# Patient Record
Sex: Female | Born: 1935 | Race: White | Hispanic: No | Marital: Married | Smoking: Never smoker
Health system: Southern US, Community
[De-identification: ages and names within clinical notes are randomized; demographics above are authoritative.]

## PROBLEM LIST (undated history)

## (undated) DIAGNOSIS — B351 Tinea unguium: Secondary | ICD-10-CM

## (undated) DIAGNOSIS — E785 Hyperlipidemia, unspecified: Secondary | ICD-10-CM

## (undated) DIAGNOSIS — F0390 Unspecified dementia without behavioral disturbance: Secondary | ICD-10-CM

## (undated) DIAGNOSIS — E039 Hypothyroidism, unspecified: Secondary | ICD-10-CM

## (undated) DIAGNOSIS — K5730 Diverticulosis of large intestine without perforation or abscess without bleeding: Secondary | ICD-10-CM

## (undated) DIAGNOSIS — Z8781 Personal history of (healed) traumatic fracture: Secondary | ICD-10-CM

## (undated) DIAGNOSIS — L03116 Cellulitis of left lower limb: Secondary | ICD-10-CM

## (undated) DIAGNOSIS — G20A1 Parkinson's disease without dyskinesia, without mention of fluctuations: Secondary | ICD-10-CM

## (undated) DIAGNOSIS — I10 Essential (primary) hypertension: Secondary | ICD-10-CM

## (undated) DIAGNOSIS — R011 Cardiac murmur, unspecified: Secondary | ICD-10-CM

## (undated) DIAGNOSIS — G20 Parkinson's disease: Secondary | ICD-10-CM

## (undated) HISTORY — DX: Diverticulosis of large intestine without perforation or abscess without bleeding: K57.30

## (undated) HISTORY — DX: Hyperlipidemia, unspecified: E78.5

## (undated) HISTORY — PX: ABDOMINAL HYSTERECTOMY: SHX81

## (undated) HISTORY — DX: Cellulitis of left lower limb: L03.116

## (undated) HISTORY — DX: Parkinson's disease without dyskinesia, without mention of fluctuations: G20.A1

## (undated) HISTORY — DX: Hypothyroidism, unspecified: E03.9

## (undated) HISTORY — DX: Essential (primary) hypertension: I10

## (undated) HISTORY — DX: Cardiac murmur, unspecified: R01.1

## (undated) HISTORY — DX: Tinea unguium: B35.1

## (undated) HISTORY — DX: Parkinson's disease: G20

---

## 1998-08-10 ENCOUNTER — Other Ambulatory Visit: Admission: RE | Admit: 1998-08-10 | Discharge: 1998-08-10 | Payer: Self-pay | Admitting: Obstetrics & Gynecology

## 1999-08-11 ENCOUNTER — Other Ambulatory Visit: Admission: RE | Admit: 1999-08-11 | Discharge: 1999-08-11 | Payer: Self-pay | Admitting: Obstetrics & Gynecology

## 2001-11-03 ENCOUNTER — Other Ambulatory Visit: Admission: RE | Admit: 2001-11-03 | Discharge: 2001-11-03 | Payer: Self-pay | Admitting: Obstetrics & Gynecology

## 2002-12-02 ENCOUNTER — Other Ambulatory Visit: Admission: RE | Admit: 2002-12-02 | Discharge: 2002-12-02 | Payer: Self-pay | Admitting: Obstetrics & Gynecology

## 2002-12-07 ENCOUNTER — Encounter: Payer: Self-pay | Admitting: Internal Medicine

## 2003-03-23 ENCOUNTER — Encounter: Admission: RE | Admit: 2003-03-23 | Discharge: 2003-03-23 | Payer: Self-pay | Admitting: Internal Medicine

## 2003-04-08 ENCOUNTER — Encounter: Admission: RE | Admit: 2003-04-08 | Discharge: 2003-04-08 | Payer: Self-pay | Admitting: Internal Medicine

## 2004-01-26 ENCOUNTER — Ambulatory Visit: Payer: Self-pay | Admitting: Internal Medicine

## 2004-02-22 ENCOUNTER — Ambulatory Visit: Payer: Self-pay | Admitting: Internal Medicine

## 2004-06-22 ENCOUNTER — Ambulatory Visit: Payer: Self-pay | Admitting: Internal Medicine

## 2004-07-12 ENCOUNTER — Ambulatory Visit: Payer: Self-pay | Admitting: Internal Medicine

## 2004-07-27 ENCOUNTER — Ambulatory Visit: Payer: Self-pay | Admitting: Internal Medicine

## 2004-08-03 ENCOUNTER — Ambulatory Visit: Payer: Self-pay | Admitting: Internal Medicine

## 2004-08-08 ENCOUNTER — Encounter (INDEPENDENT_AMBULATORY_CARE_PROVIDER_SITE_OTHER): Payer: Self-pay | Admitting: *Deleted

## 2004-08-08 ENCOUNTER — Ambulatory Visit: Payer: Self-pay | Admitting: Internal Medicine

## 2004-08-08 LAB — HM COLONOSCOPY

## 2004-08-10 ENCOUNTER — Ambulatory Visit: Payer: Self-pay | Admitting: Internal Medicine

## 2004-10-03 ENCOUNTER — Ambulatory Visit: Payer: Self-pay | Admitting: Internal Medicine

## 2004-12-21 ENCOUNTER — Ambulatory Visit: Payer: Self-pay | Admitting: Internal Medicine

## 2004-12-28 ENCOUNTER — Other Ambulatory Visit: Admission: RE | Admit: 2004-12-28 | Discharge: 2004-12-28 | Payer: Self-pay | Admitting: Obstetrics & Gynecology

## 2005-01-25 ENCOUNTER — Ambulatory Visit: Payer: Self-pay | Admitting: Internal Medicine

## 2005-04-24 ENCOUNTER — Ambulatory Visit: Payer: Self-pay | Admitting: Internal Medicine

## 2005-06-06 ENCOUNTER — Ambulatory Visit: Payer: Self-pay | Admitting: Internal Medicine

## 2005-06-14 ENCOUNTER — Ambulatory Visit: Payer: Self-pay | Admitting: Internal Medicine

## 2005-09-05 ENCOUNTER — Ambulatory Visit: Payer: Self-pay | Admitting: Internal Medicine

## 2005-12-12 ENCOUNTER — Ambulatory Visit: Payer: Self-pay | Admitting: Internal Medicine

## 2005-12-12 DIAGNOSIS — K5730 Diverticulosis of large intestine without perforation or abscess without bleeding: Secondary | ICD-10-CM | POA: Insufficient documentation

## 2005-12-12 DIAGNOSIS — E785 Hyperlipidemia, unspecified: Secondary | ICD-10-CM

## 2005-12-12 DIAGNOSIS — E039 Hypothyroidism, unspecified: Secondary | ICD-10-CM

## 2005-12-12 DIAGNOSIS — I10 Essential (primary) hypertension: Secondary | ICD-10-CM | POA: Insufficient documentation

## 2006-04-15 ENCOUNTER — Ambulatory Visit: Payer: Self-pay | Admitting: Internal Medicine

## 2006-08-12 ENCOUNTER — Ambulatory Visit: Payer: Self-pay | Admitting: Internal Medicine

## 2006-08-12 LAB — CONVERTED CEMR LAB
ALT: 14 units/L (ref 0–40)
AST: 23 units/L (ref 0–37)
Albumin: 3.6 g/dL (ref 3.5–5.2)
Alkaline Phosphatase: 46 units/L (ref 39–117)
BUN: 11 mg/dL (ref 6–23)
Bilirubin, Direct: 0.1 mg/dL (ref 0.0–0.3)
CO2: 30 meq/L (ref 19–32)
Calcium: 9.5 mg/dL (ref 8.4–10.5)
Chloride: 93 meq/L — ABNORMAL LOW (ref 96–112)
Creatinine, Ser: 0.7 mg/dL (ref 0.4–1.2)
GFR calc Af Amer: 103 mL/min
GFR calc non Af Amer: 85 mL/min
Glucose, Bld: 76 mg/dL (ref 70–99)
Potassium: 3.5 meq/L (ref 3.5–5.1)
Sodium: 130 meq/L — ABNORMAL LOW (ref 135–145)
TSH: 3.36 microintl units/mL (ref 0.35–5.50)
Total Bilirubin: 0.9 mg/dL (ref 0.3–1.2)
Total Protein: 6.3 g/dL (ref 6.0–8.3)

## 2007-02-24 ENCOUNTER — Ambulatory Visit: Payer: Self-pay | Admitting: Internal Medicine

## 2007-02-25 LAB — CONVERTED CEMR LAB
BUN: 14 mg/dL (ref 6–23)
CO2: 31 meq/L (ref 19–32)
Calcium: 10.2 mg/dL (ref 8.4–10.5)
Chloride: 95 meq/L — ABNORMAL LOW (ref 96–112)
Cholesterol: 258 mg/dL (ref 0–200)
Creatinine, Ser: 1 mg/dL (ref 0.4–1.2)
Direct LDL: 175.8 mg/dL
GFR calc Af Amer: 68 mL/min
GFR calc non Af Amer: 56 mL/min
Glucose, Bld: 86 mg/dL (ref 70–99)
HDL: 64.6 mg/dL (ref >39.0)
Potassium: 4.2 meq/L (ref 3.5–5.1)
Sodium: 134 meq/L — ABNORMAL LOW (ref 135–145)
TSH: 3.84 microintl units/mL (ref 0.35–5.50)
Total CHOL/HDL Ratio: 4
Triglycerides: 106 mg/dL (ref 0–149)
VLDL: 21 mg/dL (ref 0–40)

## 2007-03-27 ENCOUNTER — Ambulatory Visit: Payer: Self-pay | Admitting: Internal Medicine

## 2007-04-14 ENCOUNTER — Telehealth: Payer: Self-pay | Admitting: Internal Medicine

## 2007-08-20 ENCOUNTER — Ambulatory Visit: Payer: Self-pay | Admitting: Internal Medicine

## 2007-08-25 LAB — CONVERTED CEMR LAB: TSH: 7.29 microintl units/mL — ABNORMAL HIGH (ref 0.35–5.50)

## 2007-11-26 ENCOUNTER — Ambulatory Visit: Payer: Self-pay | Admitting: Internal Medicine

## 2007-11-26 LAB — CONVERTED CEMR LAB: TSH: 3.91 microintl units/mL (ref 0.35–5.50)

## 2008-02-25 ENCOUNTER — Ambulatory Visit: Payer: Self-pay | Admitting: Internal Medicine

## 2008-02-25 DIAGNOSIS — B351 Tinea unguium: Secondary | ICD-10-CM

## 2008-02-25 HISTORY — DX: Tinea unguium: B35.1

## 2008-04-30 ENCOUNTER — Telehealth: Payer: Self-pay | Admitting: Internal Medicine

## 2008-04-30 ENCOUNTER — Emergency Department (HOSPITAL_COMMUNITY): Admission: EM | Admit: 2008-04-30 | Discharge: 2008-04-30 | Payer: Self-pay | Admitting: Emergency Medicine

## 2008-05-03 ENCOUNTER — Ambulatory Visit: Payer: Self-pay | Admitting: Internal Medicine

## 2008-05-03 DIAGNOSIS — R269 Unspecified abnormalities of gait and mobility: Secondary | ICD-10-CM

## 2008-05-03 DIAGNOSIS — E876 Hypokalemia: Secondary | ICD-10-CM

## 2008-05-07 ENCOUNTER — Encounter: Admission: RE | Admit: 2008-05-07 | Discharge: 2008-05-07 | Payer: Self-pay | Admitting: Internal Medicine

## 2008-05-26 ENCOUNTER — Ambulatory Visit: Payer: Self-pay | Admitting: Internal Medicine

## 2008-05-27 LAB — CONVERTED CEMR LAB
BUN: 14 mg/dL (ref 6–23)
CO2: 33 meq/L — ABNORMAL HIGH (ref 19–32)
Calcium: 9.4 mg/dL (ref 8.4–10.5)
Chloride: 94 meq/L — ABNORMAL LOW (ref 96–112)
Creatinine, Ser: 0.8 mg/dL (ref 0.4–1.2)
GFR calc non Af Amer: 72.51 mL/min (ref >60)
Glucose, Bld: 86 mg/dL (ref 70–99)
Potassium: 4.1 meq/L (ref 3.5–5.1)
Sodium: 133 meq/L — ABNORMAL LOW (ref 135–145)
TSH: 2.93 microintl units/mL (ref 0.35–5.50)

## 2008-05-28 ENCOUNTER — Encounter: Payer: Self-pay | Admitting: Internal Medicine

## 2008-06-14 ENCOUNTER — Telehealth: Payer: Self-pay | Admitting: Internal Medicine

## 2008-06-21 ENCOUNTER — Telehealth: Payer: Self-pay | Admitting: Internal Medicine

## 2008-06-23 ENCOUNTER — Telehealth: Payer: Self-pay | Admitting: Family Medicine

## 2008-06-24 ENCOUNTER — Ambulatory Visit: Payer: Self-pay | Admitting: Internal Medicine

## 2008-06-24 ENCOUNTER — Inpatient Hospital Stay (HOSPITAL_COMMUNITY): Admission: EM | Admit: 2008-06-24 | Discharge: 2008-06-26 | Payer: Self-pay | Admitting: Emergency Medicine

## 2008-06-24 ENCOUNTER — Encounter (INDEPENDENT_AMBULATORY_CARE_PROVIDER_SITE_OTHER): Payer: Self-pay | Admitting: Internal Medicine

## 2008-07-06 ENCOUNTER — Telehealth: Payer: Self-pay | Admitting: Internal Medicine

## 2008-07-08 ENCOUNTER — Inpatient Hospital Stay (HOSPITAL_COMMUNITY): Admission: EM | Admit: 2008-07-08 | Discharge: 2008-07-10 | Payer: Self-pay | Admitting: Emergency Medicine

## 2008-08-05 ENCOUNTER — Telehealth: Payer: Self-pay | Admitting: Internal Medicine

## 2008-08-07 ENCOUNTER — Ambulatory Visit: Payer: Self-pay | Admitting: Internal Medicine

## 2008-08-07 ENCOUNTER — Observation Stay (HOSPITAL_COMMUNITY): Admission: EM | Admit: 2008-08-07 | Discharge: 2008-08-09 | Payer: Self-pay | Admitting: Emergency Medicine

## 2008-08-07 ENCOUNTER — Encounter: Payer: Self-pay | Admitting: Internal Medicine

## 2008-08-07 DIAGNOSIS — F333 Major depressive disorder, recurrent, severe with psychotic symptoms: Secondary | ICD-10-CM | POA: Insufficient documentation

## 2008-08-29 ENCOUNTER — Ambulatory Visit: Payer: Self-pay | Admitting: Internal Medicine

## 2008-09-06 ENCOUNTER — Encounter: Payer: Self-pay | Admitting: Internal Medicine

## 2009-01-17 ENCOUNTER — Telehealth: Payer: Self-pay | Admitting: Internal Medicine

## 2009-02-11 ENCOUNTER — Telehealth: Payer: Self-pay | Admitting: Internal Medicine

## 2009-06-03 ENCOUNTER — Ambulatory Visit: Payer: Self-pay | Admitting: Internal Medicine

## 2009-06-03 DIAGNOSIS — G20 Parkinson's disease: Secondary | ICD-10-CM

## 2009-06-03 DIAGNOSIS — G20A1 Parkinson's disease without dyskinesia, without mention of fluctuations: Secondary | ICD-10-CM | POA: Insufficient documentation

## 2009-06-06 ENCOUNTER — Telehealth: Payer: Self-pay | Admitting: Internal Medicine

## 2009-06-06 LAB — CONVERTED CEMR LAB
BUN: 17 mg/dL (ref 6–23)
CO2: 31 meq/L (ref 19–32)
Calcium: 9.4 mg/dL (ref 8.4–10.5)
Chloride: 104 meq/L (ref 96–112)
Creatinine, Ser: 0.9 mg/dL (ref 0.4–1.2)
GFR calc non Af Amer: 63.14 mL/min (ref >60)
Glucose, Bld: 90 mg/dL (ref 70–99)
Potassium: 4 meq/L (ref 3.5–5.1)
Sodium: 143 meq/L (ref 135–145)
TSH: 4.25 microintl units/mL (ref 0.35–5.50)

## 2009-06-07 ENCOUNTER — Telehealth: Payer: Self-pay | Admitting: Internal Medicine

## 2009-06-29 ENCOUNTER — Ambulatory Visit: Payer: Self-pay | Admitting: Internal Medicine

## 2009-08-02 ENCOUNTER — Ambulatory Visit: Payer: Self-pay | Admitting: Internal Medicine

## 2009-12-06 ENCOUNTER — Ambulatory Visit: Payer: Self-pay | Admitting: Internal Medicine

## 2010-03-21 ENCOUNTER — Telehealth: Payer: Self-pay | Admitting: Internal Medicine

## 2010-04-01 ENCOUNTER — Encounter: Payer: Self-pay | Admitting: Internal Medicine

## 2010-04-11 NOTE — Assessment & Plan Note (Signed)
Summary: 4 month fup//ccm rsc bmp ok hus/njr   Vital Signs:  Patient profile:   77 year old female Weight:      130 pounds Temp:     98.5 degrees F oral Pulse rate:   80 / minute Pulse rhythm:   regular Resp:     16 per minute BP sitting:   178 / 76  Vitals Entered By: Lynann Beaver CMA (December 06, 2009 11:37 AM)  Serial Vital Signs/Assessments:  Time      Position  BP       Pulse  Resp  Temp     By                     145/70                         Birdie Sons MD  CC: rov Is Patient Diabetic? No Pain Assessment Patient in pain? no        CC:  rov.  History of Present Illness:  Follow-Up Visit      This is an 77 year old woman who presents for Follow-up visit.  The patient denies chest pain and palpitations.  Since the last visit the patient notes no new problems or concerns.  The patient reports taking meds as prescribed and not monitoring BP.  When questioned about possible medication side effects, the patient notes none.    All other systems reviewed and were negative   Current Medications (verified): 1)  Lisinopril 40 Mg Tabs (Lisinopril) .Marland Kitchen.. 1 By Mouth Once Daily 2)  Estrace 2 Mg  Tabs (Estradiol) .... One Tab Daily 3)  Levothroid 50 Mcg  Tabs (Levothyroxine Sodium) .... One By Mouth Daily  Allergies (verified): 1)  ! Citalopram Hydrobromide (Citalopram Hydrobromide)  Past History:  Past Medical History: Last updated: 08/02/2009 Diverticulosis, colon Hyperlipidemia Hypertension Hypothyroidism parkinsons  Past Surgical History: Last updated: 08/14/2006 Hysterectomy  Family History: Last updated: 2007/04/21 father deceased cancer prostate-86 yo mother deceased stroke at 40 yo  Social History: Last updated: 02/24/2007 Married Never Smoked Regular exercise-no  Risk Factors: Exercise: no (02/24/2007)  Risk Factors: Smoking Status: never (08/02/2009)  Physical Exam  General:  alert and well-developed.   Head:  normocephalic and  atraumatic.   Eyes:  pupils equal and pupils round.   Neck:  supple and full ROM.   Chest Wall:  No deformities, masses, or tenderness noted. Lungs:  normal respiratory effort and no intercostal retractions.   Heart:  normal rate and regular rhythm.   Abdomen:  soft and non-tender.   Msk:  No deformity or scoliosis noted of thoracic or lumbar spine.   Skin:  turgor normal and color normal.   Psych:  good eye contact and not anxious appearing.     Impression & Recommendations:  Problem # 1:  PARKINSON'S DISEASE (ICD-332.0) discussed potential treatment she and family members present are not interested at this time (concerned with potential side effects)  Problem # 2:  HYPOTHYROIDISM (ICD-244.9)  Her updated medication list for this problem includes:    Levothroid 50 Mcg Tabs (Levothyroxine sodium) ..... One by mouth daily  Labs Reviewed: TSH: 4.25 (06/03/2009)    Chol: 258 (02/24/2007)   HDL: 64.6 (02/24/2007)   LDL: DEL (02/24/2007)   TG: 106 (02/24/2007)  Problem # 3:  HYPERTENSION (ICD-401.9) see serial assessment Her updated medication list for this problem includes:    Lisinopril 40 Mg Tabs (Lisinopril) .Marland KitchenMarland KitchenMarland KitchenMarland Kitchen 1  by mouth once daily  BP today: 178/76 Prior BP: 140/62 (08/02/2009)  Labs Reviewed: K+: 4.0 (06/03/2009) Creat: : 0.9 (06/03/2009)   Chol: 258 (02/24/2007)   HDL: 64.6 (02/24/2007)   LDL: DEL (02/24/2007)   TG: 106 (02/24/2007)  Complete Medication List: 1)  Lisinopril 40 Mg Tabs (Lisinopril) .Marland Kitchen.. 1 by mouth once daily 2)  Estrace 2 Mg Tabs (Estradiol) .... One tab daily 3)  Levothroid 50 Mcg Tabs (Levothyroxine sodium) .... One by mouth daily  Other Orders: Flu Vaccine 22yrs + MEDICARE PATIENTS (Z6109) Administration Flu vaccine - MCR (G0008) Pneumococcal Vaccine (60454) Admin 1st Vaccine (09811)  Patient Instructions: 1)  Please schedule a follow-up appointment in 6 months. Flu Vaccine Consent Questions     Do you have a history of severe allergic  reactions to this vaccine? no    Any prior history of allergic reactions to egg and/or gelatin? no    Do you have a sensitivity to the preservative Thimersol? no    Do you have a past history of Guillan-Barre Syndrome? no    Do you currently have an acute febrile illness? no    Have you ever had a severe reaction to latex? no    Vaccine information given and explained to patient? yes    Are you currently pregnant? no    Lot Number:[id number]   Exp Date:09/09/2010   Site Given  Left Deltoid IM  Immunizations Administered:  Pneumonia Vaccine:    Vaccine Type: Pneumovax    Site: right deltoid    Mfr: Merck    Dose: 0.5 ml    Route: IM    Given by: Lynann Beaver CMA    Exp. Date: 04/02/2011    Lot #: [id number]    .lbmedflu

## 2010-04-11 NOTE — Assessment & Plan Note (Signed)
Summary: 2 month follow up/cjr   Vital Signs:  Patient profile:   77 year old female Height:      64 inches Weight:      128 pounds BMI:     22.05 Pulse rate:   64 / minute Pulse rhythm:   regular Resp:     14 per minute BP sitting:   140 / 62  (left arm) Cuff size:   regular  Vitals Entered By: Gladis Riffle, RN (Aug 02, 2009 10:59 AM) CC: 2 month rov Is Patient Diabetic? No   CC:  2 month rov.  History of Present Illness:  Follow-Up Visit      This is an 77 year old woman who presents for Follow-up visit.  The patient denies chest pain and palpitations.  Since the last visit the patient notes no new problems or concerns, e xcept she reports difficulty walking, she feels unbalanced frequently, states that she has to walk slowly and states that she feels more comfortable if she "is haging on to something" when walking.  The patient reports taking meds as prescribed.  When questioned about possible medication side effects, the patient notes none.    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 1 By Mouth Once Daily 2)  Estrace 2 Mg  Tabs (Estradiol) .... One Tab Daily 3)  Levothroid 50 Mcg  Tabs (Levothyroxine Sodium) .... One By Mouth Daily  Allergies: 1)  ! Citalopram Hydrobromide (Citalopram Hydrobromide)  Past History:  Past Medical History: Diverticulosis,  colon Hyperlipidemia Hypertension Hypothyroidism parkinsons   Impression & Recommendations:  Problem # 1:  PARKINSON'S DISEASE (ICD-332.0)  discussed with pt and family no treatment at this time---pt and family preference (concern with side effects)  Orders: Prescription Created Electronically [id number])  Problem # 2:  HYPOTHYROIDISM (ICD-244.9)  controlled continue current medications  Her updated medication list for this problem includes:    Levothroid 50 Mcg Tabs (Levothyroxine sodium) ..... One by mouth daily  Labs Reviewed: TSH: 4.25 (06/03/2009)    Chol: 258 (02/24/2007)   HDL: 64.6 (02/24/2007)   LDL: DEL (02/24/2007)   TG: 106 (02/24/2007)  Orders: Prescription Created Electronically [phone])  Problem # 3:  HYPERLIPIDEMIA (ICD-272.4)  no need for f/u  Orders: Prescription Created Electronically [phone])  Complete Medication List: 1)  Lisinopril 40 Mg Tabs (Lisinopril) .Marland Kitchen.. 1 by mouth once daily 2)  Estrace 2 Mg Tabs (Estradiol) .... One tab daily 3)  Levothroid 50 Mcg Tabs (Levothyroxine sodium) .... One by mouth daily  Patient Instructions: 1)  Please schedule a follow-up appointment in 4 months. Prescriptions: LISINOPRIL 40 MG TABS (LISINOPRIL) 1 by mouth once daily  #90 x 3   Entered and Authorized by:   Birdie Sons MD   Signed by:   Birdie Sons MD on 08/02/2009   Method used:   [facility] [phone][facility] (retail)       [address]  [place], [place]  [address]       Ph: [phone] or [phone]       Fax: [phone]   RxID:   [id number] LEVOTHROID 50 MCG  TABS (LEVOTHYROXINE SODIUM) one by mouth daily  #90 x 3   Entered and Authorized by:   Birdie Sons MD   Signed by:   Birdie Sons MD on 08/02/2009   Method used:   [facility]        [facility]  [address] [phone]* (retail)       [address]       [place], [place]  [address]       Ph:  [phone] or [phone]       Fax: [phone]   RxID:   [id number]

## 2010-04-11 NOTE — Assessment & Plan Note (Signed)
Summary: FUP//CCM   Vital Signs:  Patient profile:   77 year old female Height:      64 inches Weight:      129 pounds Temp:     97.8 degrees F oral Pulse rate:   78 / minute Pulse rhythm:   regular BP sitting:   120 / 64  (left arm) Cuff size:   regular  Vitals Entered By: Kern Reap CMA Duncan Dull) (June 03, 2009 11:05 AM) CC: follow-up visit Is Patient Diabetic? No Pain Assessment Patient in pain? no        CC:  follow-up visit.  History of Present Illness: comes in with husband and granddaughter pt states that she doesn't walk much---says she is in no pain but feels like her legs wil give out. does not go outside---fear of falling.  Appetite-normal She is able to get dressed on her own Husband is doing the cooking because she complains of leg weakness.  Husband states that she has lost interest in "everything" , doesn't watch TV or do cross words, doesn't crochet any more.   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 1 By Mouth Qd 2)  Lisinopril 40 Mg Tabs (Lisinopril) .Marland Kitchen.. 1 By Mouth Once Daily--Will Need Office Visit in March 3)  Estrace 2 Mg  Tabs (Estradiol) .... One Tab Daily 4)  Levothroid 50 Mcg  Tabs (Levothyroxine Sodium) .... One By Mouth Daily 5)  Seroquel 25 Mg Tabs (Quetiapine Fumarate) .... Take One Tab At Bedtime  Allergies (verified): No Known Drug Allergies  Past History:  Past Medical History: Last updated: 12/12/2005 Diverticulosis, colon Hyperlipidemia Hypertension Hypothyroidism  Past  Surgical History: Last updated: 08/14/2006 Hysterectomy  Family History: Last updated: 2007-03-30 father deceased cancer prostate-86 yo mother deceased stroke at 44 yo  Social History: Last updated: 02/24/2007 Married Never Smoked Regular exercise-no  Risk Factors: Exercise: no (02/24/2007)  Risk Factors: Smoking Status: never (02/24/2007)  Physical Exam  General:  alert and well-developed.   Head:  normocephalic and atraumatic.   Eyes:  pupils equal and pupils round.   Ears:  R ear normal and L ear normal.   Nose:  no external deformity and no external erythema.   Neck:  supple and full ROM.   Chest Wall:  No deformities, masses, or tenderness noted. Lungs:  normal respiratory effort, no dullness, and no wheezes.   Abdomen:  soft and non-tender.   Msk:  no joint swelling, no joint warmth, and no redness over joints.   Extremities:  trace left pedal edema and trace right pedal edema.     Impression & Recommendations:  Problem # 1:  PARKINSON'S DISEASE (ICD-332.0) no meds---she has refused  Problem # 2:  MAJ DPRSV D/O RECUR EPIS SEV SPEC W/PSYCHOT BHV (ICD-296.34) change meds side effects discussed see new meds  Problem # 3:  GAIT DISTURBANCE (ICD-781.2) home PT if possible Orders: Physical Therapy Referral (PT)  Problem # 4:  HYPOTHYROIDISM (ICD-244.9) check labs Her updated medication list for this problem includes:    Levothroid 50 Mcg Tabs (Levothyroxine sodium) ..... One  by mouth daily  Labs Reviewed: TSH: 2.93 (05/26/2008)    Chol: 258 (02/24/2007)   HDL: 64.6 (02/24/2007)   LDL: DEL (02/24/2007)   TG: 106 (02/24/2007)  Orders: TLB-TSH (Thyroid Stimulating Hormone) (84443-TSH)  Problem # 5:  HYPERTENSION (ICD-401.9) clinically stable The following medications were removed from the medication list:    Plendil 5 Mg Tb24 (Felodipine) .Marland Kitchen... 1 by mouth qd Her updated medication list for this problem includes:    Lisinopril 40 Mg Tabs (Lisinopril)  .Marland Kitchen... 1 by mouth once daily--will need office visit in march  BP today: 120/64 Prior BP: 106/70 (08/07/2008)  Labs Reviewed: K+: 4.1 (05/26/2008) Creat: : 0.8 (05/26/2008)   Chol: 258 (02/24/2007)   HDL: 64.6 (02/24/2007)   LDL: DEL (02/24/2007)   TG: 106 (02/24/2007)  Orders: Venipuncture (63016) TLB-BMP (Basic Metabolic Panel-BMET) (80048-METABOL)  Problem # 6:  HYPERLIPIDEMIA (ICD-272.4) doubtful we would treat so will not check  Labs Reviewed: SGOT: 23 (08/12/2006)   SGPT: 14 (08/12/2006)   HDL:64.6 (02/24/2007)  LDL:DEL (02/24/2007)  Chol:258 (02/24/2007)  Trig:106 (02/24/2007)  Complete Medication List: 1)  Lisinopril 40 Mg Tabs (Lisinopril) .Marland Kitchen.. 1 by mouth once daily--will need office visit in march 2)  Estrace 2 Mg Tabs (Estradiol) .... One tab daily 3)  Levothroid 50 Mcg Tabs (Levothyroxine sodium) .... One by mouth daily 4)  Citalopram Hydrobromide 20 Mg Tabs (Citalopram hydrobromide) .... Take one tablet by mouth daily  Patient Instructions: 1)  Please schedule a follow-up appointment in 2 months. Prescriptions: LEVOTHROID 50 MCG  TABS (LEVOTHYROXINE SODIUM) one by mouth daily  #90 x 3   Entered and Authorized by:   Birdie Sons MD   Signed by:   Birdie Sons MD on 06/03/2009   Method used:   [facility]        [facility] [address] [phone]* (retail)       [address]       [place], [place]  [id number]       Ph: [phone] or [phone]       Fax: [phone]   RxID:   [id number] LISINOPRIL 40 MG TABS (LISINOPRIL) 1 by mouth once daily--will need office visit in March  #90 x 3   Entered and Authorized by:   Birdie Sons MD   Signed by:   Birdie Sons MD on 06/03/2009   Method used:   [facility]        [facility] [address] [phone]* (retail)       [address]       [place], [place]  [address]       Ph: [phone] or [phone]       Fax: [phone]   RxID:    [id number] CITALOPRAM HYDROBROMIDE 20 MG TABS (CITALOPRAM HYDROBROMIDE) take one tablet by mouth daily  #90 x 3   Entered and Authorized by:   Birdie Sons MD   Signed by:   Birdie Sons MD on 06/03/2009   Method used:   [facility]        [facility] [phone]* (retail)       [address]       [place], [place]  [address]       Ph: [phone] or [phone]       Fax: [phone]   RxID:   [id number]

## 2010-04-11 NOTE — Progress Notes (Signed)
Summary: med reaction  Phone Note Call from Patient   Caller: grandaughter Call For: Tammy Branch Summary of Call: Launa Flight (grandaughter) really needs Dr. Cato Mulligan to completely DC the Citalopram as pt becomes agitated and hallucinates everytime she is on any mind altering drugs.  [phone] Initial call taken by: Lynann Beaver CMA,  June 07, 2009 9:53 AM  Follow-up for Phone Call        dc meds Follow-up by: Tammy Branch,  June 07, 2009 9:56 AM  Additional Follow-up for Phone Call Additional follow up Details #1::        granddaughter, shannon, notified. Additional Follow-up by: Gladis Riffle, RN,  June 07, 2009 9:58 AM   New Allergies: ! CITALOPRAM HYDROBROMIDE (CITALOPRAM HYDROBROMIDE) New Allergies: ! CITALOPRAM HYDROBROMIDE (CITALOPRAM HYDROBROMIDE)

## 2010-04-11 NOTE — Progress Notes (Signed)
Summary: Medication issue  Phone Note Call from Patient Call back at [phone]   Caller: Daughter Reason for Call: Talk to Nurse Summary of Call: Patient not doing well with depression medication - she is very aggitated. Initial call taken by: Everrett Coombe,  June 06, 2009 2:01 PM  Follow-up for Phone Call        decrease citalopram to 1/2 current dose Follow-up by: Birdie Sons MD,  June 06, 2009 3:32 PM     Appended Document: Medication issue uanble to reach pt, answering machine not working.  See phone note of 06/07/09.  Med has been dcd and granddaughter, shannon, notified.

## 2010-04-11 NOTE — Miscellaneous (Signed)
Summary: Certification and Plan of Care/[facility]  Certification and Plan of Care/[facility]   Imported By: Maryln Gottron 06/30/2009 13:26:15  _____________________________________________________________________  External Attachment:    Type:   Image     Comment:   External Document

## 2010-04-13 NOTE — Progress Notes (Signed)
Summary: Levothroid on Back Order, Needs Alternative ASAP  Phone Note Call from Patient Call back at [phone]   Caller: Daughter [facility] Summary of Call: Levothroid is on  back order pt only has 3 days supply left.  Please call in alternative to take, [facility]. Initial call taken by: Trixie Dredge,  March 21, 2010 4:02 PM  Follow-up for Phone Call        pharmacy can use generic Follow-up by: Birdie Sons MD,  March 21, 2010 10:04 PM  Additional Follow-up for Phone Call Additional follow up Details #1::        Left message for Carollee Herter on her personal voice mail. Additional Follow-up by: Lynann Beaver CMA AAMA,  March 22, 2010 8:27 AM

## 2010-05-27 ENCOUNTER — Other Ambulatory Visit: Payer: Self-pay | Admitting: Internal Medicine

## 2010-06-05 ENCOUNTER — Encounter: Payer: Self-pay | Admitting: Internal Medicine

## 2010-06-06 ENCOUNTER — Ambulatory Visit (INDEPENDENT_AMBULATORY_CARE_PROVIDER_SITE_OTHER): Payer: Medicare Other | Admitting: Internal Medicine

## 2010-06-06 ENCOUNTER — Encounter: Payer: Self-pay | Admitting: Internal Medicine

## 2010-06-06 DIAGNOSIS — I10 Essential (primary) hypertension: Secondary | ICD-10-CM

## 2010-06-06 DIAGNOSIS — E785 Hyperlipidemia, unspecified: Secondary | ICD-10-CM

## 2010-06-06 DIAGNOSIS — E039 Hypothyroidism, unspecified: Secondary | ICD-10-CM

## 2010-06-06 DIAGNOSIS — G20 Parkinson's disease: Secondary | ICD-10-CM

## 2010-06-06 LAB — BASIC METABOLIC PANEL
BUN: 19 mg/dL (ref 6–23)
CO2: 31 mEq/L (ref 19–32)
Chloride: 108 mEq/L (ref 96–112)
Creatinine, Ser: 1.1 mg/dL (ref 0.4–1.2)
GFR: 52.15 mL/min — ABNORMAL LOW (ref >60.00)
Glucose, Bld: 76 mg/dL (ref 70–99)
Potassium: 5.2 mEq/L — ABNORMAL HIGH (ref 3.5–5.1)
Sodium: 144 mEq/L (ref 135–145)

## 2010-06-06 LAB — TSH: TSH: 3.6 u[IU]/mL (ref 0.35–5.50)

## 2010-06-08 NOTE — Assessment & Plan Note (Signed)
I have offered neurologically consultation. At this time patient and family do not want to pursue that. I will, if they change their mind.

## 2010-06-08 NOTE — Assessment & Plan Note (Signed)
Blood pressure somewhat elevated here. I've asked her family to monitor blood pressure at home. We'll not make any medication changes at this time.

## 2010-06-08 NOTE — Progress Notes (Signed)
  Subjective:    Patient ID: Tammy Branch, female    DOB: Jul 04, 1921, 77 y.o.   MRN: [id number]  HPI  Patient comes in with family members for followup of multiple problems.  Patient has Parkinson's disease. His untreated at this time. It's been quite some time since she has seen a neurologist. She and her family are not interested in seeing a neurologist at this time.  Patient has hypothyroidism. Needs followup.  Patient with hypertension. She denies any chest pain, shortness of breath, return edema.  Past Medical History  Diagnosis Date  . Diverticulosis of colon   . Hyperlipidemia   . Hypertension   . Hypothyroidism   . Parkinson disease    Past Surgical History  Procedure Date  . Abdominal hysterectomy     reports that she has never smoked. She does not have any smokeless tobacco history on file. Her alcohol and drug histories not on file. family history includes Cancer in her father and Stroke in her mother. Allergies  Allergen Reactions  . Citalopram Hydrobromide     REACTION: hallucinations     Review of Systems    patient denies chest pain, shortness of breath, orthopnea. Denies lower extremity edema, abdominal pain, change in appetite, change in bowel movements. Patient denies rashes, musculoskeletal complaints. No other specific complaints in a complete review of systems.    Objective:   Physical Exam Elderly female in no acute distress. HEENT exam atraumatic, normocephalic, jugular venous distention is not apparent. Carotid upstrokes are normal. She has flat facies. Chest clear to auscultation cardiac exam S1 and S2 are regular. Abdominal exam thin, and bowel sounds, soft. Extremities no edema. Neurologic exam she is alert. She walks with a broad-based somewhat shuffling gait. She has mild cogwheeling of her upper extremities.       Assessment & Plan:

## 2010-06-08 NOTE — Assessment & Plan Note (Signed)
Tolerating replacement therapy Check labs today 

## 2010-06-20 LAB — COMPREHENSIVE METABOLIC PANEL WITH GFR
ALT: 13 U/L (ref 0–35)
AST: 24 U/L (ref 0–37)
Albumin: 3.1 g/dL — ABNORMAL LOW (ref 3.5–5.2)
Alkaline Phosphatase: 63 U/L (ref 39–117)
BUN: 17 mg/dL (ref 6–23)
CO2: 18 meq/L — ABNORMAL LOW (ref 19–32)
Calcium: 8.6 mg/dL (ref 8.4–10.5)
Chloride: 110 meq/L (ref 96–112)
Creatinine, Ser: 1.07 mg/dL (ref 0.4–1.2)
GFR calc non Af Amer: 49 mL/min — ABNORMAL LOW
Glucose, Bld: 73 mg/dL (ref 70–99)
Potassium: 3.7 meq/L (ref 3.5–5.1)
Sodium: 138 meq/L (ref 135–145)
Total Bilirubin: 1.4 mg/dL — ABNORMAL HIGH (ref 0.3–1.2)
Total Protein: 6.2 g/dL (ref 6.0–8.3)

## 2010-06-20 LAB — URINALYSIS, ROUTINE W REFLEX MICROSCOPIC
Glucose, UA: NEGATIVE mg/dL
Ketones, ur: >80 mg/dL — AB
Leukocytes, UA: NEGATIVE
Nitrite: NEGATIVE
Protein, ur: 30 mg/dL — AB
Specific Gravity, Urine: 1.019 (ref 1.005–1.030)
Urobilinogen, UA: 1 mg/dL (ref 0.0–1.0)
pH: 5.5 (ref 5.0–8.0)

## 2010-06-20 LAB — CBC
HCT: 32.3 % — ABNORMAL LOW (ref 36.0–46.0)
Hemoglobin: 11.3 g/dL — ABNORMAL LOW (ref 12.0–15.0)
MCHC: 35 g/dL (ref 30.0–36.0)
MCV: 95.6 fL (ref 78.0–100.0)
Platelets: 171 10*3/uL (ref 150–400)
RBC: 3.38 MIL/uL — ABNORMAL LOW (ref 3.87–5.11)
RDW: 13.2 % (ref 11.5–15.5)
WBC: 4 10*3/uL (ref 4.0–10.5)

## 2010-06-20 LAB — BASIC METABOLIC PANEL
GFR calc Af Amer: >60 mL/min (ref >60)
GFR calc non Af Amer: >60 mL/min (ref >60)
Glucose, Bld: 86 mg/dL (ref 70–99)
Potassium: 3.4 mEq/L — ABNORMAL LOW (ref 3.5–5.1)
Sodium: 141 mEq/L (ref 135–145)

## 2010-06-20 LAB — BASIC METABOLIC PANEL WITH GFR
BUN: 21 mg/dL (ref 6–23)
CO2: 24 meq/L (ref 19–32)
Calcium: 7.7 mg/dL — ABNORMAL LOW (ref 8.4–10.5)
Chloride: 113 meq/L — ABNORMAL HIGH (ref 96–112)
Creatinine, Ser: 1.85 mg/dL — ABNORMAL HIGH (ref 0.4–1.2)
GFR calc non Af Amer: 26 mL/min — ABNORMAL LOW
Glucose, Bld: 85 mg/dL (ref 70–99)
Potassium: 4.3 meq/L (ref 3.5–5.1)
Sodium: 141 meq/L (ref 135–145)

## 2010-06-20 LAB — T3: T3, Total: 60.8 ng/dL — ABNORMAL LOW (ref 80.0–204.0)

## 2010-06-20 LAB — T4, FREE: Free T4: 1.14 ng/dL (ref 0.80–1.80)

## 2010-06-20 LAB — CK TOTAL AND CKMB (NOT AT ARMC)
CK, MB: 1.4 ng/mL (ref 0.3–4.0)
Relative Index: INVALID (ref 0.0–2.5)
Total CK: 34 U/L (ref 7–177)

## 2010-06-20 LAB — TSH: TSH: 0.618 u[IU]/mL (ref 0.350–4.500)

## 2010-06-20 LAB — DIFFERENTIAL
Basophils Relative: 0 % (ref 0–1)
Eosinophils Absolute: 0.1 10*3/uL (ref 0.0–0.7)
Neutrophils Relative %: 70 % (ref 43–77)

## 2010-06-20 LAB — PREALBUMIN: Prealbumin: 11.5 mg/dL — ABNORMAL LOW (ref 18.0–45.0)

## 2010-06-20 LAB — URINE MICROSCOPIC-ADD ON

## 2010-06-20 LAB — URINE CULTURE
Colony Count: NO GROWTH
Culture: NO GROWTH

## 2010-06-20 LAB — VITAMIN B12: Vitamin B-12: 473 pg/mL (ref 211–911)

## 2010-06-21 LAB — URINE CULTURE
Colony Count: >=100000
Colony Count: 50000
Special Requests: NEGATIVE

## 2010-06-21 LAB — DIFFERENTIAL
Basophils Absolute: 0 10*3/uL (ref 0.0–0.1)
Basophils Relative: 0 % (ref 0–1)
Eosinophils Absolute: 0.1 10*3/uL (ref 0.0–0.7)
Eosinophils Absolute: 0 10*3/uL (ref 0.0–0.7)
Eosinophils Relative: 1 % (ref 0–5)
Eosinophils Relative: 0 % (ref 0–5)
Lymphocytes Relative: 12 % (ref 12–46)
Lymphocytes Relative: 10 % — ABNORMAL LOW (ref 12–46)
Lymphs Abs: 0.8 10*3/uL (ref 0.7–4.0)
Lymphs Abs: 0.7 10*3/uL (ref 0.7–4.0)
Monocytes Absolute: 0.5 10*3/uL (ref 0.1–1.0)
Monocytes Relative: 6 % (ref 3–12)
Monocytes Relative: 6 % (ref 3–12)
Neutro Abs: 6.1 10*3/uL (ref 1.7–7.7)
Neutrophils Relative %: 81 % — ABNORMAL HIGH (ref 43–77)
Neutrophils Relative %: 83 % — ABNORMAL HIGH (ref 43–77)

## 2010-06-21 LAB — COMPREHENSIVE METABOLIC PANEL
ALT: 13 U/L (ref 0–35)
ALT: 17 U/L (ref 0–35)
AST: 17 U/L (ref 0–37)
Albumin: 3.4 g/dL — ABNORMAL LOW (ref 3.5–5.2)
Alkaline Phosphatase: 52 U/L (ref 39–117)
BUN: 15 mg/dL (ref 6–23)
BUN: 19 mg/dL (ref 6–23)
Calcium: 9.1 mg/dL (ref 8.4–10.5)
Calcium: 8.9 mg/dL (ref 8.4–10.5)
Chloride: 100 mEq/L (ref 96–112)
Creatinine, Ser: 2.18 mg/dL — ABNORMAL HIGH (ref 0.4–1.2)
GFR calc Af Amer: 26 mL/min — ABNORMAL LOW (ref >60)
GFR calc non Af Amer: 21 mL/min — ABNORMAL LOW (ref >60)
Glucose, Bld: 114 mg/dL — ABNORMAL HIGH (ref 70–99)
Glucose, Bld: 173 mg/dL — ABNORMAL HIGH (ref 70–99)
Potassium: 2.9 mEq/L — ABNORMAL LOW (ref 3.5–5.1)
Sodium: 143 mEq/L (ref 135–145)
Sodium: 136 mEq/L (ref 135–145)
Sodium: 138 mEq/L (ref 135–145)
Total Bilirubin: 0.9 mg/dL (ref 0.3–1.2)
Total Protein: 6.4 g/dL (ref 6.0–8.3)
Total Protein: 5.6 g/dL — ABNORMAL LOW (ref 6.0–8.3)
Total Protein: 6.2 g/dL (ref 6.0–8.3)

## 2010-06-21 LAB — BASIC METABOLIC PANEL
BUN: 29 mg/dL — ABNORMAL HIGH (ref 6–23)
CO2: 23 mEq/L (ref 19–32)
Calcium: 8.1 mg/dL — ABNORMAL LOW (ref 8.4–10.5)
Creatinine, Ser: 1.99 mg/dL — ABNORMAL HIGH (ref 0.4–1.2)
GFR calc non Af Amer: 24 mL/min — ABNORMAL LOW (ref >60)
Glucose, Bld: 81 mg/dL (ref 70–99)

## 2010-06-21 LAB — CBC
HCT: 46.5 % — ABNORMAL HIGH (ref 36.0–46.0)
Hemoglobin: 15.1 g/dL — ABNORMAL HIGH (ref 12.0–15.0)
Hemoglobin: 15.8 g/dL — ABNORMAL HIGH (ref 12.0–15.0)
MCHC: 34.5 g/dL (ref 30.0–36.0)
MCHC: 35 g/dL (ref 30.0–36.0)
MCHC: 34.8 g/dL (ref 30.0–36.0)
MCHC: 34.9 g/dL (ref 30.0–36.0)
MCV: 97.3 fL (ref 78.0–100.0)
Platelets: 150 10*3/uL (ref 150–400)
Platelets: 165 10*3/uL (ref 150–400)
RBC: 4.72 MIL/uL (ref 3.87–5.11)
RDW: 11.7 % (ref 11.5–15.5)
RDW: 12.2 % (ref 11.5–15.5)
RDW: 12.2 % (ref 11.5–15.5)
RDW: 12.3 % (ref 11.5–15.5)
WBC: 5.5 10*3/uL (ref 4.0–10.5)
WBC: 7.4 10*3/uL (ref 4.0–10.5)

## 2010-06-21 LAB — CARDIAC PANEL(CRET KIN+CKTOT+MB+TROPI)
Relative Index: INVALID (ref 0.0–2.5)
Relative Index: INVALID (ref 0.0–2.5)
Total CK: 42 U/L (ref 7–177)
Troponin I: 0.01 ng/mL (ref 0.00–0.06)
Troponin I: <0.01 ng/mL (ref 0.00–0.06)

## 2010-06-21 LAB — URINALYSIS, ROUTINE W REFLEX MICROSCOPIC
Bilirubin Urine: NEGATIVE
Ketones, ur: 15 mg/dL — AB
Nitrite: NEGATIVE
Nitrite: NEGATIVE
Protein, ur: 30 mg/dL — AB
Specific Gravity, Urine: 1.012 (ref 1.005–1.030)
Urobilinogen, UA: 0.2 mg/dL (ref 0.0–1.0)
Urobilinogen, UA: 2 mg/dL — ABNORMAL HIGH (ref 0.0–1.0)
pH: 5.5 (ref 5.0–8.0)

## 2010-06-21 LAB — PROTIME-INR
INR: 1.2 (ref 0.00–1.49)
Prothrombin Time: 15.6 seconds — ABNORMAL HIGH (ref 11.6–15.2)

## 2010-06-21 LAB — URINE MICROSCOPIC-ADD ON

## 2010-06-21 LAB — LIPID PANEL
Cholesterol: 171 mg/dL (ref 0–200)
LDL Cholesterol: 110 mg/dL — ABNORMAL HIGH (ref 0–99)
Total CHOL/HDL Ratio: 3.6 RATIO
Triglycerides: 72 mg/dL (ref <150)

## 2010-06-21 LAB — FOLATE: Folate: >20 ng/mL

## 2010-06-21 LAB — VITAMIN B12: Vitamin B-12: 573 pg/mL (ref 211–911)

## 2010-06-21 LAB — CK TOTAL AND CKMB (NOT AT ARMC)
CK, MB: 1.4 ng/mL (ref 0.3–4.0)
CK, MB: 1.4 ng/mL (ref 0.3–4.0)
CK, MB: 1.5 ng/mL (ref 0.3–4.0)
CK, MB: 2.9 ng/mL (ref 0.3–4.0)
Relative Index: INVALID (ref 0.0–2.5)
Relative Index: INVALID (ref 0.0–2.5)
Relative Index: INVALID (ref 0.0–2.5)
Total CK: 16 U/L (ref 7–177)
Total CK: 22 U/L (ref 7–177)
Total CK: 35 U/L (ref 7–177)

## 2010-06-21 LAB — APTT: aPTT: 25 seconds (ref 24–37)

## 2010-06-21 LAB — T4, FREE: Free T4: 1.45 ng/dL (ref 0.80–1.80)

## 2010-06-21 LAB — TROPONIN I
Troponin I: 0.02 ng/mL (ref 0.00–0.06)
Troponin I: 0.04 ng/mL (ref 0.00–0.06)

## 2010-06-21 LAB — TSH: TSH: 2.543 u[IU]/mL (ref 0.350–4.500)

## 2010-06-21 LAB — HEMOGLOBIN A1C: Hgb A1c MFr Bld: 4.9 % (ref 4.6–6.1)

## 2010-06-27 LAB — URINALYSIS, ROUTINE W REFLEX MICROSCOPIC
Bilirubin Urine: NEGATIVE
Glucose, UA: NEGATIVE mg/dL
Hgb urine dipstick: NEGATIVE
Ketones, ur: NEGATIVE mg/dL
pH: 6.5 (ref 5.0–8.0)

## 2010-06-27 LAB — DIFFERENTIAL
Eosinophils Relative: 2 % (ref 0–5)
Lymphocytes Relative: 27 % (ref 12–46)
Lymphs Abs: 1.5 10*3/uL (ref 0.7–4.0)
Monocytes Absolute: 0.6 10*3/uL (ref 0.1–1.0)

## 2010-06-27 LAB — COMPREHENSIVE METABOLIC PANEL
AST: 25 U/L (ref 0–37)
Albumin: 3.4 g/dL — ABNORMAL LOW (ref 3.5–5.2)
Calcium: 9.3 mg/dL (ref 8.4–10.5)
Chloride: 99 mEq/L (ref 96–112)
Creatinine, Ser: 0.76 mg/dL (ref 0.4–1.2)
GFR calc Af Amer: >60 mL/min (ref >60)
Sodium: 133 mEq/L — ABNORMAL LOW (ref 135–145)

## 2010-06-27 LAB — CBC
MCV: 98.2 fL (ref 78.0–100.0)
Platelets: 190 10*3/uL (ref 150–400)
WBC: 5.8 10*3/uL (ref 4.0–10.5)

## 2010-07-25 NOTE — Discharge Summary (Signed)
NAMEMAICY, FILIP NO.:  [id number]   MEDICAL RECORD NO.:  [id number]          PATIENT TYPE:  OBV   LOCATION:  1513                         FACILITY:  [facility]   PHYSICIAN:  Valetta Mole. Swords, MD    DATE OF BIRTH:  April 26, 1921   DATE OF ADMISSION:  08/07/2008  DATE OF DISCHARGE:  08/09/2008                               DISCHARGE SUMMARY   DISCHARGE DIAGNOSES:  1. Major depression.  2. History of Parkinson's disease.  3. Hypothyroidism.  4. History of hypokalemia.  5. Hypertension.  6. Hyperlipidemia.  7. History of diverticulosis.   MEDICATIONS:  See discharge medication list (home med rec list).   CONDITION ON DISCHARGE:  Improved, no suicidal tendencies. No homicidal  tendencies.  She is ambulating in the halls and eating adequately.   FOLLOW-UP PLANS:  Dr. Cato Mulligan in 2 weeks.   HOSPITAL PROCEDURES:  None.   HOSPITAL LABORATORIES:  Urine culture negative.  BMET normal except for  potassium 3.4, chloride 114, prealbumin low at 11.5.  TSH normal at  0.618.  Cardiac enzymes negative.  CBC normal except for hemoglobin of  11.3.   HOSPITAL COURSE:  The patient was admitted to the hospital service on  Aug 07, 2008, see admission note for details.  The patient was admitted  with severe depression.  It turns out that she had discontinued her  Seroquel about 2 weeks.  Her granddaughter noticed a marked decreased  ability to function and decreased mood over the past 2 weeks.  She was  eating poorly.  She was started back on Seroquel in the hospital.  She  was given a prescription for Seroquel. She understands the need never to  stop that again with a doctor's permission or knowledge.  At the time of  discharge she was ambulating in the halls with a walker without  difficulty.  She will be discharged with home health.   History of Parkinson's disease. Currently no medications and doing  reasonably well.   Other medical problems as listed on the HPI and as  above.  Should  continue her home medications.  She will follow up with me in 2 weeks.      Bruce Rexene Edison Swords, MD  Electronically Signed     B[facility]/MEDQ  D:  08/09/2008  T:  08/09/2008  Job:  [id number]

## 2010-07-25 NOTE — H&P (Signed)
NAMEJESTINA, Tammy Branch NO.:  [id number]   MEDICAL RECORD NO.:  [id number]          PATIENT TYPE:  INP   LOCATION:  0113                         FACILITY:  [facility]   PHYSICIAN:  Pedro Earls, MD     DATE OF BIRTH:  09-03-1921   DATE OF ADMISSION:  07/08/2008  DATE OF DISCHARGE:                              HISTORY & PHYSICAL   CHIEF COMPLAINT:  Failure to thrive.   HISTORY OF PRESENT ILLNESS:  This is an 77 year old white female patient  who was recently discharged from the [facility] system 2 weeks ago presented  again with a chief complaint of weakness.  The patient is not able to  provide with detailed history.  Most history was obtained after talking  to the ER physician, records, and the family.  According to the family,  since the patient had been discharged from the hospital she has not been  eating well.  The patient was also recently started on Sinemet which had  been discontinued and for past 6 days the patient had also stopped  eating.  The patient takes a few bites but has not been eating a  substantial amount of her meals.   The patient does not answer if she has loss of appetite or if she does  not want to eat, although denies any abdominal pain, any chest pain, any  shortness of breath, any nausea or vomiting.   REVIEW OF SYSTEMS:  As above.  Rest of the review of systems are  negative.   Please see recent H and P dictated on June 24, 2008 for past medical  history, past surgical history, social history, allergies, family  history, and medications.   PHYSICAL EXAM:  VITALS:  Temperature 97.6, blood pressure 129/63, pulse  76, respirations 16, pulse ox 97% room air.  CONSTITUTIONAL:  Patient awake, alert, oriented x3.  Does not appear to  be in acute distress.  HEENT:  Pupils equal, round, reactive to light.  No icterus.  Mild  pallor.  Extraocular movements intact.  Mucosa is dry.  NECK:  Supple.  No JVD noted.  CARDIOVASCULAR:  S1, S2, irregular.   No murmurs, heaves or gallops.  CHEST:  Clear.  ABDOMEN:  Soft, nontender.  Bowel sounds present.  No  hepatosplenomegaly.  EXTREMITIES:  Peripheral pulses present clubbing, cyanosis or edema.  CNS:  Sensory motor grossly intact and cranial nerves 2-12 are intact.  SKIN:  No rashes.  MUSCULOSKELETAL:  Unremarkable.   LABS:  UA unremarkable.  Creatinine is 2.19 was 1.0 last admission.  BUN  is 34 which is high as well.  Hemoglobin is 15.3, hematocrit 43.8.  EKG  showed normal sinus rhythm, left axis deviation, nonspecific T-waves  were also seen.   The patient's abdominal x-ray with chest did not reveal any abnormality  in chest or in abdomen:   IMPRESSION:  1. Acute renal failure.  2. Failure to thrive.  3. Dehydration.  4. Hypertension.  5. Dementia.   PLAN:  Admit to med/surg.  IV fluids.  Consult PT/OT and case management  for possible placement.  Repeat basic metabolic  panel in morning.      Pedro Earls, MD  Electronically Signed     NS/MEDQ  D:  07/08/2008  T:  07/08/2008  Job:  [id number]

## 2010-07-25 NOTE — Discharge Summary (Signed)
NAMEZENNA, Tammy Branch NO.:  [id number]   MEDICAL RECORD NO.:  [id number]          PATIENT TYPE:  INP   LOCATION:  3028                         FACILITY:  [facility]   PHYSICIAN:  Titus Dubin. Hopper, MD,FACP,FCCPDATE OF BIRTH:  [birth year]1923   DATE OF ADMISSION:  06/23/2008  DATE OF DISCHARGE:  06/26/2008                               DISCHARGE SUMMARY   ADMITTING DIAGNOSES:  1. Altered mental status.  2. History of hypertension.  3. Clinical dehydration.  4. Hyperthyroidism.   DISCHARGE DIAGNOSES:  1. Mental status, improved.  2. Possible urinary tract infection, culture pending.   For details of history and physical, please see the dictated note of  June 23, 2008, by Dr. Adela Glimpse.  The patient has a past history of  Parkinson's and hypertension.  The patient  had dificulty arising from  bed for 5 days prior to admission .Also she exhibited decreased  affect  and was not answering direct questions.  Poor appetite was noted  and  possible paranoid-type thoughts.  They contacted her neurologist, who  recommended reducing the Sinemet dose; the family actually stopped  it  completely.  There was no improvement and she began to have increased  tremor with generalized weakness and malaise.  She remained in bed and  was refusing food.   In the emergency room, her potassium was 2.9.  Urinalysis showed  leukocytosis, but no nitrates and many bacteria.  CAT scan of the head  was negative.  Chest x-ray was unremarkable.   MRI and MRA of the head revealed no acute intracranial abnormality.  She  had moderate atrophy with extensive white matter disease, which was  stable.  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  Intracranial atherosclerotic changes were noted.  Her initial  glucose was 173, but A1c was 4.9.  Serial cardiac enzymes were negative.  Folate, TSH, and B12 were all normal.  Lipids revealed an HDL of 47 and  LDL of 110.  The potassium  was repleted and rose to 4.7.   Initial urine culture showed 50,000 colonies of multiple species.  At  the time of discharge, repeat culture was pending.  She had been placed  on Cipro empirically.   On the day of discharge, the patient was sitting upright, having eaten  breakfast.  She stated that she had had a good night sleep; she had  received Ambien.  She denied any symptoms.  Specifically, she denied  anxiety or depression.  This had been raised as a possibility.   She was afebrile.  Pulse was 69, respiratory rate 20, blood pressure  100/56, and O2 sats were 97% on room air.  She knew  the date, her  location, primary care doctor's name, and her home  phone number.  She  was slightly tremulous, but interactive with no clinical depression or  anxiety.  She exhibited gallop cadence.  She had minimal rales with no  increased work of breathing.  There is no peripheral edema.  Mild  parkinsonian stigmata were noted with some loss of facial definition.   She was clinically  improved and she was discharged on additional 5 days  of Cipro.  The etiology of her hypokalemia is unclear as there are no  diuretics listed on admission med list.   An antidepressant was not initiated; this will be deferred to Dr.  Cato Mulligan.   DISCHARGE MEDICATIONS:  1. Lisinopril 40 mg daily.  2. Felodipine ER 5 mg daily.  3. Synthroid 50 mcg (TSH was therapeutic).  4. Estradiol 2 mg.  Whether the hormonal replacement therapy is      continued long term, it will be deferred to Dr. Cato Mulligan as well.  5. New medication would be Cipro 500 mg twice a day for 5 days.   She will be asked to see Dr. Cato Mulligan in approximately 1 week.  Potassium  could be repeated at that time.      Titus Dubin. Alwyn Ren, MD,FACP,FCCP  Electronically Signed     [facility]/MEDQ  D:  06/26/2008  T:  06/26/2008  Job:  [id number]   cc:   Valetta Mole. Swords, MD  [facility]

## 2010-07-25 NOTE — Consult Note (Signed)
Tammy Branch, Tammy Branch NO.:  [id number]   MEDICAL RECORD NO.:  [id number]          PATIENT TYPE:  INP   LOCATION:  1317                         FACILITY:  [facility]   PHYSICIAN:  Antonietta Breach, M.D.  DATE OF BIRTH:  June 11, 1921   DATE OF CONSULTATION:  07/09/2008  DATE OF DISCHARGE:                                 CONSULTATION   REASON FOR CONSULTATION:  Depression.   REQUESTING PHYSICIAN:  [facility].   HISTORY OF PRESENT ILLNESS:  Ms. Tammy Branch is an 77 year old  female admitted to the [facility] on July 08, 2008, due to  acute renal failure and failure to thrive.  The patient has been  experiencing over 8 weeks of depressed mood, low energy, difficulty  concentrating and anhedonia as well as thoughts of hopelessness and  helplessness.  However, at home she also has had some intermittent  agitation with severe paranoia and has been combative towards family  members.  She was started on Seroquel 50 mg q.h.s. and the paranoia is  currently decreased.  She is no longer combative.  However, she  continues with all of the above depressive symptoms as well as thoughts  of hopelessness and helplessness.  She remains in bed in a vegetative  state however she does verbalize.  She tells the undersigned that she  has been wishing to die.  However she consistently denies that she has  had any thoughts of actively taking her life.  Her memory function is  overall intact although she does have some slight orientation deficit.  She has not been started on any other psychotropic medication at home.   The family reports that the primary care physician had been focusing on  treating her Parkinson's disease.   She has had very poor appetite and this has resulted in dehydration and  a cachectic state.   PAST PSYCHIATRIC HISTORY:  In receiving the history from the patient she  allowed her husband and grandson to be in the room.  They confirmed that  approximately 2 years ago she underwent a similar episode without these  severe physical symptoms.  She did not receive care for that episode and  it did spontaneously remit.  They mention that during the episode 2  years ago that the concentration symptom was worse and that she would be  frequently confused.   FAMILY PSYCHIATRIC HISTORY:  One of the patient's daughters passed away  from complications of systemic lupus erythematosus.  However, it is  believed that she did have depression and was treated with some form of  psychotropic medication.  They do not know the name.   SOCIAL HISTORY:  Ms. Tammy Branch lives with her husband.  They have been  married for many years.  Ms. Tammy Branch does not use any illegal drugs or  alcohol.   PAST MEDICAL HISTORY:  She does have Parkinson's disease as well as  hypertension.  She also has hypothyroidism.   ALLERGIES:  No known drug allergies.   MEDICATIONS:  The MAR is reviewed.  She is on Synthroid 50 mcg daily as  well as Seroquel  50 mg q.h.s.   LABORATORY DATA:  Sodium 143, BUN 29, creatinine 1.99, glucose 81.  SGOT  17, SGPT 13.  An MR of the brain was unremarkable.  WBC 5.3, hemoglobin  12.8, platelet count 150.  T4, TSH, B12 and folic acid all unremarkable.   REVIEW OF SYSTEMS:  The patient can provide only part of this.  The rest  is gleaned from the family, the medical record, staff and past medical  record.  Constitutional, head, eyes, ears, nose, throat, mouth,  neurologic, psychiatric, cardiovascular, respiratory, gastrointestinal,  genitourinary, skin, musculoskeletal, hematologic, lymphatic, endocrine  and metabolic all unremarkable.   PHYSICAL EXAMINATION:  VITAL SIGNS:  Temperature 98, pulse 65,  respiratory rate 20, blood pressure 128/56, O2 saturation on room air  96%.  GENERAL APPEARANCE:  Tammy Branch is an elderly female lying in a supine  position in her hospital bed with no abnormal involuntary movement.  MENTAL STATUS  EXAM:  Tammy Branch has poor eye contact.  Her attention  span is mildly decreased.  Concentration is decreased.  Her affect is  very constricted.  Mood is profoundly depressed.  On orientation testing  she is oriented to person.  She does know the day of the month, however,  she thinks that the month is May.  She is intact for the year as well as  the hospital.  On memory testing she is grossly intact.  Her fund of  knowledge and intelligence are below that of her estimated premorbid  baseline.  Her speech is very soft with a slightly flat prosody.  There  is no dysarthria.  Her speech has a slow process.  Thought process is  coherent, however, the rate is slow.  Thought content - profound  hopelessness and helplessness as well as thoughts of wishing she was  dead.  There is no current evidence of delusions.  However, she is very  resistant to talk.  Her insight is partial for depression.  Judgment is  impaired.   ASSESSMENT:  AXIS I:  1. 293.83 mood disorder not otherwise specified.  2. 293.81 psychotic disorder not otherwise specified.  3. She does have some contributing general medical factors.  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  AXIS II:  Deferred.  AXIS III:  See past medical history.  AXIS IV:  General medical.  AXIS V:  30.   The undersigned discussed the care with the patient and her husband.  They understand and want to proceed.   RECOMMENDATIONS:  1. Would start Remeron at 7.5 mg p.o. q. supper time.  Would then      increase as tolerated by 7.5 mg per evening to the target dose of      the initial trial at 22.5 mg q. supper time.  Remeron will have      some potential advantageous side effects such as antinausea,      stimulation of appetite and helping with nocturnal insomnia.  As      time goes on the alpha II blockade of Remeron should help with      energy and overall antidepression.  However, if  responds is      inadequate would start Celexa at 10 mg p.o. q.a.m. increasing as      tolerated to 20 mg p.o. q.a.m. after 3 days would utilize caution      with the Celexa regarding nausea or  loose stools.  However, the      Remeron can counteract nausea through his anti 5-HT3 effects.  2. Would continue low stimulation ego support.  3. Would observe for any stiffness or other extrapyramidal side      effects of the Seroquel.  If delusions become evident would      increase the Seroquel to 100 mg q.h.s.  Would also check an EKG to      ensure that the QTC is less than 500 milliseconds.  4. She does appear to be an excellent candidate for an inpatient      geropsychiatric unit once cleared from a general medical      perspective.  5. If she remains cooperative with care and noncombative and if she      can take her medications consistently she could be treated just as      well in a skilled nursing facility if available.  However, if      moving to a skilled nursing facility for ongoing treatment of her      depression she would require regular followup with a psychiatrist      for management.  6. She does agree to contact the nursing station immediately for any      thoughts of harming herself.      Antonietta Breach, M.D.  Electronically Signed     JW/MEDQ  D:  07/10/2008  T:  07/10/2008  Job:  [id number]

## 2010-07-25 NOTE — Discharge Summary (Signed)
Tammy Branch, HARMON NO.:  [id number]   MEDICAL RECORD NO.:  [id number]          PATIENT TYPE:  INP   LOCATION:  1317                         FACILITY:  [facility]   PHYSICIAN:  Pedro Earls, MD     DATE OF BIRTH:  [birth year]   DATE OF ADMISSION:  07/08/2008  DATE OF DISCHARGE:  07/10/2008                               DISCHARGE SUMMARY   DISCHARGE DIAGNOSES:  1. Weakness.  2. Anorexia.  3. Failure to thrive.  4. Acute renal failure.  5. Dehydration.  6. Major depression.   ASSOCIATED DIAGNOSES:  1. Hypertension.  2. Lewy body dementia.  3. Parkinson's disease.  4. Hypothyroidism.   CONSULTATION:  Psychiatrist.   HOSPITAL COURSE:  This is an 77 year old white female patient who was  admitted with a chief complaint of failure to thrive and not eating for  the past 6 days at home.  The patient was found to be dehydrated with  elevated creatinine from previous time.  The patient was admitted to  med/surg.  IV fluids were started.  The patient's creatinine had  improved.  The patient started feeling a little bit better.  Physical  therapy was consulted as well as speech and swallowing nutrition.  Recommendation from physical therapy was to continue home health  physical therapy upon discharge.  Psychiatrist was also consulted for  possible depression.  In addition to medication, Remeron was added 7.5  mg daily, and recommendation was to add Celexa if Remeron is not  sufficient.   The patient was discharged in stable condition to follow up with primary  care physician, Dr. Cato Mulligan.   DISCHARGE MEDICATIONS:  Please see the detailed reconciled list of  medications given to the patient at time of discharge.  1. Estradiol 2 mg daily.  2. Felodipine 5 mg daily.  3. Levothyroxine 50 mcg daily.  4. Lisinopril 40 mg daily.  5. Seroquel 25 mg at bedtime.  6. Remeron 7.5 mg daily.   Follow up with home health PT.  Follow up with Dr. Cato Mulligan in 1-2 weeks.      Pedro Earls, MD  Electronically Signed     NS/MEDQ  D:  07/10/2008  T:  07/10/2008  Job:  [id number]   cc:   Valetta Mole. Swords, MD  [address] [place]  [place] [address]

## 2010-07-25 NOTE — Consult Note (Signed)
NAMECATELIN, Tammy Branch NO.:  [id number]   MEDICAL RECORD NO.:  [id number]          PATIENT TYPE:  INP   LOCATION:  3028                         FACILITY:  [facility]   PHYSICIAN:  Deanna Artis. Hickling, M.D.DATE OF BIRTH:  04-08-1921   DATE OF CONSULTATION:  DATE OF DISCHARGE:                                 CONSULTATION   CHIEF COMPLAINT:  Failure to thrive.   HISTORY OF PRESENT CONDITION:  The patient is an 77 year old woman who  was seen by my partner, Dr. Terrace Arabia in March 2010.  The patient had an  episode of abrupt confusional state, increased gait difficulty which  occurred on April 24, 2008.  CT scan of the brain did not show  evidence of stroke.  MRI scan of the brain 13 days later showed right  temporal parietal cavernous angioma, small chronic hemorrhagic lesion in  the right inferior cerebellum but did not show evidence of acute stroke.   The patient was evaluated by Dr. Terrace Arabia.  It was her impression that the  patient had parkinsonism, problems with memory and the cavernous angioma  in the right parietal temporal region noted above.  She recommended  workup for treatable causes of memory loss.  She also recommended low-  dose Sinemet 20/100 gradually increasing the medicine to 2 tablets three  times a day.   The patient did not tolerate Sinemet in almost any dose.  She had nausea  and vomiting and decreased appetite.  She has lost about 10 pounds in  the last few weeks.  Family finally quit Sinemet and brought her into  the hospital today because of concerns over her weight loss and  dehydration and worries that there might be some other underlying  problem that was not being addressed.   I was asked to see her because she appears to family as had further  mental status change.  She is showing sundowning behavior.  She is  having ideas of reference in the TV sat that happens most often at  nighttime.  She is not wondering.  She is not hearing voices.  She  has  not shown an agitated delirium.  Rather she seems to be somewhat more  withdrawn, less communicative and less willing to do things such as a  cook meals for the family which is something that she used to enjoy  greatly.   Beginning on Friday April 8, she had increasing sleepiness, episodes of  staring, paranoid personality.  As a result of this, the family stopped  Sinemet on their own.   PAST MEDICAL HISTORY:  Remarkable for hypertension, parkinsonism, and  hypothyroidism.   MEDICATIONS:  Now estradiol 2 mg daily, felodipine 5 mg daily, Synthroid  50 mcg daily, Protonix 40 mg twice daily.  She was on Sinemet, but no  longer is.   DRUG ALLERGIES:  None known.   SOCIAL HISTORY:  She does not smoke or drink alcohol or take drugs.   REVIEW OF SYSTEMS:  Remarkable for hallucinations, depression, paranoid  ideation, weight change, and generalized weakness.  Twelve-system review  is otherwise negative.   Examination today  of mini-mental status was 19, clock drawing was 3/4.  I did not ask her to name animals.   PHYSICAL EXAMINATION:  VITAL SIGNS:  Blood pressure 114/55, resting  pulse 75, respirations 20, temperature 97.6.  GENERAL:  No signs infection.  No bruits.  No meningismus.  LUNGS:  Clear to auscultation.  HEART:  No murmurs.  Pulses normal.  ABDOMEN:  Soft.  Bowel sounds normal.  EXTREMITIES:  Well-formed without edema.  NEUROLOGIC:  Mental status, awake, alert, names objects, follows  commands.  I did not repeat the mini-mental status examination but she  was oriented except to the day of the week and the date of the month.  She was only able to remember 1 of 3 objects at 3 minutes and was not  able to spell the word world forwards, let alone backwards.  She is able  to track seven from 100 correctly but was not able to carry out any  other serial subtraction without difficulty.  She is able to follow some  three-step commands, was able to repeat phrases and name  objects.  I did  not ask her to draw.  Calculations be on this subtracting seven from 100  were in inaccurate.  CRANIAL NERVES:  Round reactive pupils.  Visual fields full to double  simultaneous stimuli.  Symmetric facial strength.  Midline tongue.  Air  conduction greater than bone conduction bilaterally.  MOTOR: The patient has fairly normal strength in her arms and her legs.  She had ability to oppose her thumb and her finger and tap her finger  quite well.  She has a mild extension tremor but does not have an  intention tremor.  In other words, I think she is showing signs of  essential tremor rather than a parkinsonian tremor.  She does not have  cogwheel rigidity.  I did not get her up to watch her stoop.  She does  not have an excessive face that she has good facial strength, midline  tongue and uvula, air conduction greater than bone conduction.  Motor  examination, as mentioned she has good strength.  There is no drift.  She can oppose with thumb and her fingers.  Deep tendon reflexes are  symmetric and diminished.  The patient had bilateral flexor plantar  responses.   LABORATORY STUDIES:  PT 15.6, INR 1.2, PTT 27.  Urinalysis shows  increased urobilinogen but is otherwise unremarkable.  Sodium 138,  potassium 4.7, chloride 104, CO2 26, BUN 15, creatinine 0.82, glucose  113, white blood cell count 6600, hemoglobin 15.1, hematocrit 43.6,  platelet count 144,000.  PT 15.6, INR 1.2, PTT 27.  MRI scan again shows  a stable appearance of the cavernous angioma.  MRA did not show any  significant stenosis.   IMPRESSION:  The patient may have mild parkinsonism.  I do not think  Sinemet was tolerable.  I am concerned about depression and we should  address that.  I am further concerned that she may not have a will to  live, although we need to discern that as well.  Finally, we need to  make certain that she does not have some neuromuscular etiology issue as  far as her swallowing.   I have talked about feeding tubes and had been  somewhat concerned about whether or not that is an appropriate treatment  for person of her age with her problems.  I think that it is best to  have Dr. Terrace Arabia involved with any decisions  that are made with as regards  to her nervous system.  I am not going to give her sleeping pill  tonight.  I would strongly urge minimal changes in medications to deal  with issues like depression and sundowning which could have very big  effects on her alertness and vitality. (293.0)      Deanna Artis. Sharene Skeans, M.D.  Electronically Signed     [facility]/MEDQ  D:  06/24/2008  T:  06/25/2008  Job:  [id number]   cc:   Valetta Mole. Swords, MD

## 2010-07-25 NOTE — H&P (Signed)
Tammy Branch, HARTSOCK NO.:  [id number]   MEDICAL RECORD NO.:  [id number]          PATIENT TYPE:  INP   LOCATION:  3028                         FACILITY:  [facility]   PHYSICIAN:  Michiel Cowboy, MDDATE OF BIRTH:  [birth year]   DATE OF ADMISSION:  06/23/2008  DATE OF DISCHARGE:                              HISTORY & PHYSICAL   PRIMARY CARE [place]:  Valetta Mole. Swords, M.D.   CHIEF COMPLAINT:  Altered mental status.   HISTORY OF THE PRESENT ILLNESS:  The patient is an 77 year old female  with a past medical history significant for Parkinson's disease and  hypertension.  The patient lives at home and is usually at her baseline  being able to take care of her basic needs, i.e. cooking and is fairly  talkative.  For the past 5 days her family noticed that she has been  having trouble getting out of bed with decreased affect, was not really  answering questions, did not really want to eat, and had been having  paranoid thoughts that somebody was going to come and hurt her.  They  called her neurologist who told them to decrease her Sinemet; however,  they actually stopped it altogether.  Her symptoms did not improve.  However, she started to develop some tremors, continued to be generally  very weak, not wanting to get out of bed and refusing food at which  point they brought her into the emergency department where all her blood  work, except for a potassium of 2.9, has been so far unremarkable.  Her  UA showed no leukocytosis and no nitrites, but many bacteria.  CT scan  of the head was negative and chest x-ray was negative at which point  [facility] was called for admission.   PAST MEDICAL HISTORY:  The past medical history significant for:  1. Parkinson disease; and,  2. Hypertension.  3. Hyperthyroidism.   REVIEW OF SYSTEMS:  The patient's family adds that she did have some  nausea, no vomiting and has had some diarrhea for the past few days.   SOCIAL  HISTORY:  The patient does not smoke, does not drink and does not  abuse drugs.  She lives at home and has a very supportive family.   FAMILY HISTORY:  The family history is noncontributory.   ALLERGIES:  No known drug allergies.   MEDICATIONS:  1. Estradiol 2 mg by mouth daily.  2. Felodipine 5 mg by mouth daily.  3. Levothyroxine 50 mcg by mouth daily.  4. Lisinopril 40 mg daily.  5. Sinemet; the patient used to take 25 mg/100 mg 2 tablets three      times a day, but currently is not taking anything.   PHYSICAL EXAMINATION:  VITAL SIGNS:  Temperature 96.9, blood pressure  149/79, pulse 129 initially, but is now down to 65, respirations 16, and  satting at 97% on room air.  GENERAL APPEARANCE:  The patient appears to be currently in no acute  distress.  HEENT:  Head is atraumatic.  Dry mucous membranes.  Decreased skin  turgor.  LUNGS:  The lungs are clear  to auscultation bilaterally, but with poor  effort.  HEART:  The heart has a regular rate and rhythm.  No murmurs are  appreciated.  ABDOMEN:  The abdomen is soft, slightly obese and nontender.  EXTREMITIES:  The lower extremities are without clubbing, cyanosis or  edema.  Strength appears to be 5/5 in all four extremities.  NEUROLOGIC EXAMINATION:  Slight cogwheeling is noted.  Cranial nerves II-  XII are intact.  Otherwise she appears to be neurologically intact.  SKIN:  The skin is clean, dry and intact.   LABORATORY DATA:  White blood cell count 7.4 and hemoglobin 15.5.  Sodium 136, potassium 2.9 and creatinine 1.02.  UA showed many bacteria.  CT  scan of the head was negative.  Chest x-ray was unremarkable.  EKG  was not obtained.   ASSESSMENT AND PLAN:  This is an 77 year old female with altered mental  status for the past 5 days.  Her hospital workup has not been completely  conclusive.   1. Altered mental status.  I am not sure if this has anything to do      with her Sinemet as stopping it altogether did not  help at all.  We      will restart it at her full-dose.  If the patient starts to have      some tremors we will start it at 1 tablet twice a day.  We will      need a neurology consult.  I wonder if a psychiatric consult could      also be helpful.  We will order a magnetic resonance imaging and a      magnetic resonance angiography to rule out any potential stroke,      although this is very much less likely.  We will check a B12,      folate and a thyroid stimulating hormone.  We will check a 12-lead      electrocardiogram for completion sake and one set of cardiac      markers.  2. History of hypertension.  Continue lisinopril and felodipine, and      follow parameters.  3. Clinical dehydration.  Although she has no significant renal      failure we will give her intravenous fluids and follow      orthostatics.  Hopefully the patient will improve.  4. Follow potassium.  We will replace.  5. Hyperthyroidism.  Continue Synthroid.  6. Prophylaxes.  Protonix plus Lovenox.  7. Code Status.  The patient wishes to be Do Not Resuscitate/Do      Not Intubate.   Dr. Rene Paci will assume the patient's care in the A.M.      Michiel Cowboy, MD  Electronically Signed     AVD/MEDQ  D:  06/24/2008  T:  06/24/2008  Job:  [id number]   cc:   Valetta Mole. Swords, MD

## 2010-08-28 ENCOUNTER — Other Ambulatory Visit: Payer: Self-pay | Admitting: *Deleted

## 2010-09-06 ENCOUNTER — Ambulatory Visit: Payer: 59 | Admitting: Internal Medicine

## 2010-09-07 ENCOUNTER — Ambulatory Visit: Payer: 59 | Admitting: Internal Medicine

## 2010-10-04 ENCOUNTER — Ambulatory Visit: Payer: 59 | Admitting: Internal Medicine

## 2010-11-07 ENCOUNTER — Ambulatory Visit (INDEPENDENT_AMBULATORY_CARE_PROVIDER_SITE_OTHER): Payer: Medicare Other | Admitting: Internal Medicine

## 2010-11-07 ENCOUNTER — Encounter: Payer: Self-pay | Admitting: Internal Medicine

## 2010-11-07 DIAGNOSIS — E039 Hypothyroidism, unspecified: Secondary | ICD-10-CM

## 2010-11-07 DIAGNOSIS — F333 Major depressive disorder, recurrent, severe with psychotic symptoms: Secondary | ICD-10-CM

## 2010-11-07 DIAGNOSIS — E785 Hyperlipidemia, unspecified: Secondary | ICD-10-CM

## 2010-11-07 DIAGNOSIS — I10 Essential (primary) hypertension: Secondary | ICD-10-CM

## 2010-11-07 LAB — BASIC METABOLIC PANEL
BUN: 19 mg/dL (ref 6–23)
CO2: 29 mEq/L (ref 19–32)
Chloride: 103 mEq/L (ref 96–112)
Creatinine, Ser: 0.9 mg/dL (ref 0.4–1.2)

## 2010-11-07 NOTE — Progress Notes (Signed)
  Subjective:    Patient ID: Tammy Branch, female    DOB: [birth year], 77 y.o.   MRN: [id number]  HPI  mulitple med problems She is doing fairly well. Family members agree  Hx of depression---no meds and doing well.   Past Medical History  Diagnosis Date  . Diverticulosis of colon   . Hyperlipidemia   . Hypertension   . Hypothyroidism   . Parkinson disease    Past Surgical History  Procedure Date  . Abdominal hysterectomy     reports that she has never smoked. She does not have any smokeless tobacco history on file. Her alcohol and drug histories not on file. family history includes Cancer in her father and Stroke in her mother. Allergies  Allergen Reactions  . Citalopram Hydrobromide     REACTION: hallucinations     Review of Systems  patient denies chest pain, shortness of breath, orthopnea. Denies lower extremity edema, abdominal pain, change in appetite, change in bowel movements. Patient denies rashes, musculoskeletal complaints. No other specific complaints in a complete review of systems.      Objective:   Physical Exam  Well-developed well-nourished female in no acute distress. HEENT exam atraumatic, normocephalic, extraocular muscles are intact. Neck is supple. No jugular venous distention no thyromegaly. Chest clear to auscultation without increased work of breathing. Cardiac exam S1 and S2 are regular. Abdominal exam active bowel sounds, soft, nontender. Extremities with 1+ edema at ankles. Neurologic exam : broad based gait.     Assessment & Plan:

## 2010-11-13 NOTE — Assessment & Plan Note (Signed)
BP Readings from Last 3 Encounters:  11/07/10 162/98  06/06/10 140/88  12/06/09 178/76   I repeated blood pressure 144/70. I don't  think any further evaluation needs to happen at this time.

## 2010-11-13 NOTE — Assessment & Plan Note (Signed)
Continue replacement therapy 

## 2010-11-13 NOTE — Assessment & Plan Note (Signed)
Lab Results  Component Value Date   CHOL  Value: 171        ATP III CLASSIFICATION:  <200     mg/dL   Desirable  161-096  mg/dL   Borderline High  >=045    mg/dL   High        06/18/8117   CHOL 258* 02/24/2007   Lab Results  Component Value Date   HDL 47 06/24/2008   HDL 64.6 02/24/2007   Lab Results  Component Value Date   LDLCALC  Value: 110        Total Cholesterol/HDL:CHD Risk Coronary Heart Disease Risk Table                     Men   Women  1/2 Average Risk   3.4   3.3  Average Risk       5.0   4.4  2 X Average Risk   9.6   7.1  3 X Average Risk  23.4   11.0        Use the calculated Patient Ratio above and the CHD Risk Table to determine the patient's CHD Risk.        ATP III CLASSIFICATION (LDL):  <100     mg/dL   Optimal  147-829  mg/dL   Near or Above                    Optimal  130-159  mg/dL   Borderline  562-130  mg/dL   High  >865     mg/dL   Very High* 7/84/6962   Lab Results  Component Value Date   TRIG 72 06/24/2008   TRIG 106 02/24/2007   Lab Results  Component Value Date   CHOLHDL 3.6 06/24/2008   CHOLHDL 4.0 CALC 02/24/2007   Lab Results  Component Value Date   LDLDIRECT 175.8 02/24/2007   She would not be a candidate for treatment given her multiple other medical problems.

## 2011-03-20 ENCOUNTER — Telehealth: Payer: Self-pay | Admitting: *Deleted

## 2011-03-20 ENCOUNTER — Ambulatory Visit (INDEPENDENT_AMBULATORY_CARE_PROVIDER_SITE_OTHER): Payer: Medicare Other | Admitting: Internal Medicine

## 2011-03-20 ENCOUNTER — Encounter: Payer: Self-pay | Admitting: Internal Medicine

## 2011-03-20 VITALS — BP 160/90 | Temp 98.4°F | Wt 132.0 lb

## 2011-03-20 DIAGNOSIS — I10 Essential (primary) hypertension: Secondary | ICD-10-CM | POA: Diagnosis not present

## 2011-03-20 DIAGNOSIS — R443 Hallucinations, unspecified: Secondary | ICD-10-CM | POA: Diagnosis not present

## 2011-03-20 DIAGNOSIS — E039 Hypothyroidism, unspecified: Secondary | ICD-10-CM | POA: Diagnosis not present

## 2011-03-20 DIAGNOSIS — F0390 Unspecified dementia without behavioral disturbance: Secondary | ICD-10-CM

## 2011-03-20 MED ORDER — DONEPEZIL HCL 5 MG PO TABS: 5.0000 mg | ORAL_TABLET | Freq: Every evening | ORAL | Status: DC | PRN

## 2011-03-20 NOTE — Progress Notes (Signed)
  Subjective:    Patient ID: Tammy Branch, female    DOB: 1921/04/16, 78 y.o.   MRN: [id number]  HPI  78 year old patient who has a history of parkinsonism and treated hypertension. She is brought in by her husband and son due to the onset of visual and auditory hallucinations over the past day. Apparently she had similar episodes about one year ago while hospitalized. There has been some decline in cognition. Complaints from the patient and include constipation and mobility issues   Review of Systems  Musculoskeletal: Positive for gait problem.  Psychiatric/Behavioral: Positive for hallucinations, behavioral problems and decreased concentration.       Objective:   Physical Exam  Constitutional: She appears well-developed and well-nourished. No distress.  Neurological: She is alert. No cranial nerve deficit.       Requires assistance with ambulation Alert but with moderate of cognitive impairment. Unable to name the year month or day of the week. Was able to name the season of the year Exam nonfocal          Assessment & Plan:   Cognitive impairment with history of parkinsonian syndrome and hallucinations suggestive of Lewy body dementia. The patient did have a laboratory screen 4 months ago that was unremarkable. Will place on Aricept 5 mg daily. She has an appointment in one month and we'll reassess at that time. We'll consider further dementia workup and possible neurology referral depending on her clinical response Hypertension stable Hypothyroidism

## 2011-03-20 NOTE — Patient Instructions (Signed)
Follow up with Dr. Cato Mulligan one month

## 2011-03-20 NOTE — Telephone Encounter (Signed)
Daughter called re: mom having hallucinations.  Appt with Dr Kirtland Bouchard today.

## 2011-03-28 ENCOUNTER — Telehealth: Payer: Self-pay | Admitting: *Deleted

## 2011-03-28 NOTE — Telephone Encounter (Signed)
Pt usually comes in with husband and son. Please talk with one of them and/or power of attorney. I just want to make sure that we are talking with one "decision maker"

## 2011-03-28 NOTE — Telephone Encounter (Signed)
Pt's grandaughter states pt is on Seroquel 25 mg and has been for over one year, and is still waking up x 12 episodes at night.  The family would like the dose increased or med changed.

## 2011-03-29 NOTE — Telephone Encounter (Signed)
Launa Flight (grandaughter) is the POA.

## 2011-03-29 NOTE — Telephone Encounter (Signed)
Notified grandaughter.

## 2011-03-29 NOTE — Telephone Encounter (Signed)
No answer, and no voice mail. 

## 2011-03-29 NOTE — Telephone Encounter (Signed)
Increase seroquel to 50 mg po qhs

## 2011-04-17 ENCOUNTER — Other Ambulatory Visit: Payer: Self-pay | Admitting: Internal Medicine

## 2011-05-08 ENCOUNTER — Ambulatory Visit (INDEPENDENT_AMBULATORY_CARE_PROVIDER_SITE_OTHER): Payer: Medicare Other | Admitting: Internal Medicine

## 2011-05-08 ENCOUNTER — Encounter: Payer: Self-pay | Admitting: Internal Medicine

## 2011-05-08 DIAGNOSIS — G20A1 Parkinson's disease without dyskinesia, without mention of fluctuations: Secondary | ICD-10-CM

## 2011-05-08 DIAGNOSIS — I10 Essential (primary) hypertension: Secondary | ICD-10-CM | POA: Diagnosis not present

## 2011-05-08 DIAGNOSIS — E039 Hypothyroidism, unspecified: Secondary | ICD-10-CM | POA: Diagnosis not present

## 2011-05-08 DIAGNOSIS — G20 Parkinson's disease: Secondary | ICD-10-CM | POA: Diagnosis not present

## 2011-05-08 MED ORDER — DONEPEZIL HCL 5 MG PO TABS: 5.0000 mg | ORAL_TABLET | Freq: Every day | ORAL | Status: DC

## 2011-05-10 NOTE — Progress Notes (Signed)
Patient ID: Tammy Branch, female   DOB: 1921-04-14, 78 y.o.   MRN: [id number] Patient Active Problem List  Diagnoses  .   Marland Kitchen HYPOTHYROIDISM--patient's family states that she takes her medications.   Marland Kitchen HYPERLIPIDEMIA--no treatment.   .   . PARKINSON'S DISEASE--patient and family have decided not to treat at this time.   Marland Kitchen HYPERTENSION--tolerating medications.    Patient continues to live at home with her husband. She has multiple other family members who check on her daily. Patient denies any complaints. I did review Dr. Vernon Prey note. She has not had any recurrent episodes of hallucinations. Past Medical History  Diagnosis Date  . Diverticulosis of colon   . Hyperlipidemia   . Hypertension   . Hypothyroidism   . Parkinson disease     History   Social History  . Marital Status: Married    Spouse Name: N/A    Number of Children: N/A  . Years of Education: N/A   Occupational History  . Not on file.   Social History Main Topics  . Smoking status: Never Smoker   . Smokeless tobacco: Never Used  . Alcohol Use: No  . Drug Use: No  . Sexually Active: Not on file   Other Topics Concern  . Not on file   Social History Narrative  . No narrative on file    Past Surgical History  Procedure Date  . Abdominal hysterectomy     Family History  Problem Relation Age of Onset  . Stroke Mother   . Cancer Father     prostate    Allergies  Allergen Reactions  . Citalopram Hydrobromide     REACTION: hallucinations    Current Outpatient Prescriptions on File Prior to Visit  Medication Sig Dispense Refill  . levothyroxine (SYNTHROID, LEVOTHROID) 50 MCG tablet TAKE 1 TABLET BY MOUTH EVERY DAY  30 tablet  9  . lisinopril (PRINIVIL,ZESTRIL) 40 MG tablet TAKE 1 TABLET BY MOUTH ONCE DAILY--WILL NEED OFFICE VISIT IN MARCH  90 tablet  3     patient denies chest pain, shortness of breath, orthopnea. Denies lower extremity edema, abdominal pain, change in appetite, change in  bowel movements. Patient denies rashes, musculoskeletal complaints. No other specific complaints in a complete review of systems.   BP 132/78  Pulse 76  Temp(Src) 97.5 F (36.4 C) (Oral)  Wt 135 lb (61.236 kg) Elderly female in no acute distress. Masked facies. HEENT exam atraumatic, normocephalic, extraocular muscles are intact. Neck is supple. No jugular venous distention no thyromegaly. Chest clear to auscultation without increased work of breathing. Cardiac exam S1 and S2 are regular. Abdominal exam active bowel sounds, soft, nontender. Extremities no edema.

## 2011-05-10 NOTE — Assessment & Plan Note (Signed)
BP Readings from Last 3 Encounters:  05/08/11 132/78  03/20/11 160/90  11/07/10 162/98   Blood pressure is reasonably well controlled. Continue current medications.

## 2011-05-10 NOTE — Assessment & Plan Note (Signed)
Previously well controlled. Continue current medications.  

## 2011-05-10 NOTE — Assessment & Plan Note (Signed)
She certainly has some stigmata of Parkinson's disease. She has masked facies and a shuffling gait. She does not have significant cogwheeling or tremor. At this time  patient and her family declined treatment as they have previously.

## 2011-07-02 ENCOUNTER — Ambulatory Visit (INDEPENDENT_AMBULATORY_CARE_PROVIDER_SITE_OTHER): Payer: Medicare Other | Admitting: Family

## 2011-07-02 ENCOUNTER — Encounter: Payer: Self-pay | Admitting: Family

## 2011-07-02 VITALS — Temp 98.2°F | Wt 142.0 lb

## 2011-07-02 DIAGNOSIS — K5900 Constipation, unspecified: Secondary | ICD-10-CM

## 2011-07-02 DIAGNOSIS — R350 Frequency of micturition: Secondary | ICD-10-CM | POA: Diagnosis not present

## 2011-07-02 MED ORDER — POLYETHYLENE GLYCOL 3350 17 GM/SCOOP PO POWD: 17.0000 g | Freq: Every day | ORAL | Status: DC

## 2011-07-02 NOTE — Patient Instructions (Signed)

## 2011-07-02 NOTE — Progress Notes (Signed)
Subjective:    Patient ID: Tammy Branch, female    DOB: 1922/01/22, 78 y.o.   MRN: [id number]  HPI 78 year old white female, patient of Dr. Cato Mulligan is in today with urinary frequency x 6 months. Denies any foul-smelling urine, burning with urination, abdominal pain, or back pain. Husband does report that he believes that she is constipated. Last bowel movement was yesterday and normal. However, she does on a routine basis. Patient does not report any discomfort with regards to her bowels. She has not taken any medication for her symptoms. As of note, patient has a history of Alzheimer's disease.   Review of Systems  Constitutional: Negative.   Respiratory: Negative.   Cardiovascular: Negative.   Gastrointestinal: Positive for constipation. Negative for abdominal pain, abdominal distention and anal bleeding.       Possible constipation, although patient declined  Genitourinary: Positive for frequency. Negative for dysuria and flank pain.  Musculoskeletal: Negative.   Skin: Negative.   Neurological: Negative.  Negative for light-headedness and headaches.  Psychiatric/Behavioral: Positive for confusion.       Dementia   Past Medical History  Diagnosis Date  . Diverticulosis of colon   . Hyperlipidemia   . Hypertension   . Hypothyroidism   . Parkinson disease     History   Social History  . Marital Status: Married    Spouse Name: N/A    Number of Children: N/A  . Years of Education: N/A   Occupational History  . Not on file.   Social History Main Topics  . Smoking status: Never Smoker   . Smokeless tobacco: Never Used  . Alcohol Use: No  . Drug Use: No  . Sexually Active: Not on file   Other Topics Concern  . Not on file   Social History Narrative  . No narrative on file    Past Surgical History  Procedure Date  . Abdominal hysterectomy     Family History  Problem Relation Age of Onset  . Stroke Mother   . Cancer Father     prostate    Allergies    Allergen Reactions  . Citalopram Hydrobromide     REACTION: hallucinations    Current Outpatient Prescriptions on File Prior to Visit  Medication Sig Dispense Refill  . donepezil (ARICEPT) 5 MG tablet Take 1 tablet (5 mg total) by mouth at bedtime.  90 tablet  4  . levothyroxine (SYNTHROID, LEVOTHROID) 50 MCG tablet TAKE 1 TABLET BY MOUTH EVERY DAY  30 tablet  9  . lisinopril (PRINIVIL,ZESTRIL) 40 MG tablet TAKE 1 TABLET BY MOUTH ONCE DAILY--WILL NEED OFFICE VISIT IN MARCH  90 tablet  3    Temp(Src) 98.2 F (36.8 C) (Oral)  Wt 142 lb (64.411 kg)chart    Objective:   Physical Exam  Constitutional: She is oriented to person, place, and time. She appears well-developed and well-nourished.  Neck: Normal range of motion. Neck supple.  Cardiovascular: Normal rate, regular rhythm and normal heart sounds.   Pulmonary/Chest: Effort normal and breath sounds normal.  Abdominal: Soft. Bowel sounds are normal. There is no tenderness. There is no rebound.  Musculoskeletal: Normal range of motion.  Neurological: She is alert and oriented to person, place, and time.  Skin: Skin is warm and dry.  Psychiatric: She has a normal mood and affect.    UA: unable to collect      Assessment & Plan:  Assessment: Urinary frequency, constipation  Plan: MiraLax daily. Sent home with urine collection kit.  Son will return the urine asap. Call the office if symptoms worsen or persist. Recheck as scheduled, after urine collection, and PRN.

## 2011-07-03 DIAGNOSIS — R350 Frequency of micturition: Secondary | ICD-10-CM | POA: Diagnosis not present

## 2011-07-03 LAB — POCT URINALYSIS DIPSTICK
Blood, UA: NEGATIVE
pH, UA: 5.5

## 2011-07-03 NOTE — Progress Notes (Signed)
Addended by: Rita Ohara R on: 07/03/2011 03:34 PM   Modules accepted: Orders

## 2011-07-06 ENCOUNTER — Telehealth: Payer: Self-pay

## 2011-07-06 NOTE — Telephone Encounter (Signed)
Per Padonda, Urine Cx negative UTI.  Granddaughter, Carollee Herter, aware

## 2011-08-17 ENCOUNTER — Other Ambulatory Visit: Payer: Self-pay | Admitting: Internal Medicine

## 2011-08-20 ENCOUNTER — Telehealth: Payer: Self-pay | Admitting: Family Medicine

## 2011-08-20 NOTE — Telephone Encounter (Signed)
Pulled from Triage vmail. There is some confusion on what meds Tsering is supposed to be taking. Granddaughter would like a list. Please call her.

## 2011-08-21 NOTE — Telephone Encounter (Signed)
No answer, no machine.

## 2011-08-23 NOTE — Telephone Encounter (Signed)
No answer, no machine.

## 2011-08-24 ENCOUNTER — Telehealth: Payer: Self-pay | Admitting: Internal Medicine

## 2011-08-24 NOTE — Telephone Encounter (Signed)
Caller: Shannon/Child is calling with a question about Aricept, Serroquel, Miralax.The medications are written by [employer]. States 78 yo husband is giving medications; she is concerned that errors might happen and wants to confirm what meds are ordered.  Pt has Parkinson's; has episodes of being "zoned out all day," and would not provide eye contact 08/19/11.  Would like to know if she is still on Serroquel.  Also spouse is giving Miralax qd; is it to be used that often? Finally is getting Aricept hs. RN unable to access HIPAA info to confirm caller  is approved for health information.  Caller not with pt to obtain permission and may be too confused to give permission.  Does NOT yet have HCPOA.  Info noted and sent to office for call back.  Suggested may need appt to discusss behavior and medications.  If MD prefers appt, prefers Thursday 08/30/11 late morning (ie 10-12). Please call back Note: RN lost power and internet connectivity and was unable to forward message at time of original call 08/23/11.

## 2011-08-24 NOTE — Telephone Encounter (Signed)
Granddaughter calling back about her current list of medications.  States that she was told that she is not on her HIPPA form.  Please call.

## 2011-08-27 NOTE — Telephone Encounter (Signed)
Current Outpatient Prescriptions  Medication Sig Dispense Refill  . donepezil (ARICEPT) 5 MG tablet Take 1 tablet (5 mg total) by mouth at bedtime.  90 tablet  4  . levothyroxine (SYNTHROID, LEVOTHROID) 50 MCG tablet TAKE 1 TABLET BY MOUTH EVERY DAY  30 tablet  9  . lisinopril (PRINIVIL,ZESTRIL) 40 MG tablet TAKE 1 TABLET BY MOUTH ONCE DAILY  90 tablet  3    These are the current list of meds that we have given, she may have been given other meds by other mds

## 2011-08-27 NOTE — Telephone Encounter (Signed)
No answer, no machine.

## 2011-08-29 NOTE — Telephone Encounter (Signed)
No answer, no machine.

## 2011-10-10 ENCOUNTER — Other Ambulatory Visit: Payer: Self-pay | Admitting: Family

## 2011-11-07 ENCOUNTER — Ambulatory Visit: Payer: 59 | Admitting: Internal Medicine

## 2011-12-03 ENCOUNTER — Telehealth: Payer: Self-pay | Admitting: Internal Medicine

## 2011-12-03 NOTE — Telephone Encounter (Signed)
Suggest f/u Dr Selena Batten

## 2011-12-03 NOTE — Telephone Encounter (Signed)
Pt would like to switch from Dr Cato Mulligan to Dr Kirtland Bouchard. Pt is unable to come in early mornings

## 2011-12-04 NOTE — Telephone Encounter (Signed)
LMOM for Tammy Branch to return my call

## 2011-12-04 NOTE — Telephone Encounter (Signed)
Tammy Branch, please see below. Can you call the pt and sched w/Dr. Selena Batten? Thanks.

## 2011-12-04 NOTE — Telephone Encounter (Signed)
Pt is sch with Dr Selena Batten on 12-18-2011 11.15am

## 2011-12-04 NOTE — Telephone Encounter (Signed)
Ok with dr. Selena Batten

## 2011-12-18 ENCOUNTER — Ambulatory Visit (INDEPENDENT_AMBULATORY_CARE_PROVIDER_SITE_OTHER): Payer: Medicare Other | Admitting: Family Medicine

## 2011-12-18 ENCOUNTER — Encounter: Payer: Self-pay | Admitting: Family Medicine

## 2011-12-18 VITALS — BP 170/80 | Temp 98.0°F | Wt 143.0 lb

## 2011-12-18 DIAGNOSIS — F0390 Unspecified dementia without behavioral disturbance: Secondary | ICD-10-CM

## 2011-12-18 DIAGNOSIS — Z23 Encounter for immunization: Secondary | ICD-10-CM

## 2011-12-18 DIAGNOSIS — E039 Hypothyroidism, unspecified: Secondary | ICD-10-CM | POA: Diagnosis not present

## 2011-12-18 DIAGNOSIS — I10 Essential (primary) hypertension: Secondary | ICD-10-CM | POA: Diagnosis not present

## 2011-12-18 LAB — BASIC METABOLIC PANEL
CO2: 29 mEq/L (ref 19–32)
Calcium: 9.3 mg/dL (ref 8.4–10.5)
Sodium: 140 mEq/L (ref 135–145)

## 2011-12-18 LAB — TSH: TSH: 4.69 u[IU]/mL (ref 0.35–5.50)

## 2011-12-18 NOTE — Patient Instructions (Addendum)
-  We have ordered labs or studies at this visit. It usually takes 1-2 weeks for results and processing. We will contact you with instructions IF your results are abnormal. Normal results will be released to your [facility] in 1-2 weeks. If you have not heard from [place] or can not find your results in [facility] in 2 weeks please contact our office.  -use a daily fiber supplement, try to not use mirilax every day if possible  -get compression stockings and use these  -follow up in about 4-6 months or sooner if any concerns

## 2011-12-18 NOTE — Progress Notes (Signed)
Chief Complaint  Patient presents with  . Establish Care    HPI:  Here to establish care.  Dementia: -takes aricept -memory and cognition stable -lives with husband and doing well, stable per son in law -no falls, does have loss of interest in activities - has some urinary incontinence -good appetite  Hypothyroidism: -stable -denies: skin changes, fatigue, sweating, palpitations  HTN: -on lisinopril -BP always runs high when first gets in office per son in law -no CP, SOB, HAs  Vaccines: needs flu, had pneumovax   ROS: See pertinent positives and negatives per HPI.  Past Medical History  Diagnosis Date  . Diverticulosis of colon   . Hyperlipidemia   . Hypertension   . Hypothyroidism   . Parkinson disease     Family History  Problem Relation Age of Onset  . Stroke Mother   . Cancer Father     prostate    History   Social History  . Marital Status: Married    Spouse Name: N/A    Number of Children: N/A  . Years of Education: N/A   Social History Main Topics  . Smoking status: Never Smoker   . Smokeless tobacco: Never Used  . Alcohol Use: No  . Drug Use: No  . Sexually Active: None   Other Topics Concern  . None   Social History Narrative  . None    Current outpatient prescriptions:donepezil (ARICEPT) 5 MG tablet, Take 1 tablet (5 mg total) by mouth at bedtime., Disp: 90 tablet, Rfl: 4;  levothyroxine (SYNTHROID, LEVOTHROID) 50 MCG tablet, TAKE 1 TABLET BY MOUTH EVERY DAY, Disp: 30 tablet, Rfl: 9;  lisinopril (PRINIVIL,ZESTRIL) 40 MG tablet, TAKE 1 TABLET BY MOUTH ONCE DAILY, Disp: 90 tablet, Rfl: 3 polyethylene glycol powder (GLYCOLAX/MIRALAX) powder, TAKE 17 G BY MOUTH DAILY., Disp: 255 g, Rfl: 1  EXAM:  Filed Vitals:   12/18/11 1119  BP: 170/80  Temp: 98 F (36.7 C)  Recheck of BP:  There is no height on file to calculate BMI.  GENERAL: vitals reviewed and listed above, alert, oriented, appears well hydrated and in no acute  distress  HEENT: atraumatic, conjunttiva clear, no obvious abnormalities on inspection of external nose and ears  NECK: no obvious masses on inspection  LUNGS: clear to auscultation bilaterally, no wheezes, rales or rhonchi, good air movement  CV: HRRR, tr peripheral edema  MS: moves all extremities without noticeable abnormality  PSYCH: pleasant and cooperative, no obvious depression or anxiety  ASSESSMENT AND PLAN:  Discussed the following assessment and plan:  1. HYPOTHYROIDISM  TSH  2. HYPERTENSION  Basic metabolic panel  3. PARKINSON'S DISEASE    4. Dementia  Stable, cont aricept   -reviewed PMH, Meds, current issues -flu today -labs today per orders -compression stocking script given -discussed advanced directives -Patient advised to return to notify a doctor immediately if symptoms worsen or persist or new concerns arise.  Patient Instructions  -We have ordered labs or studies at this visit. It usually takes 1-2 weeks for results and processing. We will contact you with instructions IF your results are abnormal. Normal results will be released to your [facility] in 1-2 weeks. If you have not heard from [place] or can not find your results in [facility] in 2 weeks please contact our office.  -use a daily fiber supplement, try to not use mirilax every day if possible  -get compression stockings and use these  -follow up in about 4-6 months or sooner if any concerns  Colin Benton R.

## 2011-12-19 NOTE — Progress Notes (Signed)
Quick Note:  Called and spoke with pt and pt is aware. ______ 

## 2012-02-11 ENCOUNTER — Other Ambulatory Visit: Payer: Self-pay | Admitting: Internal Medicine

## 2012-03-13 ENCOUNTER — Other Ambulatory Visit: Payer: Self-pay | Admitting: Internal Medicine

## 2012-05-13 ENCOUNTER — Encounter: Payer: 59 | Admitting: Family Medicine

## 2012-05-13 NOTE — Progress Notes (Signed)
Error   This encounter was created in error - please disregard. 

## 2012-05-15 ENCOUNTER — Telehealth: Payer: Self-pay | Admitting: Family Medicine

## 2012-05-15 NOTE — Telephone Encounter (Signed)
Ok to use 1 acute slot at 11 am.

## 2012-05-15 NOTE — Telephone Encounter (Addendum)
Pt had to cancel due to snow on 3/14. Pt needs 11 am appt due to son-in-law has to bring pt, and be at work in the afternoon. Only "same day" appts.  Pls advise.

## 2012-05-27 ENCOUNTER — Ambulatory Visit: Payer: 59 | Admitting: Family Medicine

## 2012-06-07 ENCOUNTER — Other Ambulatory Visit: Payer: Self-pay | Admitting: Internal Medicine

## 2012-06-12 ENCOUNTER — Ambulatory Visit: Payer: 59 | Admitting: Family Medicine

## 2012-06-12 DIAGNOSIS — Z01419 Encounter for gynecological examination (general) (routine) without abnormal findings: Secondary | ICD-10-CM | POA: Diagnosis not present

## 2012-06-18 ENCOUNTER — Encounter: Payer: Self-pay | Admitting: Family Medicine

## 2012-06-18 ENCOUNTER — Ambulatory Visit (INDEPENDENT_AMBULATORY_CARE_PROVIDER_SITE_OTHER): Payer: Medicare Other | Admitting: Family Medicine

## 2012-06-18 VITALS — BP 142/70 | HR 69 | Temp 98.4°F | Wt 140.0 lb

## 2012-06-18 DIAGNOSIS — I10 Essential (primary) hypertension: Secondary | ICD-10-CM

## 2012-06-18 DIAGNOSIS — E785 Hyperlipidemia, unspecified: Secondary | ICD-10-CM | POA: Diagnosis not present

## 2012-06-18 DIAGNOSIS — G20 Parkinson's disease: Secondary | ICD-10-CM

## 2012-06-18 DIAGNOSIS — E039 Hypothyroidism, unspecified: Secondary | ICD-10-CM

## 2012-06-18 LAB — BASIC METABOLIC PANEL
BUN: 23 mg/dL (ref 6–23)
Calcium: 9.2 mg/dL (ref 8.4–10.5)
Creatinine, Ser: 1.1 mg/dL (ref 0.4–1.2)
GFR: 51.35 mL/min — ABNORMAL LOW (ref >60.00)
Glucose, Bld: 73 mg/dL (ref 70–99)
Sodium: 141 mEq/L (ref 135–145)

## 2012-06-18 LAB — LIPID PANEL
Cholesterol: 206 mg/dL — ABNORMAL HIGH (ref 0–200)
HDL: 47.6 mg/dL (ref >39.00)
Triglycerides: 98 mg/dL (ref 0.0–149.0)

## 2012-06-18 NOTE — Patient Instructions (Signed)
-  use fiber supplement daily, use mirilax if constipated for a few days to get things moving  -We have ordered labs or studies at this visit. It can take up to 1-2 weeks for results and processing. We will contact you with instructions IF your results are abnormal. Normal results will be released to your [facility]. If you have not heard from [place] or can not find your results in [facility] in 2 weeks please contact our office.  -follow up in 4-6 months

## 2012-06-18 NOTE — Progress Notes (Signed)
Chief Complaint  Patient presents with  . 5 month follow up    HPI:  Dementia/Parkinsons: -lives with husband,  discussed ? Lewy Body dementia and referral to neuro in the past, but deferred per family -has only been on aricept -stable Denies: falls, changes in mood or behavior  HTN/HLD: -stable -always runs high a little in office -denies: CP, SOB, increased swelling  Hypothyroid: -stable -denies: hot or cold intolerance or skin changes   ROS: See pertinent positives and negatives per HPI.  Past Medical History  Diagnosis Date  . Diverticulosis of colon   . Hyperlipidemia   . Hypertension   . Hypothyroidism   . Parkinson disease     Family History  Problem Relation Age of Onset  . Stroke Mother   . Cancer Father     prostate    History   Social History  . Marital Status: Married    Spouse Name: N/A    Number of Children: N/A  . Years of Education: N/A   Social History Main Topics  . Smoking status: Never Smoker   . Smokeless tobacco: Never Used  . Alcohol Use: No  . Drug Use: No  . Sexually Active: None   Other Topics Concern  . None   Social History Narrative  . None    Current outpatient prescriptions:donepezil (ARICEPT) 5 MG tablet, TAKE 1 TABLET (5 MG TOTAL) BY MOUTH AT BEDTIME AS NEEDED., Disp: 90 tablet, Rfl: 4;  levothyroxine (SYNTHROID, LEVOTHROID) 50 MCG tablet, TAKE 1 TABLET BY MOUTH EVERY DAY, Disp: 30 tablet, Rfl: 6;  lisinopril (PRINIVIL,ZESTRIL) 40 MG tablet, TAKE 1 TABLET BY MOUTH ONCE DAILY, Disp: 90 tablet, Rfl: 3 polyethylene glycol powder (GLYCOLAX/MIRALAX) powder, TAKE 17 G BY MOUTH DAILY., Disp: 255 g, Rfl: 1;  donepezil (ARICEPT) 5 MG tablet, Take 1 tablet (5 mg total) by mouth at bedtime., Disp: 90 tablet, Rfl: 4  EXAM:  Filed Vitals:   06/18/12 1113  BP: 142/70  Pulse: 69  Temp: 98.4 F (36.9 C)    Body mass index is 22.61 kg/(m^2).  GENERAL: vitals reviewed and listed above, alert, oriented, appears well hydrated  and in no acute distress  HEENT: atraumatic, conjunttiva clear, no obvious abnormalities on inspection of external nose and ears  NECK: no obvious masses on inspection  LUNGS: clear to auscultation bilaterally, no wheezes, rales or rhonchi, good air movement  CV: HRRR, SEM, tr peripheral edema  MS: moves all extremities without noticeable abnormality, broad based shuffling gait  PSYCH: pleasant and cooperative, no obvious depression or anxiety, flat facies  ASSESSMENT AND PLAN:  Discussed the following assessment and plan:  HYPOTHYROIDISM - Plan: TSH  HYPERLIPIDEMIA - Plan: Lipid Panel  HYPERTENSION - Plan: Basic metabolic panel  PARKINSON'S DISEASE  -LABS: TSH, BMP, Lipids, HgbA1c -has not been taking fiber supplement, occ constipation - advised daily fiber -mood and behavior stable and husband whom was present today is coping well with her dementia ans still is not interested in seeing neurology -Patient advised to return or notify a doctor immediately if symptoms worsen or persist or new concerns arise.  Patient Instructions  -use fiber supplement daily, use mirilax if constipated for a few days to get things moving  -We have ordered labs or studies at this visit. It can take up to 1-2 weeks for results and processing. We will contact you with instructions IF your results are abnormal. Normal results will be released to your [facility]. If you have not heard from [place] or  can not find your results in [facility] in 2 weeks please contact our office.  -follow up in 4-6 months            KIM, HANNAH R.

## 2012-06-19 ENCOUNTER — Telehealth: Payer: Self-pay | Admitting: Family Medicine

## 2012-06-19 NOTE — Telephone Encounter (Signed)
Labs look good. Cholesterol mildy elevated - no changes in medications needed.

## 2012-06-20 NOTE — Telephone Encounter (Signed)
Left a message for pt to return call 

## 2012-06-25 NOTE — Telephone Encounter (Signed)
Called and spoke with pt and pt is aware.  

## 2012-08-12 ENCOUNTER — Other Ambulatory Visit: Payer: Self-pay

## 2012-08-12 MED ORDER — LISINOPRIL 40 MG PO TABS: 40.0000 mg | ORAL_TABLET | Freq: Every day | ORAL | Status: DC

## 2012-08-12 NOTE — Telephone Encounter (Signed)
Rx for lisinopril 40 mg #90 sent to pharmacy.

## 2012-08-31 ENCOUNTER — Other Ambulatory Visit: Payer: Self-pay | Admitting: Family Medicine

## 2012-11-29 ENCOUNTER — Encounter (HOSPITAL_COMMUNITY): Payer: Self-pay | Admitting: *Deleted

## 2012-11-29 ENCOUNTER — Emergency Department (HOSPITAL_COMMUNITY): Payer: Medicare Other

## 2012-11-29 ENCOUNTER — Inpatient Hospital Stay (HOSPITAL_COMMUNITY)
Admission: EM | Admit: 2012-11-29 | Discharge: 2012-12-01 | DRG: 603 | Disposition: A | Discharge status: Home Health | Payer: Medicare Other | Attending: Internal Medicine | Admitting: Internal Medicine

## 2012-11-29 DIAGNOSIS — A419 Sepsis, unspecified organism: Secondary | ICD-10-CM

## 2012-11-29 DIAGNOSIS — R42 Dizziness and giddiness: Secondary | ICD-10-CM | POA: Diagnosis not present

## 2012-11-29 DIAGNOSIS — E876 Hypokalemia: Secondary | ICD-10-CM | POA: Diagnosis present

## 2012-11-29 DIAGNOSIS — E039 Hypothyroidism, unspecified: Secondary | ICD-10-CM

## 2012-11-29 DIAGNOSIS — G20A1 Parkinson's disease without dyskinesia, without mention of fluctuations: Secondary | ICD-10-CM | POA: Diagnosis present

## 2012-11-29 DIAGNOSIS — F333 Major depressive disorder, recurrent, severe with psychotic symptoms: Secondary | ICD-10-CM

## 2012-11-29 DIAGNOSIS — R404 Transient alteration of awareness: Secondary | ICD-10-CM | POA: Diagnosis not present

## 2012-11-29 DIAGNOSIS — Z79899 Other long term (current) drug therapy: Secondary | ICD-10-CM | POA: Diagnosis not present

## 2012-11-29 DIAGNOSIS — Z823 Family history of stroke: Secondary | ICD-10-CM

## 2012-11-29 DIAGNOSIS — S0993XA Unspecified injury of face, initial encounter: Secondary | ICD-10-CM | POA: Diagnosis not present

## 2012-11-29 DIAGNOSIS — L02419 Cutaneous abscess of limb, unspecified: Secondary | ICD-10-CM | POA: Diagnosis not present

## 2012-11-29 DIAGNOSIS — I10 Essential (primary) hypertension: Secondary | ICD-10-CM | POA: Diagnosis present

## 2012-11-29 DIAGNOSIS — E785 Hyperlipidemia, unspecified: Secondary | ICD-10-CM | POA: Diagnosis present

## 2012-11-29 DIAGNOSIS — B351 Tinea unguium: Secondary | ICD-10-CM

## 2012-11-29 DIAGNOSIS — G20 Parkinson's disease: Secondary | ICD-10-CM | POA: Diagnosis present

## 2012-11-29 DIAGNOSIS — S199XXA Unspecified injury of neck, initial encounter: Secondary | ICD-10-CM | POA: Diagnosis not present

## 2012-11-29 DIAGNOSIS — Z66 Do not resuscitate: Secondary | ICD-10-CM | POA: Diagnosis present

## 2012-11-29 DIAGNOSIS — K5730 Diverticulosis of large intestine without perforation or abscess without bleeding: Secondary | ICD-10-CM | POA: Diagnosis present

## 2012-11-29 DIAGNOSIS — Z8042 Family history of malignant neoplasm of prostate: Secondary | ICD-10-CM | POA: Diagnosis not present

## 2012-11-29 DIAGNOSIS — R5381 Other malaise: Secondary | ICD-10-CM | POA: Diagnosis not present

## 2012-11-29 DIAGNOSIS — S0990XA Unspecified injury of head, initial encounter: Secondary | ICD-10-CM | POA: Diagnosis not present

## 2012-11-29 DIAGNOSIS — L03119 Cellulitis of unspecified part of limb: Secondary | ICD-10-CM | POA: Diagnosis not present

## 2012-11-29 DIAGNOSIS — L03116 Cellulitis of left lower limb: Secondary | ICD-10-CM

## 2012-11-29 LAB — URINALYSIS, ROUTINE W REFLEX MICROSCOPIC
Bilirubin Urine: NEGATIVE
Glucose, UA: NEGATIVE mg/dL
Ketones, ur: NEGATIVE mg/dL
Nitrite: NEGATIVE
Protein, ur: 30 mg/dL — AB
Urobilinogen, UA: 1 mg/dL (ref 0.0–1.0)
pH: 7.5 (ref 5.0–8.0)

## 2012-11-29 LAB — URINE MICROSCOPIC-ADD ON

## 2012-11-29 LAB — CBC WITH DIFFERENTIAL/PLATELET
Basophils Absolute: 0 10*3/uL (ref 0.0–0.1)
Eosinophils Absolute: 0 10*3/uL (ref 0.0–0.7)
Eosinophils Relative: 0 % (ref 0–5)
MCH: 32.8 pg (ref 26.0–34.0)
MCHC: 34.8 g/dL (ref 30.0–36.0)
MCV: 94.4 fL (ref 78.0–100.0)
Monocytes Absolute: 0.8 10*3/uL (ref 0.1–1.0)
Platelets: 128 10*3/uL — ABNORMAL LOW (ref 150–400)
RDW: 13.7 % (ref 11.5–15.5)
WBC: 14.3 10*3/uL — ABNORMAL HIGH (ref 4.0–10.5)

## 2012-11-29 LAB — COMPREHENSIVE METABOLIC PANEL
ALT: 17 U/L (ref 0–35)
AST: 28 U/L (ref 0–37)
Calcium: 9.2 mg/dL (ref 8.4–10.5)
Creatinine, Ser: 0.98 mg/dL (ref 0.50–1.10)
GFR calc Af Amer: 58 mL/min — ABNORMAL LOW (ref >90)
Sodium: 140 mEq/L (ref 135–145)
Total Protein: 7.8 g/dL (ref 6.0–8.3)

## 2012-11-29 LAB — CG4 I-STAT (LACTIC ACID): Lactic Acid, Venous: 2.51 mmol/L — ABNORMAL HIGH (ref 0.5–2.2)

## 2012-11-29 MED ORDER — ACETAMINOPHEN 325 MG PO TABS
650.0000 mg | ORAL_TABLET | Freq: Four times a day (QID) | ORAL | Status: DC | PRN
  Administered 2012-11-29: 650 mg via ORAL
  Filled 2012-11-29: qty 2

## 2012-11-29 MED ORDER — CLINDAMYCIN PHOSPHATE 900 MG/50ML IV SOLN
900.0000 mg | Freq: Once | INTRAVENOUS | Status: AC
  Administered 2012-11-29: 900 mg via INTRAVENOUS
  Filled 2012-11-29: qty 50

## 2012-11-29 NOTE — ED Notes (Signed)
Lactic acid results shown to Dr. Zavitz 

## 2012-11-29 NOTE — ED Provider Notes (Addendum)
CSN: [id number]     Arrival date & time 11/29/12  2134 History   First MD Initiated Contact with Patient 11/29/12 2136     Chief Complaint  Patient presents with  . Dizziness  . Fever   (Consider location/radiation/quality/duration/timing/severity/associated sxs/prior Treatment) The history is provided by the patient.  71 y F with PMH of HTN, hypothyroid and Parkinson's here after she fell out of her bed tonight. Her family is unsure what caused her to fall. Her husband was unable to get her up so he called EMS. EMS transported her to the emergency department. Vital signs were stable in route. She was noted to be hypertensive. She did not lose consciousness during the fall. There was no seizure activity or incontinence reported.  During my initial conversation I noted a red swollen area of her left lower leg. I asked family about this. They were unable to give me definitive details about when this area first appeared. They think it has been there for approximately one week. She has not sought treatment regarding this issue. No reported fevers, vomiting, diarrhea, abdominal pain, chest pain, headache, a weakness, all the numbness or other issues appear  Past Medical History  Diagnosis Date  . Diverticulosis of colon   . Hyperlipidemia   . Hypertension   . Hypothyroidism   . Parkinson disease    Past Surgical History  Procedure Laterality Date  . Abdominal hysterectomy     Family History  Problem Relation Age of Onset  . Stroke Mother   . Cancer Father     prostate   History  Substance Use Topics  . Smoking status: Never Smoker   . Smokeless tobacco: Never Used  . Alcohol Use: No   OB History   Grav Para Term Preterm Abortions TAB SAB Ect Mult Living                 Review of Systems  Constitutional: Positive for chills. Negative for fever.  HENT: Negative for neck pain and neck stiffness.   Respiratory: Negative for cough, chest tightness and shortness of breath.    Cardiovascular: Negative for chest pain.  Gastrointestinal: Negative for nausea, vomiting, abdominal pain and diarrhea.  Genitourinary: Negative for dysuria and frequency.  Musculoskeletal: Negative for back pain.  Skin: Negative for rash.  Neurological: Negative for seizures, syncope, weakness and headaches.  Hematological: Negative for adenopathy. Does not bruise/bleed easily.  All other systems reviewed and are negative.    Allergies  Citalopram hydrobromide  Home Medications   Current Outpatient Rx  Name  Route  Sig  Dispense  Refill  . EXPIRED: donepezil (ARICEPT) 5 MG tablet   Oral   Take 1 tablet (5 mg total) by mouth at bedtime.   90 tablet   4   . donepezil (ARICEPT) 5 MG tablet      TAKE 1 TABLET (5 MG TOTAL) BY MOUTH AT BEDTIME AS NEEDED.   90 tablet   4   . levothyroxine (SYNTHROID, LEVOTHROID) 50 MCG tablet      TAKE 1 TABLET BY MOUTH EVERY DAY   30 tablet   6   . lisinopril (PRINIVIL,ZESTRIL) 40 MG tablet   Oral   Take 1 tablet (40 mg total) by mouth daily.   90 tablet   3   . polyethylene glycol powder (GLYCOLAX/MIRALAX) powder      TAKE 17 G BY MOUTH DAILY.   255 g   1    BP 171/52  Pulse 74  Temp(Src) 101 F (36.9 C) (Oral)  Resp 24  SpO2 98% Physical Exam  Vitals reviewed. Constitutional: She is oriented to person, place, and time. She appears well-developed and well-nourished. No distress.  HENT:  Right Ear: External ear normal.  Left Ear: External ear normal.  Mouth/Throat: No oropharyngeal exudate.  Eyes: Conjunctivae and EOM are normal. Pupils are equal, round, and reactive to light.  Neck: Normal range of motion. Neck supple.  Cardiovascular: Normal rate, regular rhythm, normal heart sounds and intact distal pulses.  Exam reveals no gallop and no friction rub.   No murmur heard. Pulmonary/Chest: Effort normal and breath sounds normal.  Abdominal: Soft. Bowel sounds are normal. She exhibits no distension. There is no  tenderness.  Musculoskeletal: Normal range of motion. She exhibits no edema.  Left lower anterior leg for large area of warmth and erythema  Neurological: She is alert and oriented to person, place, and time. She has normal strength. No cranial nerve deficit or sensory deficit.  Skin: Skin is warm and dry. No rash noted.  Psychiatric: She has a normal mood and affect.    ED Course  Procedures (including critical care time) Labs Review Labs Reviewed  CBC WITH DIFFERENTIAL - Abnormal; Notable for the following:    WBC 14.3 (*)    Hemoglobin 15.3 (*)    Platelets 128 (*)    Neutrophils Relative % 92 (*)    Neutro Abs 13.2 (*)    Lymphocytes Relative 2 (*)    Lymphs Abs 0.3 (*)    All other components within normal limits  COMPREHENSIVE METABOLIC PANEL - Abnormal; Notable for the following:    Potassium 3.3 (*)    Glucose, Bld 124 (*)    GFR calc non Af Amer 50 (*)    GFR calc Af Amer 58 (*)    All other components within normal limits  URINALYSIS, ROUTINE W REFLEX MICROSCOPIC - Abnormal; Notable for the following:    Hgb urine dipstick SMALL (*)    Protein, ur 30 (*)    Leukocytes, UA TRACE (*)    All other components within normal limits  URINE MICROSCOPIC-ADD ON - Abnormal; Notable for the following:    Squamous Epithelial / LPF FEW (*)    All other components within normal limits  CG4 I-STAT (LACTIC ACID) - Abnormal; Notable for the following:    Lactic Acid, Venous 2.51 (*)    All other components within normal limits  CBC  BASIC METABOLIC PANEL   Imaging Review Ct Head Wo Contrast  11/29/2012   CLINICAL DATA:  Tammy Branch out of bed, blunt trauma.  Dizzy.  EXAM: CT HEAD WITHOUT CONTRAST  CT CERVICAL SPINE WITHOUT CONTRAST  TECHNIQUE: Multidetector CT imaging of the head and cervical spine was performed following the standard protocol without intravenous contrast. Multiplanar CT image reconstructions of the cervical spine were also generated.  COMPARISON:  06/23/2008  FINDINGS:  CT HEAD FINDINGS  There is no evidence of acute intracranial hemorrhage, brain edema, mass lesion, acute infarction, mass effect, or midline shift. Acute infarct may be in apparent on noncontrast CT. No other intra-axial abnormalities are seen, and the ventricles and sulci are within normal limits in size and symmetry. No abnormal extra-axial fluid collections or masses are identified. No significant calvarial abnormality.  IMPRESSION: 1. Negative for bleed or other acute intracranial process.  CT CERVICAL SPINE FINDINGS  Normal alignment. No prevertebral soft tissue swelling. Asymmetric facet degenerative change C2- 3 right worse than left. Left facet and  uncovertebral hypertrophy C3-4 and C4-5 results in mild foraminal stenosis. Bilateral facet disease C5-6 without compressive pathology. Negative for fracture. Visualized lung apices clear. Bilateral calcified carotid bifurcation plaque.  CT CERVICAL SPINE IMPRESSION  1. Negative for fracture or other acute bone abnormality. 2. Multilevel degenerative changes as above. 3. Calcified bilateral carotid bifurcation plaque.   Electronically Signed   By: Oley Balm M.D.   On: 11/29/2012 22:54   Ct Cervical Spine Wo Contrast  11/29/2012   CLINICAL DATA:  Tammy Branch out of bed, blunt trauma.  Dizzy.  EXAM: CT HEAD WITHOUT CONTRAST  CT CERVICAL SPINE WITHOUT CONTRAST  TECHNIQUE: Multidetector CT imaging of the head and cervical spine was performed following the standard protocol without intravenous contrast. Multiplanar CT image reconstructions of the cervical spine were also generated.  COMPARISON:  06/23/2008  FINDINGS: CT HEAD FINDINGS  There is no evidence of acute intracranial hemorrhage, brain edema, mass lesion, acute infarction, mass effect, or midline shift. Acute infarct may be in apparent on noncontrast CT. No other intra-axial abnormalities are seen, and the ventricles and sulci are within normal limits in size and symmetry. No abnormal extra-axial fluid  collections or masses are identified. No significant calvarial abnormality.  IMPRESSION: 1. Negative for bleed or other acute intracranial process.  CT CERVICAL SPINE FINDINGS  Normal alignment. No prevertebral soft tissue swelling. Asymmetric facet degenerative change C2- 3 right worse than left. Left facet and uncovertebral hypertrophy C3-4 and C4-5 results in mild foraminal stenosis. Bilateral facet disease C5-6 without compressive pathology. Negative for fracture. Visualized lung apices clear. Bilateral calcified carotid bifurcation plaque.  CT CERVICAL SPINE IMPRESSION  1. Negative for fracture or other acute bone abnormality. 2. Multilevel degenerative changes as above. 3. Calcified bilateral carotid bifurcation plaque.   Electronically Signed   By: Oley Balm M.D.   On: 11/29/2012 22:54   Dg Chest Portable 1 View  11/29/2012   CLINICAL DATA:  Dizziness, seizure  EXAM: PORTABLE CHEST - 1 VIEW  COMPARISON:  07/08/2008  FINDINGS: Mild cardiomegaly. Atheromatous aortic arch. Patchy interstitial infiltrates or subsegmental atelectasis in both lung bases. Blunting of the left lateral costophrenic angle suggesting small effusion. No overt interstitial edema.  IMPRESSION: 1. Mild cardiomegaly. 2. Mild bibasilar interstitial infiltrates or atelectasis. 3. Question small left pleural effusion.   Electronically Signed   By: Oley Balm M.D.   On: 11/29/2012 22:36    Date: 11/29/2012  Rate: 76  Rhythm: normal sinus rhythm  QRS Axis: left  Intervals: QT prolonged  ST/T Wave abnormalities: T wave abnormalities in the lateral leads  Conduction Disutrbances:none  Narrative Interpretation: NSR, LVH, lateral T wave abnormalities  Old EKG Reviewed: none available    MDM   Febrile on arrival. Large area of erythema to her left lower leg consistent with cellulitis.   CBC, CMP, urine studies, lactate, chest x-ray.  CT head and C-spine given the fall.  12:05 AM Patient to be admitted to the internal  medicine service.  The family and the patient were updated. Clindamycin given.  Clinical Impression: 1. Cellulitis of left leg   2. Sepsis     Disposition: Admit  Condition: Fair   I have discussed the results, Dx and Tx plan. They understand and agree with plan for admission.  Exam unchanged at admission.   Pt seen in conjunction with Dr. Jodi Mourning.  Reine Just. Beverely Pace, MD Emergency Medicine PGY-III [phone]   Oleh Genin, MD 11/30/12 [phone]  Medical screening examination/treatment/procedure(s) were conducted as a shared visit  with non-physician practitioner(s) or resident  and myself.  I personally evaluated the patient during the encounter and agree with the findings and plan unless otherwise indicated.    EKG was reviewed with resident and I agree with interpretation.   Rolled out of bed, mild head and neck injury, cellulitis left leg, family unsure length of time.  General weaness on exam, left leg anterior warmth/ induration/ no crepitus, slow movements.  IM admission for cellulitis, SIRS, general weakness, fall.   Enid Skeens, MD 12/01/12 4540  Enid Skeens, MD 12/27/12 [phone]

## 2012-11-29 NOTE — ED Notes (Signed)
2100: Dizzy while in bed; rolled off bed and hit the back of head. Prior to situation, pt. Was not feeling well. Could not finish her dinner b/c of trouble swallowing. Usually has difficulty swallowing. cbg 133

## 2012-11-29 NOTE — ED Notes (Signed)
Family at bedside. 

## 2012-11-30 ENCOUNTER — Encounter (HOSPITAL_COMMUNITY): Payer: Self-pay | Admitting: *Deleted

## 2012-11-30 DIAGNOSIS — L03116 Cellulitis of left lower limb: Secondary | ICD-10-CM | POA: Diagnosis present

## 2012-11-30 DIAGNOSIS — L02419 Cutaneous abscess of limb, unspecified: Principal | ICD-10-CM

## 2012-11-30 HISTORY — DX: Cellulitis of left lower limb: L03.116

## 2012-11-30 LAB — BASIC METABOLIC PANEL
Calcium: 8.8 mg/dL (ref 8.4–10.5)
Chloride: 99 mEq/L (ref 96–112)
Creatinine, Ser: 1.04 mg/dL (ref 0.50–1.10)
GFR calc non Af Amer: 46 mL/min — ABNORMAL LOW (ref >90)
Sodium: 137 mEq/L (ref 135–145)

## 2012-11-30 LAB — CBC
MCH: 32.1 pg (ref 26.0–34.0)
MCHC: 34.1 g/dL (ref 30.0–36.0)
Platelets: 128 10*3/uL — ABNORMAL LOW (ref 150–400)
RDW: 13.9 % (ref 11.5–15.5)

## 2012-11-30 MED ORDER — CLINDAMYCIN PHOSPHATE 900 MG/50ML IV SOLN
900.0000 mg | Freq: Three times a day (TID) | INTRAVENOUS | Status: DC
  Administered 2012-11-30 – 2012-12-01 (×4): 900 mg via INTRAVENOUS
  Filled 2012-11-30 (×9): qty 50

## 2012-11-30 MED ORDER — LISINOPRIL 40 MG PO TABS
40.0000 mg | ORAL_TABLET | Freq: Every day | ORAL | Status: DC
  Administered 2012-11-30 – 2012-12-01 (×2): 40 mg via ORAL
  Filled 2012-11-30 (×2): qty 1

## 2012-11-30 MED ORDER — SODIUM CHLORIDE 0.9 % IJ SOLN: 3.0000 mL | Freq: Two times a day (BID) | INTRAMUSCULAR | Status: DC

## 2012-11-30 MED ORDER — LEVOTHYROXINE SODIUM 50 MCG PO TABS
50.0000 ug | ORAL_TABLET | Freq: Every day | ORAL | Status: DC
  Administered 2012-11-30 – 2012-12-01 (×2): 50 ug via ORAL
  Filled 2012-11-30 (×3): qty 1

## 2012-11-30 MED ORDER — HEPARIN SODIUM (PORCINE) 5000 UNIT/ML IJ SOLN
5000.0000 [IU] | Freq: Three times a day (TID) | INTRAMUSCULAR | Status: DC
  Administered 2012-11-30 – 2012-12-01 (×4): 5000 [IU] via SUBCUTANEOUS
  Filled 2012-11-30 (×8): qty 1

## 2012-11-30 MED ORDER — DONEPEZIL HCL 5 MG PO TABS
5.0000 mg | ORAL_TABLET | Freq: Every day | ORAL | Status: DC
  Administered 2012-11-30: 5 mg via ORAL
  Filled 2012-11-30 (×3): qty 1

## 2012-11-30 NOTE — Progress Notes (Signed)
Patient seen and evaluated earlier this am by my associate. Please refer to the H and P for the assessment and plan.  Will plan on evaluating tomorrow. Gave update to family and discussed changing from IV abx's to oral antibiotics pending improvement in condition.  PT consulted given history of Parkinsons  Tammy Branch, Pamala Hurry

## 2012-11-30 NOTE — Evaluation (Signed)
Physical Therapy Evaluation Patient Details Name: Tammy Branch MRN: [id number] DOB: 1921-08-05 Today's Date: 11/30/2012 Time: 4098-1191 PT Time Calculation (min): 29 min  PT Assessment / Plan / Recommendation History of Present Illness  Tammy Branch is a 79 y.o. female pmh of parkinson's, presents to the ED after a fall.  Has been feeling weak this afternoon as well.  No pain anywhere or specific symptoms other than some L ankle pain earlier.  No injury during fall.  Patient brought into ED by family.  Found to have LLE cellulitis in ED.  Clinical Impression  Pt admitted with above. Pt currently with functional limitations due to the deficits listed below (see PT Problem List).   Pt will benefit from skilled PT to increase their independence and safety with mobility to allow discharge to the venue listed below.        PT Assessment  Patient needs continued PT services    Follow Up Recommendations  Home health PT;Supervision/Assistance - 24 hour    Does the patient have the potential to tolerate intense rehabilitation      Barriers to Discharge Decreased caregiver support Husband can only give Supervision assist    Equipment Recommendations  Rolling walker with 5" wheels;3in1 (PT)    Recommendations for Other Services OT consult   Frequency Min 3X/week    Precautions / Restrictions Precautions Precautions: Fall   Pertinent Vitals/Pain no apparent distress Ntoed LLE more erythematous after walking; educated pt in elevating extremity      Mobility  Bed Mobility Bed Mobility: Not assessed Details for Bed Mobility Assistance: in chair upon arrival Transfers Transfers: Sit to Stand;Stand to Sit Sit to Stand: 3: Mod assist;With armrests;From chair/3-in-1 Stand to Sit: 4: Min assist;With armrests;To chair/3-in-1 Details for Transfer Assistance: Cues for hand placement and safety Ambulation/Gait Ambulation/Gait Assistance: 4: Min guard Ambulation Distance (Feet):  100 Feet Assistive device: Rolling walker Ambulation/Gait Assistance Details: Cues for posture, RW proximity, and to unweigh painful LLE by pushing down into RW Gait Pattern: Decreased stride length Gait velocity: slowed    Exercises     PT Diagnosis: Difficulty walking;Generalized weakness  PT Problem List: Decreased strength;Decreased activity tolerance;Decreased balance;Decreased mobility;Decreased coordination;Decreased knowledge of use of DME;Decreased knowledge of precautions;Pain PT Treatment Interventions: DME instruction;Gait training;Stair training;Functional mobility training;Therapeutic activities;Therapeutic exercise;Balance training;Neuromuscular re-education     PT Goals(Current goals can be found in the care plan section) Acute Rehab PT Goals Patient Stated Goal: did not state PT Goal Formulation: With patient/family Time For Goal Achievement: 12/14/12 Potential to Achieve Goals: Good  Visit Information  Last PT Received On: 11/30/12 Assistance Needed: +1 History of Present Illness: Tammy Branch is a 79 y.o. female pmh of parkinson's, presents to the ED after a fall.  Has been feeling weak this afternoon as well.  No pain anywhere or specific symptoms other than some L ankle pain earlier.  No injury during fall.  Patient brought into ED by family.  Found to have LLE cellulitis in ED.       Prior Functioning  Home Living Family/patient expects to be discharged to:: Private residence Living Arrangements: Spouse/significant other Available Help at Discharge: Family;Available 24 hours/day (husband able to give S assist/home management) Type of Home: House Home Access: Stairs to enter [employer] of Steps: 3 Entrance Stairs-Rails: Right Home Layout: One level Home Equipment:  (to be determined) Communication Communication:  (slow to answer questions)    Cognition  Cognition Arousal/Alertness: Awake/alert Behavior During Therapy: [employer] for tasks  assessed/performed;Flat affect Overall Cognitive Status: Within Functional Limits for tasks assessed    Extremity/Trunk Assessment Upper Extremity Assessment Upper Extremity Assessment: Generalized weakness Lower Extremity Assessment Lower Extremity Assessment: Generalized weakness   Balance    End of Session PT - End of Session Equipment Utilized During Treatment: Gait belt Activity Tolerance: Patient tolerated treatment well Patient left: in chair;with call bell/phone within reach;with family/visitor present Nurse Communication: Mobility status  GP     Olen Pel Buchtel, Bartholomew [phone]  11/30/2012, 5:21 PM

## 2012-11-30 NOTE — ED Notes (Signed)
Family at bedside. 

## 2012-11-30 NOTE — Progress Notes (Signed)
Utilization Review completed.  

## 2012-11-30 NOTE — H&P (Addendum)
[facility] History and Physical  BRAYLEE LAL ZOX:[id number] DOB: February 07, 1922 DOA: 11/29/2012  Referring physician: ED PCP: [name]., DO   Chief Complaint: Fall, generalized weakness  HPI: Tammy Branch is a 79 y.o. female pmh of parkinson's, presents to the ED after a fall.  Has been feeling weak this afternoon as well.  No pain anywhere or specific symptoms other than some L ankle pain earlier.  No injury during fall.  Patient brought into ED by family.  Found to have LLE cellulitis in ED.  Review of Systems: 12 systems reviewed and otherwise negative.  Past Medical History  Diagnosis Date  . Diverticulosis of colon   . Hyperlipidemia   . Hypertension   . Hypothyroidism   . Parkinson disease    Past Surgical History  Procedure Laterality Date  . Abdominal hysterectomy     Social History:  reports that she has never smoked. She has never used smokeless tobacco. She reports that she does not drink alcohol or use illicit drugs.   Allergies  Allergen Reactions  . Citalopram Hydrobromide     REACTION: hallucinations    Family History  Problem Relation Age of Onset  . Stroke Mother   . Cancer Father     prostate    Prior to Admission medications   Medication Sig Start Date End Date Taking? Authorizing Provider  donepezil (ARICEPT) 5 MG tablet Take 5 mg by mouth at bedtime.   Yes Historical Provider, MD  levothyroxine (SYNTHROID, LEVOTHROID) 50 MCG tablet Take 50 mcg by mouth daily before breakfast.   Yes Historical Provider, MD  lisinopril (PRINIVIL,ZESTRIL) 40 MG tablet Take 1 tablet (40 mg total) by mouth daily. 08/12/12  Yes [name], DO   Physical Exam: Filed Vitals:   11/29/12 2307  BP:   Pulse:   Temp: 98.4 F (36.9 C)  Resp:     General:  NAD, resting comfortably in bed, sleepy Eyes: PEERLA EOMI ENT: mucous membranes moist Neck: supple w/o JVD Cardiovascular: RRR w/o MRG Respiratory: CTA B Abdomen: soft, nt, nd, bs+ Skin: no  rash nor lesion Musculoskeletal: MAE, full ROM all 4 extremities Psychiatric: normal tone and affect Neurologic: AAOx3, grossly non-focal  Labs on Admission:  Basic Metabolic Panel:  Recent Labs Lab 11/29/12 2146  NA 140  K 3.3*  CL 101  CO2 28  GLUCOSE 124*  BUN 21  CREATININE 0.98  CALCIUM 9.2   Liver Function Tests:  Recent Labs Lab 11/29/12 2146  AST 28  ALT 17  ALKPHOS 79  BILITOT 0.8  PROT 7.8  ALBUMIN 4.2   No results found for this basename: LIPASE, AMYLASE,  in the last 168 hours No results found for this basename: AMMONIA,  in the last 168 hours CBC:  Recent Labs Lab 11/29/12 2146  WBC 14.3*  NEUTROABS 13.2*  HGB 15.3*  HCT 44.0  MCV 94.4  PLT 128*   Cardiac Enzymes: No results found for this basename: CKTOTAL, CKMB, CKMBINDEX, TROPONINI,  in the last 168 hours  BNP (last 3 results) No results found for this basename: PROBNP,  in the last 8760 hours CBG: No results found for this basename: GLUCAP,  in the last 168 hours  Radiological Exams on Admission: Ct Head Wo Contrast  11/29/2012   CLINICAL DATA:  Claudia Pollock out of bed, blunt trauma.  Dizzy.  EXAM: CT HEAD WITHOUT CONTRAST  CT CERVICAL SPINE WITHOUT CONTRAST  TECHNIQUE: Multidetector CT imaging of the head and cervical spine was  performed following the standard protocol without intravenous contrast. Multiplanar CT image reconstructions of the cervical spine were also generated.  COMPARISON:  06/23/2008  FINDINGS: CT HEAD FINDINGS  There is no evidence of acute intracranial hemorrhage, brain edema, mass lesion, acute infarction, mass effect, or midline shift. Acute infarct may be in apparent on noncontrast CT. No other intra-axial abnormalities are seen, and the ventricles and sulci are within normal limits in size and symmetry. No abnormal extra-axial fluid collections or masses are identified. No significant calvarial abnormality.  IMPRESSION: 1. Negative for bleed or other acute intracranial process.   CT CERVICAL SPINE FINDINGS  Normal alignment. No prevertebral soft tissue swelling. Asymmetric facet degenerative change C2- 3 right worse than left. Left facet and uncovertebral hypertrophy C3-4 and C4-5 results in mild foraminal stenosis. Bilateral facet disease C5-6 without compressive pathology. Negative for fracture. Visualized lung apices clear. Bilateral calcified carotid bifurcation plaque.  CT CERVICAL SPINE IMPRESSION  1. Negative for fracture or other acute bone abnormality. 2. Multilevel degenerative changes as above. 3. Calcified bilateral carotid bifurcation plaque.   Electronically Signed   By: Oley Balm M.D.   On: 11/29/2012 22:54   Ct Cervical Spine Wo Contrast  11/29/2012   CLINICAL DATA:  Claudia Pollock out of bed, blunt trauma.  Dizzy.  EXAM: CT HEAD WITHOUT CONTRAST  CT CERVICAL SPINE WITHOUT CONTRAST  TECHNIQUE: Multidetector CT imaging of the head and cervical spine was performed following the standard protocol without intravenous contrast. Multiplanar CT image reconstructions of the cervical spine were also generated.  COMPARISON:  06/23/2008  FINDINGS: CT HEAD FINDINGS  There is no evidence of acute intracranial hemorrhage, brain edema, mass lesion, acute infarction, mass effect, or midline shift. Acute infarct may be in apparent on noncontrast CT. No other intra-axial abnormalities are seen, and the ventricles and sulci are within normal limits in size and symmetry. No abnormal extra-axial fluid collections or masses are identified. No significant calvarial abnormality.  IMPRESSION: 1. Negative for bleed or other acute intracranial process.  CT CERVICAL SPINE FINDINGS  Normal alignment. No prevertebral soft tissue swelling. Asymmetric facet degenerative change C2- 3 right worse than left. Left facet and uncovertebral hypertrophy C3-4 and C4-5 results in mild foraminal stenosis. Bilateral facet disease C5-6 without compressive pathology. Negative for fracture. Visualized lung apices clear.  Bilateral calcified carotid bifurcation plaque.  CT CERVICAL SPINE IMPRESSION  1. Negative for fracture or other acute bone abnormality. 2. Multilevel degenerative changes as above. 3. Calcified bilateral carotid bifurcation plaque.   Electronically Signed   By: Oley Balm M.D.   On: 11/29/2012 22:54   Dg Chest Portable 1 View  11/29/2012   CLINICAL DATA:  Dizziness, seizure  EXAM: PORTABLE CHEST - 1 VIEW  COMPARISON:  07/08/2008  FINDINGS: Mild cardiomegaly. Atheromatous aortic arch. Patchy interstitial infiltrates or subsegmental atelectasis in both lung bases. Blunting of the left lateral costophrenic angle suggesting small effusion. No overt interstitial edema.  IMPRESSION: 1. Mild cardiomegaly. 2. Mild bibasilar interstitial infiltrates or atelectasis. 3. Question small left pleural effusion.   Electronically Signed   By: Oley Balm M.D.   On: 11/29/2012 22:36    EKG: Independently reviewed.  Assessment/Plan Principal Problem:   Cellulitis of left lower leg Active Problems:   PARKINSON'S DISEASE   1. LLE cellulitis - putting patient on clindamycin, admitting for observation given her generalized weakness and high risk of falls.  PT/OT, sounds like she is supposed to be using a cane or walker at home given her slow  shuffling gait with parkinson's but does not use these ever.  Tylenol for fever, recheck labs in AM, mild leukocytosis as well, does not appear to have major systemic involvement otherwise at this time.   Code Status: DNR (must indicate code status--if unknown or must be presumed, indicate so) Family Communication: Spoke with family at bedside (indicate person spoken with, if applicable, with phone number if by telephone) Disposition Plan: Admit to obs (indicate anticipated LOS)  Time spent: 50 min  Tulani Kidney M. [facility] Pager [phone]  If 7PM-7AM, please contact night-coverage [email or web address] Password [facility] 11/30/2012, 12:19 AM

## 2012-12-01 DIAGNOSIS — E876 Hypokalemia: Secondary | ICD-10-CM

## 2012-12-01 DIAGNOSIS — E039 Hypothyroidism, unspecified: Secondary | ICD-10-CM

## 2012-12-01 DIAGNOSIS — I10 Essential (primary) hypertension: Secondary | ICD-10-CM

## 2012-12-01 LAB — CBC
HCT: 36.9 % (ref 36.0–46.0)
MCHC: 33.9 g/dL (ref 30.0–36.0)
MCV: 93.2 fL (ref 78.0–100.0)
Platelets: 129 10*3/uL — ABNORMAL LOW (ref 150–400)
RBC: 3.96 MIL/uL (ref 3.87–5.11)
RDW: 14.1 % (ref 11.5–15.5)
WBC: 7.4 10*3/uL (ref 4.0–10.5)

## 2012-12-01 MED ORDER — CLINDAMYCIN HCL 300 MG PO CAPS
300.0000 mg | ORAL_CAPSULE | Freq: Four times a day (QID) | ORAL | Status: DC
Start: 2012-12-01 — End: 2012-12-01
  Administered 2012-12-01: 300 mg via ORAL
  Filled 2012-12-01 (×4): qty 1

## 2012-12-01 MED ORDER — POTASSIUM CHLORIDE CRYS ER 20 MEQ PO TBCR: 40.0000 meq | EXTENDED_RELEASE_TABLET | Freq: Once | ORAL | Status: DC

## 2012-12-01 MED ORDER — CLINDAMYCIN HCL 300 MG PO CAPS: 300.0000 mg | ORAL_CAPSULE | Freq: Four times a day (QID) | ORAL | Status: DC

## 2012-12-01 NOTE — Discharge Summary (Signed)
Physician Discharge Summary  Tammy Branch ZOX:[id number] DOB: [birth year] DOA: 11/29/2012  PCP: Kriste Basque R., DO  Admit date: 11/29/2012 Discharge date: 12/01/2012  Time spent: >35 minutes  Recommendations for Outpatient Follow-up:  1. F/u with cbc and consider prolonging antibiotic therapy should cellulitis persist 2. Recheck potassium levels   Discharge Diagnoses:  Principal Problem:   Cellulitis of left lower leg Active Problems:   PARKINSON'S DISEASE   Hypokalemia   Discharge Condition: stable  Diet recommendation: Low sodium  Filed Weights   11/30/12 0242  Weight: 67 kg (147 lb 11.3 oz)    History of present illness:  From original HPI: Tammy Branch is a 79 y.o. female pmh of parkinson's, presents to the ED after a fall. Has been feeling weak this afternoon as well. No pain anywhere or specific symptoms other than some L ankle pain earlier. No injury during fall. Patient brought into ED by family. Found to have LLE cellulitis in ED.  Hospital Course:   1. Left lower leg cellulitis - continue clindamycin once discharged given improvement in cellulitis.  Erythema has receded considerably from initial marked borders.  2. Parkinson's  - PT has evaluated and recommended HH with PT and rolling walker. Orders placed. - continue aricept   3. Hypokalemia  -will replace with 40 meq of potassium oral  - Recommend having K level rechecked when she follows up with her PCP   Procedures:  None  Consultations:  none  Discharge Exam: Filed Vitals:   12/01/12 0648  BP: 152/56  Pulse: 74  Temp: 98.2 F (36.8 C)  Resp: 18    General: Pt in NAD, Alert and Awake Cardiovascular: RRR, No MRG Respiratory: CTA BL, no wheezes  Discharge Instructions  Discharge Orders   Future Appointments Provider Department Dept Phone   12/09/2012 11:15 AM [name], DO [facility] [phone]   Future Orders Complete By Expires   Call MD for:   extreme fatigue  As directed    Call MD for:  persistant dizziness or light-headedness  As directed    Call MD for:  temperature >100.4  As directed    Diet - low sodium heart healthy  As directed    Discharge instructions  As directed    Comments:     Please be sure to follow up with your primary care physician in 1-2 weeks or sooner should any new concerns arise.   Increase activity slowly  As directed        Medication List         clindamycin 300 MG capsule  Commonly known as:  CLEOCIN  Take 1 capsule (300 mg total) by mouth every 6 (six) hours.     donepezil 5 MG tablet  Commonly known as:  ARICEPT  Take 5 mg by mouth at bedtime.     levothyroxine 50 MCG tablet  Commonly known as:  SYNTHROID, LEVOTHROID  Take 50 mcg by mouth daily before breakfast.     lisinopril 40 MG tablet  Commonly known as:  PRINIVIL,ZESTRIL  Take 1 tablet (40 mg total) by mouth daily.       Allergies  Allergen Reactions  . Citalopram Hydrobromide     REACTION: hallucinations      The results of significant diagnostics from this hospitalization (including imaging, microbiology, ancillary and laboratory) are listed below for reference.    Significant Diagnostic Studies: Ct Head Wo Contrast  11/29/2012   CLINICAL DATA:  Tammy Branch out of bed,  blunt trauma.  Dizzy.  EXAM: CT HEAD WITHOUT CONTRAST  CT CERVICAL SPINE WITHOUT CONTRAST  TECHNIQUE: Multidetector CT imaging of the head and cervical spine was performed following the standard protocol without intravenous contrast. Multiplanar CT image reconstructions of the cervical spine were also generated.  COMPARISON:  06/23/2008  FINDINGS: CT HEAD FINDINGS  There is no evidence of acute intracranial hemorrhage, brain edema, mass lesion, acute infarction, mass effect, or midline shift. Acute infarct may be in apparent on noncontrast CT. No other intra-axial abnormalities are seen, and the ventricles and sulci are within normal limits in size and symmetry. No  abnormal extra-axial fluid collections or masses are identified. No significant calvarial abnormality.  IMPRESSION: 1. Negative for bleed or other acute intracranial process.  CT CERVICAL SPINE FINDINGS  Normal alignment. No prevertebral soft tissue swelling. Asymmetric facet degenerative change C2- 3 right worse than left. Left facet and uncovertebral hypertrophy C3-4 and C4-5 results in mild foraminal stenosis. Bilateral facet disease C5-6 without compressive pathology. Negative for fracture. Visualized lung apices clear. Bilateral calcified carotid bifurcation plaque.  CT CERVICAL SPINE IMPRESSION  1. Negative for fracture or other acute bone abnormality. 2. Multilevel degenerative changes as above. 3. Calcified bilateral carotid bifurcation plaque.   Electronically Signed   By: Oley Balm M.D.   On: 11/29/2012 22:54   Ct Cervical Spine Wo Contrast  11/29/2012   CLINICAL DATA:  Tammy Branch out of bed, blunt trauma.  Dizzy.  EXAM: CT HEAD WITHOUT CONTRAST  CT CERVICAL SPINE WITHOUT CONTRAST  TECHNIQUE: Multidetector CT imaging of the head and cervical spine was performed following the standard protocol without intravenous contrast. Multiplanar CT image reconstructions of the cervical spine were also generated.  COMPARISON:  06/23/2008  FINDINGS: CT HEAD FINDINGS  There is no evidence of acute intracranial hemorrhage, brain edema, mass lesion, acute infarction, mass effect, or midline shift. Acute infarct may be in apparent on noncontrast CT. No other intra-axial abnormalities are seen, and the ventricles and sulci are within normal limits in size and symmetry. No abnormal extra-axial fluid collections or masses are identified. No significant calvarial abnormality.  IMPRESSION: 1. Negative for bleed or other acute intracranial process.  CT CERVICAL SPINE FINDINGS  Normal alignment. No prevertebral soft tissue swelling. Asymmetric facet degenerative change C2- 3 right worse than left. Left facet and uncovertebral  hypertrophy C3-4 and C4-5 results in mild foraminal stenosis. Bilateral facet disease C5-6 without compressive pathology. Negative for fracture. Visualized lung apices clear. Bilateral calcified carotid bifurcation plaque.  CT CERVICAL SPINE IMPRESSION  1. Negative for fracture or other acute bone abnormality. 2. Multilevel degenerative changes as above. 3. Calcified bilateral carotid bifurcation plaque.   Electronically Signed   By: Oley Balm M.D.   On: 11/29/2012 22:54   Dg Chest Portable 1 View  11/29/2012   CLINICAL DATA:  Dizziness, seizure  EXAM: PORTABLE CHEST - 1 VIEW  COMPARISON:  07/08/2008  FINDINGS: Mild cardiomegaly. Atheromatous aortic arch. Patchy interstitial infiltrates or subsegmental atelectasis in both lung bases. Blunting of the left lateral costophrenic angle suggesting small effusion. No overt interstitial edema.  IMPRESSION: 1. Mild cardiomegaly. 2. Mild bibasilar interstitial infiltrates or atelectasis. 3. Question small left pleural effusion.   Electronically Signed   By: Oley Balm M.D.   On: 11/29/2012 22:36    Microbiology: No results found for this or any previous visit (from the past 240 hour(s)).   Labs: Basic Metabolic Panel:  Recent Labs Lab 11/29/12 2146 11/30/12 0555  NA 140 137  K 3.3* 3.0*  CL 101 99  CO2 28 26  GLUCOSE 124* 105*  BUN 21 22  CREATININE 0.98 1.04  CALCIUM 9.2 8.8   Liver Function Tests:  Recent Labs Lab 11/29/12 2146  AST 28  ALT 17  ALKPHOS 79  BILITOT 0.8  PROT 7.8  ALBUMIN 4.2   No results found for this basename: LIPASE, AMYLASE,  in the last 168 hours No results found for this basename: AMMONIA,  in the last 168 hours CBC:  Recent Labs Lab 11/29/12 2146 11/30/12 0555  WBC 14.3* 15.3*  NEUTROABS 13.2*  --   HGB 15.3* 12.9  HCT 44.0 37.8  MCV 94.4 94.0  PLT 128* 128*   Cardiac Enzymes: No results found for this basename: CKTOTAL, CKMB, CKMBINDEX, TROPONINI,  in the last 168 hours BNP: BNP (last  3 results) No results found for this basename: PROBNP,  in the last 8760 hours CBG: No results found for this basename: GLUCAP,  in the last 168 hours     Signed:  Penny Pia  [facility] 12/01/2012, 11:53 AM

## 2012-12-01 NOTE — Evaluation (Signed)
Occupational Therapy Evaluation Patient Details Name: Tammy Branch MRN: [id number] DOB: 1921-07-29 Today's Date: 12/01/2012 Time: 1245-1316 OT Time Calculation (min): 31 min  OT Assessment / Plan / Recommendation History of present illness Tammy Branch is a 79 y.o. female pmh of parkinson's, presents to the ED after a fall.  Has been feeling weak this afternoon as well.  No pain anywhere or specific symptoms other than some L ankle pain earlier.  No injury during fall.  Patient brought into ED by family.  Found to have LLE cellulitis in ED.   Clinical Impression   Pt demos decline in function and safety with ADLs and ADL mobility. Pt requires set up/sup with UB ADLs, mod A with LB ADLs and min A with ADL/functionla mobility. Pt will have 24 hour sup and assist with HH therapy at home once d/c from acute care. All education completed and no further acute OT services needed at this time    OT Assessment  All further OT needs can be met in the next venue of care    Follow Up Recommendations  Home health OT;Supervision/Assistance - 24 hour    Barriers to Discharge      Equipment Recommendations  None recommended by OT    Recommendations for Other Services    Frequency       Precautions / Restrictions Precautions Precautions: Fall Restrictions Weight Bearing Restrictions: No   Pertinent Vitals/Pain 4/10 L LE, soreness    ADL  Grooming: Performed;Wash/dry hands;Wash/dry face;Min guard Where Assessed - Grooming: Supported standing Upper Body Bathing: Simulated;Supervision/safety;Set up Lower Body Bathing: Simulated;Moderate assistance Upper Body Dressing: Performed;Supervision/safety;Set up Lower Body Dressing: Performed;Moderate assistance Toilet Transfer: Performed;Minimal assistance Toilet Transfer Method: Sit to stand Toilet Transfer Equipment: Grab bars Toileting - Clothing Manipulation and Hygiene: Performed;Minimal assistance Where Assessed - Medical sales representative and Hygiene: Standing Tub/Shower Transfer Method: Not assessed Transfers/Ambulation Related to ADLs: cues for correct hand placement    OT Diagnosis: Generalized weakness;Acute pain  OT Problem List: Decreased strength;Decreased knowledge of use of DME or AE;Decreased activity tolerance;Decreased safety awareness;Impaired balance (sitting and/or standing);Pain OT Treatment Interventions:     OT Goals(Current goals can be found in the care plan section) Acute Rehab OT Goals Patient Stated Goal: To return home  Visit Information  Last OT Received On: 12/01/12 Assistance Needed: +1 History of Present Illness: Tammy Branch is a 79 y.o. female pmh of parkinson's, presents to the ED after a fall.  Has been feeling weak this afternoon as well.  No pain anywhere or specific symptoms other than some L ankle pain earlier.  No injury during fall.  Patient brought into ED by family.  Found to have LLE cellulitis in ED.       Prior Functioning     Home Living Family/patient expects to be discharged to:: Private residence Living Arrangements: Spouse/significant other Available Help at Discharge: Family;Available 24 hours/day Type of Home: House Home Access: Stairs to enter [employer] of Steps: 3 Entrance Stairs-Rails: Right Home Layout: One level Home Equipment: Walker - 4 wheels;Shower seat Prior Function Level of Independence: Independent Communication Communication: No difficulties Dominant Hand: Right         Vision/Perception Vision - History Baseline Vision: Wears glasses only for reading Patient Visual Report: No change from baseline Perception Perception: Within Functional Limits   Cognition  Cognition Arousal/Alertness: Awake/alert Behavior During Therapy: WFL for tasks assessed/performed;Flat affect Overall Cognitive Status: Within Functional Limits for tasks assessed    Extremity/Trunk Assessment  Upper Extremity Assessment Upper  Extremity Assessment: Overall WFL for tasks assessed;Generalized weakness     Mobility Bed Mobility Bed Mobility: Not assessed Details for Bed Mobility Assistance: in chair upon arrival Transfers Sit to Stand: With armrests;From chair/3-in-1;From toilet;4: Min assist Stand to Sit: 4: Min assist;With armrests;To chair/3-in-1 Details for Transfer Assistance: Cues for hand placement and safety     Exercise     Balance Balance Balance Assessed: No   End of Session OT - End of Session Equipment Utilized During Treatment: Gait belt;Rolling walker Activity Tolerance: Patient tolerated treatment well Patient left: in chair;with call bell/phone within reach;with family/visitor present;with nursing/sitter in room  GO     Galen Manila 12/01/2012, 2:16 PM

## 2012-12-01 NOTE — Progress Notes (Signed)
Met with patient, husband and grandson regarding discharge planning for home health. Provided choice for agency, they selected [facility].  Patient has a RW at home.  [facility]/Mary called with referral for home health RN, PT

## 2012-12-02 DIAGNOSIS — R269 Unspecified abnormalities of gait and mobility: Secondary | ICD-10-CM | POA: Diagnosis not present

## 2012-12-02 DIAGNOSIS — Z9181 History of falling: Secondary | ICD-10-CM | POA: Diagnosis not present

## 2012-12-02 DIAGNOSIS — I10 Essential (primary) hypertension: Secondary | ICD-10-CM | POA: Diagnosis not present

## 2012-12-02 DIAGNOSIS — L02419 Cutaneous abscess of limb, unspecified: Secondary | ICD-10-CM | POA: Diagnosis not present

## 2012-12-02 DIAGNOSIS — R5381 Other malaise: Secondary | ICD-10-CM | POA: Diagnosis not present

## 2012-12-02 DIAGNOSIS — G20 Parkinson's disease: Secondary | ICD-10-CM | POA: Diagnosis not present

## 2012-12-09 ENCOUNTER — Encounter: Payer: Self-pay | Admitting: Family Medicine

## 2012-12-09 ENCOUNTER — Ambulatory Visit (INDEPENDENT_AMBULATORY_CARE_PROVIDER_SITE_OTHER): Payer: Medicare Other | Admitting: Family Medicine

## 2012-12-09 VITALS — BP 160/80 | Temp 98.1°F | Wt 146.0 lb

## 2012-12-09 DIAGNOSIS — G20 Parkinson's disease: Secondary | ICD-10-CM | POA: Diagnosis not present

## 2012-12-09 DIAGNOSIS — E876 Hypokalemia: Secondary | ICD-10-CM

## 2012-12-09 DIAGNOSIS — I10 Essential (primary) hypertension: Secondary | ICD-10-CM | POA: Diagnosis not present

## 2012-12-09 DIAGNOSIS — L02419 Cutaneous abscess of limb, unspecified: Secondary | ICD-10-CM | POA: Diagnosis not present

## 2012-12-09 DIAGNOSIS — L03116 Cellulitis of left lower limb: Secondary | ICD-10-CM

## 2012-12-09 DIAGNOSIS — Z23 Encounter for immunization: Secondary | ICD-10-CM | POA: Diagnosis not present

## 2012-12-09 DIAGNOSIS — G20A1 Parkinson's disease without dyskinesia, without mention of fluctuations: Secondary | ICD-10-CM

## 2012-12-09 DIAGNOSIS — E039 Hypothyroidism, unspecified: Secondary | ICD-10-CM | POA: Diagnosis not present

## 2012-12-09 DIAGNOSIS — F333 Major depressive disorder, recurrent, severe with psychotic symptoms: Secondary | ICD-10-CM

## 2012-12-09 DIAGNOSIS — R32 Unspecified urinary incontinence: Secondary | ICD-10-CM | POA: Diagnosis not present

## 2012-12-09 LAB — CBC WITH DIFFERENTIAL/PLATELET
Basophils Absolute: 0 10*3/uL (ref 0.0–0.1)
Eosinophils Absolute: 0.1 10*3/uL (ref 0.0–0.7)
Eosinophils Relative: 1.6 % (ref 0.0–5.0)
HCT: 40.8 % (ref 36.0–46.0)
Hemoglobin: 13.7 g/dL (ref 12.0–15.0)
Lymphocytes Relative: 14.6 % (ref 12.0–46.0)
Lymphs Abs: 0.9 10*3/uL (ref 0.7–4.0)
Monocytes Relative: 8 % (ref 3.0–12.0)
Neutro Abs: 4.5 10*3/uL (ref 1.4–7.7)
Neutrophils Relative %: 75.5 % (ref 43.0–77.0)
Platelets: 230 10*3/uL (ref 150.0–400.0)
RDW: 13.9 % (ref 11.5–14.6)
WBC: 5.9 10*3/uL (ref 4.5–10.5)

## 2012-12-09 LAB — POCT URINALYSIS DIPSTICK
Glucose, UA: NEGATIVE
Leukocytes, UA: NEGATIVE
Nitrite, UA: NEGATIVE
Protein, UA: NEGATIVE
Spec Grav, UA: 1.01
Urobilinogen, UA: 0.2

## 2012-12-09 LAB — BASIC METABOLIC PANEL
BUN: 21 mg/dL (ref 6–23)
CO2: 29 mEq/L (ref 19–32)
Calcium: 9.2 mg/dL (ref 8.4–10.5)
Chloride: 103 mEq/L (ref 96–112)
Glucose, Bld: 112 mg/dL — ABNORMAL HIGH (ref 70–99)
Sodium: 138 mEq/L (ref 135–145)

## 2012-12-09 MED ORDER — DONEPEZIL HCL 5 MG PO TABS: 5.0000 mg | ORAL_TABLET | Freq: Every day | ORAL | Status: DC

## 2012-12-09 MED ORDER — LEVOTHYROXINE SODIUM 50 MCG PO TABS: 50.0000 ug | ORAL_TABLET | Freq: Every day | ORAL | Status: DC

## 2012-12-09 NOTE — Progress Notes (Signed)
Chief Complaint  Patient presents with  . Hospitalization Follow-up    HPI:  Hospital follow-up - here with son and husband: -hospitalized 9/20-9/22/14 for LLE cellulitis s/p fall -tx with abx in hospital and discharged on clinda -other issues in hospital included hypokal - advised recheck at f/u appt, PT/OT eval for her parkinson's with home PT/OT set up -she and family report: she has had no falls since coming home, appetite has been good, no fevers, family feels like leg looks a lot better - has chronic bilat LE edema -denies: diarrhea, fevers, malaise  -concerning her parkinson's/dementia, in the past - prior PCP (Dr. Cato Mulligan) and I advised neuro referral which she and family refused, she has some intermittent issues with urinary incontinence which are worse the last few days -family feels dementia is stable and she has husband, son and grandson there to keep close eye on her and they prefer not to see neurology  Other chronic problems: HTN: stable -denies: CP, SOB, increased swelling  Hypothyroid: stable  ROS: See pertinent positives and negatives per HPI.  Past Medical History  Diagnosis Date  . Diverticulosis of colon   . Hyperlipidemia   . Hypertension   . Hypothyroidism   . Parkinson disease     Past Surgical History  Procedure Laterality Date  . Abdominal hysterectomy      Family History  Problem Relation Age of Onset  . Stroke Mother   . Cancer Father     prostate    History   Social History  . Marital Status: Married    Spouse Name: N/A    Number of Children: N/A  . Years of Education: N/A   Social History Main Topics  . Smoking status: Never Smoker   . Smokeless tobacco: Never Used  . Alcohol Use: No  . Drug Use: No  . Sexual Activity: Not Currently   Other Topics Concern  . None   Social History Narrative  . None    Current outpatient prescriptions:clindamycin (CLEOCIN) 300 MG capsule, Take 1 capsule (300 mg total) by mouth every 6 (six)  hours., Disp: 32 capsule, Rfl: 0;  donepezil (ARICEPT) 5 MG tablet, Take 1 tablet (5 mg total) by mouth at bedtime., Disp: 90 tablet, Rfl: 3;  levothyroxine (SYNTHROID, LEVOTHROID) 50 MCG tablet, Take 1 tablet (50 mcg total) by mouth daily before breakfast., Disp: 90 tablet, Rfl: 3 lisinopril (PRINIVIL,ZESTRIL) 40 MG tablet, Take 1 tablet (40 mg total) by mouth daily., Disp: 90 tablet, Rfl: 3  EXAM:  Filed Vitals:   12/09/12 1126  BP: 160/80  Temp: 98.1 F (36.7 C)    Body mass index is 24.3 kg/(m^2).  GENERAL: vitals reviewed and listed above, alert, oriented, appears well hydrated and in no acute distress  HEENT: atraumatic, conjunttiva clear, no obvious abnormalities on inspection of external nose and ears  NECK: no obvious masses on inspection  LUNGS: clear to auscultation bilaterally, no wheezes, rales or rhonchi, good air movement  CV: HRRR, bilar LE edema - worse on L  In ankle area with mild erythema in ankle  MS/NEURO: moves all extremities without noticeable abnormality, gait slow, but fairly stable, mild cofwheel rig   PSYCH: pleasant and cooperative, no obvious depression or anxiety  ASSESSMENT AND PLAN:  Discussed the following assessment and plan:  PARKINSON'S DISEASE - Plan: donepezil (ARICEPT) 5 MG tablet  Hypokalemia - Plan: Basic metabolic panel  Cellulitis of left lower leg - Plan: CBC with Differential  HYPOTHYROIDISM - Plan: TSH, levothyroxine (SYNTHROID,  LEVOTHROID) 50 MCG tablet  HYPERTENSION - Plan: Basic metabolic panel  MAJ DPRSV D/O RECUR EPIS SEV SPEC W/PSYCHOT BHV - Plan: donepezil (ARICEPT) 5 MG tablet  Urinary incontinence - Plan: Culture, Urine, POCT urine qual dipstick blood  -Labs today: bmp, tsh, cbc, udip and culture -family feels leg redness and swelling is much improved - current findings more likely post infectious and related to chonic venous stasis -have advised compression, elevation, lasix - family prefers not to do lasix,  they will help her with elevation and agreed to get compression stockings which we advised in the past -discussed pt dementia/chronic issues at length with family - they feel that given her age they prefer to not see neurologist, continue aricept at current dose, feel she is safe at home, don't wish to try any other medications, refused neurology referral again -she will continue with home PT/OT -will have her follow up in 1-2 weeks to recheck  -chronic meds refilled -Patient advised to return or notify a doctor immediately if symptoms worsen or persist or new concerns arise.  Patient Instructions  -elevate legs for 30 minutes twice daily above waist  -wear compression stockings during the day  -finish course of antibiotic  -We have ordered labs or studies at this visit. It can take up to 1-2 weeks for results and processing. We will contact you with instructions IF your results are abnormal. Normal results will be released to your [facility]. If you have not heard from [place] or can not find your results in [facility] in 2 weeks please contact our office.  -follow up here in 1-2 weeks to recheck leg         Naquan Garman R.

## 2012-12-09 NOTE — Addendum Note (Signed)
Addended by: Azucena Freed on: 12/09/2012 01:18 PM   Modules accepted: Orders

## 2012-12-09 NOTE — Patient Instructions (Addendum)
-  elevate legs for 30 minutes twice daily above waist  -wear compression stockings during the day  -finish course of antibiotic  -We have ordered labs or studies at this visit. It can take up to 1-2 weeks for results and processing. We will contact you with instructions IF your results are abnormal. Normal results will be released to your [facility] If you have not heard from [place] or can not find your results in [facility] in 2 weeks please contact our office.  -follow up here in 1-2 weeks to recheck leg

## 2012-12-10 DIAGNOSIS — G20 Parkinson's disease: Secondary | ICD-10-CM

## 2012-12-10 DIAGNOSIS — R5383 Other fatigue: Secondary | ICD-10-CM

## 2012-12-10 DIAGNOSIS — L03119 Cellulitis of unspecified part of limb: Secondary | ICD-10-CM

## 2012-12-10 DIAGNOSIS — L02419 Cutaneous abscess of limb, unspecified: Secondary | ICD-10-CM | POA: Diagnosis not present

## 2012-12-10 DIAGNOSIS — R5381 Other malaise: Secondary | ICD-10-CM

## 2012-12-10 DIAGNOSIS — I10 Essential (primary) hypertension: Secondary | ICD-10-CM | POA: Diagnosis not present

## 2012-12-12 LAB — URINE CULTURE: Colony Count: >=100000

## 2012-12-16 MED ORDER — SULFAMETHOXAZOLE-TMP DS 800-160 MG PO TABS: 1.0000 | ORAL_TABLET | Freq: Two times a day (BID) | ORAL | Status: DC

## 2012-12-16 NOTE — Addendum Note (Signed)
Addended by: Azucena Freed on: 12/16/2012 02:39 PM   Modules accepted: Orders

## 2012-12-16 NOTE — Progress Notes (Signed)
Quick Note:  Attempted to call pt; mediation sent to pharmacy. ______

## 2012-12-17 ENCOUNTER — Telehealth: Payer: Self-pay | Admitting: Family Medicine

## 2012-12-17 NOTE — Telephone Encounter (Signed)
RN called to report that the pt's BP was 162/78 today. Please advise.

## 2012-12-18 NOTE — Telephone Encounter (Signed)
Spoke with RN and patient does not complain of chest pain.  Her normal blood pressure readings are 140/70-80.  Patient has a follow up visit in 2 weeks for blood pressure.  Please advise.

## 2012-12-18 NOTE — Telephone Encounter (Signed)
Noted by Dr Todd 

## 2012-12-18 NOTE — Progress Notes (Signed)
Quick Note:  Attempted to call pt; no answer. ______ 

## 2012-12-23 ENCOUNTER — Encounter: Payer: Self-pay | Admitting: Family

## 2012-12-23 ENCOUNTER — Ambulatory Visit (INDEPENDENT_AMBULATORY_CARE_PROVIDER_SITE_OTHER): Payer: Medicare Other | Admitting: Family

## 2012-12-23 VITALS — BP 140/70 | HR 56 | Wt 143.0 lb

## 2012-12-23 DIAGNOSIS — F0390 Unspecified dementia without behavioral disturbance: Secondary | ICD-10-CM | POA: Diagnosis not present

## 2012-12-23 DIAGNOSIS — L02419 Cutaneous abscess of limb, unspecified: Secondary | ICD-10-CM | POA: Diagnosis not present

## 2012-12-23 DIAGNOSIS — L03116 Cellulitis of left lower limb: Secondary | ICD-10-CM

## 2012-12-23 DIAGNOSIS — I10 Essential (primary) hypertension: Secondary | ICD-10-CM | POA: Diagnosis not present

## 2012-12-23 NOTE — Progress Notes (Signed)
Subjective:    Patient ID: Tammy Branch, female    DOB: 1921/06/17, 79 y.o.   MRN: [id number]  HPI  79 year old white female, patient of Dr. Selena Batten in today for recheck of elevated blood pressure and left leg cellulitis. She reports is still much better. Blood pressure is under better control. She's been wearing compression stockings bilaterally that is helped with swelling and cellulitis is much more improved. She is spending more time with her legs up in a recliner. Tolerating medications well.  Review of Systems  Constitutional: Negative.   HENT: Negative.   Respiratory: Negative.   Cardiovascular: Negative.   Gastrointestinal: Negative.   Endocrine: Negative.   Genitourinary: Negative.   Musculoskeletal: Negative.   Skin: Negative.   Neurological: Negative.   Psychiatric/Behavioral: Negative.    Past Medical History  Diagnosis Date  . Diverticulosis of colon   . Hyperlipidemia   . Hypertension   . Hypothyroidism   . Parkinson disease     History   Social History  . Marital Status: Married    Spouse Name: N/A    Number of Children: N/A  . Years of Education: N/A   Occupational History  . Not on file.   Social History Main Topics  . Smoking status: Never Smoker   . Smokeless tobacco: Never Used  . Alcohol Use: No  . Drug Use: No  . Sexual Activity: Not Currently   Other Topics Concern  . Not on file   Social History Narrative  . No narrative on file    Past Surgical History  Procedure Laterality Date  . Abdominal hysterectomy      Family History  Problem Relation Age of Onset  . Stroke Mother   . Cancer Father     prostate    Allergies  Allergen Reactions  . Citalopram Hydrobromide     REACTION: hallucinations    Current Outpatient Prescriptions on File Prior to Visit  Medication Sig Dispense Refill  . donepezil (ARICEPT) 5 MG tablet Take 1 tablet (5 mg total) by mouth at bedtime.  90 tablet  3  . levothyroxine (SYNTHROID, LEVOTHROID) 50  MCG tablet Take 1 tablet (50 mcg total) by mouth daily before breakfast.  90 tablet  3  . lisinopril (PRINIVIL,ZESTRIL) 40 MG tablet Take 1 tablet (40 mg total) by mouth daily.  90 tablet  3  . clindamycin (CLEOCIN) 300 MG capsule Take 1 capsule (300 mg total) by mouth every 6 (six) hours.  32 capsule  0  . sulfamethoxazole-trimethoprim (BACTRIM DS) 800-160 MG per tablet Take 1 tablet by mouth 2 (two) times daily.  42 tablet  0   No current facility-administered medications on file prior to visit.    BP 140/70  Pulse 56  Wt 143 lb (64.864 kg)  BMI 23.8 kg/m2chart    Objective:   Physical Exam  Constitutional: She appears well-developed and well-nourished.  HENT:  Right Ear: External ear normal.  Left Ear: External ear normal.  Nose: Nose normal.  Mouth/Throat: Oropharynx is clear and moist.  Neck: Normal range of motion. Neck supple.  Cardiovascular: Normal rate, regular rhythm and normal heart sounds.   Pulmonary/Chest: Effort normal and breath sounds normal.  Abdominal: Soft. Bowel sounds are normal.  Musculoskeletal: Normal range of motion. She exhibits no edema.  Neurological: She has normal reflexes.  Skin: Skin is warm and dry. No rash noted. No erythema.  Psychiatric: She has a normal mood and affect.  Assessment & Plan:  Assessment: 1. Cellulitis left lower leg 2. Hypertension-better control 3. Dementia  Plan: Continue current medications. Patient to call the office with any questions or concerns. Recheck per Dr. Selena Batten in 3 months and sooner as needed.

## 2013-01-07 NOTE — Progress Notes (Signed)
Quick Note:  Attempted to call pt; no answer. ______

## 2013-03-11 NOTE — Progress Notes (Signed)
OT Eval Addendum Late Entry for G codes

## 2013-03-16 NOTE — Progress Notes (Signed)
Physical Therapy Note (Late entry for missed G-Code)   12-07-12 1705  PT G-Codes **NOT FOR INPATIENT CLASS**  Functional Assessment Tool Used CLinical Judgement  Functional Limitation Mobility: Walking and moving around  Mobility: Walking and Moving Around Current Status [phone]) CJ  Mobility: Walking and Moving Around Goal Status [phone]) [facility] [place], [place] [phone]

## 2013-03-25 ENCOUNTER — Ambulatory Visit (INDEPENDENT_AMBULATORY_CARE_PROVIDER_SITE_OTHER): Payer: Medicare Other | Admitting: Family Medicine

## 2013-03-25 ENCOUNTER — Ambulatory Visit: Payer: 59 | Admitting: Family

## 2013-03-25 ENCOUNTER — Encounter: Payer: Self-pay | Admitting: Family Medicine

## 2013-03-25 VITALS — BP 130/70 | Temp 98.0°F | Wt 141.0 lb

## 2013-03-25 DIAGNOSIS — E039 Hypothyroidism, unspecified: Secondary | ICD-10-CM

## 2013-03-25 DIAGNOSIS — I10 Essential (primary) hypertension: Secondary | ICD-10-CM

## 2013-03-25 DIAGNOSIS — G20 Parkinson's disease: Secondary | ICD-10-CM | POA: Diagnosis not present

## 2013-03-25 LAB — BASIC METABOLIC PANEL
BUN: 22 mg/dL (ref 6–23)
CHLORIDE: 105 meq/L (ref 96–112)
CO2: 30 meq/L (ref 19–32)
Calcium: 9.7 mg/dL (ref 8.4–10.5)
Creatinine, Ser: 1.1 mg/dL (ref 0.4–1.2)
GFR: 47.64 mL/min — ABNORMAL LOW (ref >60.00)
Glucose, Bld: 68 mg/dL — ABNORMAL LOW (ref 70–99)
POTASSIUM: 3.9 meq/L (ref 3.5–5.1)
Sodium: 141 mEq/L (ref 135–145)

## 2013-03-25 LAB — TSH: TSH: 3.23 u[IU]/mL (ref 0.35–5.50)

## 2013-03-25 LAB — T4, FREE: Free T4: 1.13 ng/dL (ref 0.60–1.60)

## 2013-03-25 NOTE — Progress Notes (Signed)
Pre visit review using our clinic review tool, if applicable. No additional management support is needed unless otherwise documented below in the visit note. 

## 2013-03-25 NOTE — Progress Notes (Signed)
Chief Complaint  Patient presents with  . 3 month follow up    HPI:  Follow up:  HTN/HLD: -stable  Hypothyroidism: -stable  Parkinson/Dementia: Offered tx/neuro referral numerous times - family not interested and prefer care at home  Venous stasis dermatitis: -improved, wearing compression stockings and elevating  ROS: See pertinent positives and negatives per HPI.  Past Medical History  Diagnosis Date  . Diverticulosis of colon   . Hyperlipidemia   . Hypertension   . Hypothyroidism   . Parkinson disease     Past Surgical History  Procedure Laterality Date  . Abdominal hysterectomy      Family History  Problem Relation Age of Onset  . Stroke Mother   . Cancer Father     prostate    History   Social History  . Marital Status: Married    Spouse Name: N/A    Number of Children: N/A  . Years of Education: N/A   Social History Main Topics  . Smoking status: Never Smoker   . Smokeless tobacco: Never Used  . Alcohol Use: No  . Drug Use: No  . Sexual Activity: Not Currently   Other Topics Concern  . None   Social History Narrative  . None    Current outpatient prescriptions:donepezil (ARICEPT) 5 MG tablet, Take 1 tablet (5 mg total) by mouth at bedtime., Disp: 90 tablet, Rfl: 3;  levothyroxine (SYNTHROID, LEVOTHROID) 50 MCG tablet, Take 1 tablet (50 mcg total) by mouth daily before breakfast., Disp: 90 tablet, Rfl: 3;  lisinopril (PRINIVIL,ZESTRIL) 40 MG tablet, Take 1 tablet (40 mg total) by mouth daily., Disp: 90 tablet, Rfl: 3  EXAM:  Filed Vitals:   03/25/13 1121  BP: 130/70  Temp: 98 F (36.7 C)    Body mass index is 23.46 kg/(m^2).  GENERAL: vitals reviewed and listed above, alert, oriented, appears well hydrated and in no acute distress  HEENT: atraumatic, conjunttiva clear, no obvious abnormalities on inspection of external nose and ears  NECK: no obvious masses on inspection  LUNGS: clear to auscultation bilaterally, no wheezes,  rales or rhonchi, good air movement  CV: HRRR, no peripheral edema  MS: moves all extremities without noticeable abnormality  PSYCH: pleasant and cooperative, no obvious depression or anxiety  ASSESSMENT AND PLAN:  Discussed the following assessment and plan:  HYPOTHYROIDISM - Plan: TSH, T4, Free  PARKINSON'S DISEASE  HYPERTENSION - Plan: Basic metabolic panel  -NON-FASTING labs today -legs looking good -follow up in 4-6 months -Patient advised to return or notify a doctor immediately if symptoms worsen or persist or new concerns arise.  There are no Patient Instructions on file for this visit.   Kriste Basque R.

## 2013-08-12 ENCOUNTER — Other Ambulatory Visit: Payer: Self-pay | Admitting: Family Medicine

## 2013-08-25 ENCOUNTER — Ambulatory Visit: Payer: Medicare Other | Admitting: Family Medicine

## 2013-09-03 ENCOUNTER — Encounter: Payer: Self-pay | Admitting: Family Medicine

## 2013-09-03 ENCOUNTER — Ambulatory Visit (INDEPENDENT_AMBULATORY_CARE_PROVIDER_SITE_OTHER): Payer: Medicare Other | Admitting: Family Medicine

## 2013-09-03 VITALS — BP 130/78 | HR 50 | Temp 97.9°F | Wt 140.0 lb

## 2013-09-03 DIAGNOSIS — F333 Major depressive disorder, recurrent, severe with psychotic symptoms: Secondary | ICD-10-CM

## 2013-09-03 DIAGNOSIS — E039 Hypothyroidism, unspecified: Secondary | ICD-10-CM | POA: Diagnosis not present

## 2013-09-03 DIAGNOSIS — E785 Hyperlipidemia, unspecified: Secondary | ICD-10-CM | POA: Diagnosis not present

## 2013-09-03 DIAGNOSIS — I10 Essential (primary) hypertension: Secondary | ICD-10-CM

## 2013-09-03 DIAGNOSIS — G20 Parkinson's disease: Secondary | ICD-10-CM

## 2013-09-03 DIAGNOSIS — G20A1 Parkinson's disease without dyskinesia, without mention of fluctuations: Secondary | ICD-10-CM

## 2013-09-03 MED ORDER — LEVOTHYROXINE SODIUM 50 MCG PO TABS: 50.0000 ug | ORAL_TABLET | Freq: Every day | ORAL | Status: DC

## 2013-09-03 MED ORDER — DONEPEZIL HCL 5 MG PO TABS: 5.0000 mg | ORAL_TABLET | Freq: Every day | ORAL | Status: DC

## 2013-09-03 MED ORDER — LISINOPRIL 40 MG PO TABS: ORAL_TABLET | ORAL | Status: DC

## 2013-09-03 NOTE — Progress Notes (Signed)
No chief complaint on file.   HPI:  Follow up:  HTN/HLD: -stable  Hypothyroidism: -stable  Parkinson/Dementia: Offered tx/neuro referral numerous times - family not interested and prefer care at home  Venous stasis dermatitis: -improved, wearing compression stockings and elevating  Chronic Urinary incontinence: -not worsening, urge  -no hematuira, abd pain, vag symptoms -son wishes she would wear the depends  ROS: See pertinent positives and negatives per HPI.  Past Medical History  Diagnosis Date  . Diverticulosis of colon   . Hyperlipidemia   . Hypertension   . Hypothyroidism   . Parkinson disease     Past Surgical History  Procedure Laterality Date  . Abdominal hysterectomy      Family History  Problem Relation Age of Onset  . Stroke Mother   . Cancer Father     prostate    History   Social History  . Marital Status: Married    Spouse Name: N/A    Number of Children: N/A  . Years of Education: N/A   Social History Main Topics  . Smoking status: Never Smoker   . Smokeless tobacco: Never Used  . Alcohol Use: No  . Drug Use: No  . Sexual Activity: Not Currently   Other Topics Concern  . None   Social History Narrative  . None    Current outpatient prescriptions:donepezil (ARICEPT) 5 MG tablet, Take 1 tablet (5 mg total) by mouth at bedtime., Disp: 90 tablet, Rfl: 3;  levothyroxine (SYNTHROID, LEVOTHROID) 50 MCG tablet, Take 1 tablet (50 mcg total) by mouth daily before breakfast., Disp: 90 tablet, Rfl: 3;  lisinopril (PRINIVIL,ZESTRIL) 40 MG tablet, TAKE 1 TABLET (40 MG TOTAL) BY MOUTH DAILY., Disp: 90 tablet, Rfl: 0  EXAM:  Filed Vitals:   09/03/13 1326  BP: 130/78  Pulse: 50  Temp: 97.9 F (36.6 C)    Body mass index is 23.3 kg/(m^2).  GENERAL: vitals reviewed and listed above, alert, oriented, appears well hydrated and in no acute distress  HEENT: atraumatic, conjunttiva clear, no obvious abnormalities on inspection of external  nose and ears  NECK: no obvious masses on inspection  LUNGS: clear to auscultation bilaterally, no wheezes, rales or rhonchi, good air movement  CV: HRRR, no peripheral edema  MS: moves all extremities without noticeable abnormality  PSYCH: pleasant and cooperative, no obvious depression or anxiety  ASSESSMENT AND PLAN:  Discussed the following assessment and plan:  PARKINSON'S DISEASE  HYPOTHYROIDISM  HYPERLIPIDEMIA  HYPERTENSION  -labs next visit -continue current medications -discussed options for urinary incontinence and they prefer to stick with timed voiding and depends, will call if want to try medication for urge incontinence -follow up in 4-6 months -Patient advised to return or notify a doctor immediately if symptoms worsen or persist or new concerns arise.  There are no Patient Instructions on file for this visit.   Kriste Basque R.

## 2013-09-04 ENCOUNTER — Telehealth: Payer: Self-pay | Admitting: Family Medicine

## 2013-09-04 NOTE — Telephone Encounter (Signed)
Relevant patient education assigned to patient using Emmi. ° °

## 2013-11-28 ENCOUNTER — Other Ambulatory Visit: Payer: Self-pay | Admitting: Family Medicine

## 2014-02-25 ENCOUNTER — Encounter: Payer: Self-pay | Admitting: Family Medicine

## 2014-02-25 ENCOUNTER — Ambulatory Visit (INDEPENDENT_AMBULATORY_CARE_PROVIDER_SITE_OTHER): Payer: Medicare Other | Admitting: Family Medicine

## 2014-02-25 VITALS — BP 150/62 | HR 59 | Temp 97.5°F | Ht 65.0 in | Wt 142.1 lb

## 2014-02-25 DIAGNOSIS — G20 Parkinson's disease: Secondary | ICD-10-CM | POA: Diagnosis not present

## 2014-02-25 DIAGNOSIS — I10 Essential (primary) hypertension: Secondary | ICD-10-CM

## 2014-02-25 DIAGNOSIS — E038 Other specified hypothyroidism: Secondary | ICD-10-CM

## 2014-02-25 DIAGNOSIS — Z23 Encounter for immunization: Secondary | ICD-10-CM

## 2014-02-25 LAB — BASIC METABOLIC PANEL
BUN: 23 mg/dL (ref 6–23)
CO2: 27 mEq/L (ref 19–32)
CREATININE: 1.1 mg/dL (ref 0.4–1.2)
Calcium: 9.4 mg/dL (ref 8.4–10.5)
Chloride: 106 mEq/L (ref 96–112)
GFR: 50.6 mL/min — ABNORMAL LOW (ref >60.00)
GLUCOSE: 95 mg/dL (ref 70–99)
Potassium: 4.1 mEq/L (ref 3.5–5.1)
Sodium: 140 mEq/L (ref 135–145)

## 2014-02-25 LAB — TSH: TSH: 3.66 u[IU]/mL (ref 0.35–4.50)

## 2014-02-25 NOTE — Progress Notes (Signed)
HPI:  HTN/HLD: -meds: lisinopril 40 -stable -denies: CP, SOB, DOE, swelling  Hypothyroidism: -meds: levothyroxine 50mcg daily -stable  Parkinson/Dementia: Offered tx/neuro referral numerous times - family not interested and prefer care at home On aricept 5mg  daily and stable -husband and son come to all appointments and seem and report happy to care for her well at home  Venous stasis dermatitis: -improved, wearing compression stockings and elevating -no increased swelling, pain or erythema  Chronic Urinary incontinence: -not worsening, urge  -no hematuira, abd pain, vag symptoms -son wishes she would wear the depends   ROS: See pertinent positives and negatives per HPI.  Past Medical History  Diagnosis Date  . Diverticulosis of colon   . Hyperlipidemia   . Hypertension   . Hypothyroidism   . Parkinson disease     Past Surgical History  Procedure Laterality Date  . Abdominal hysterectomy      Family History  Problem Relation Age of Onset  . Stroke Mother   . Cancer Father     prostate    History   Social History  . Marital Status: Married    Spouse Name: N/A    Number of Children: N/A  . Years of Education: N/A   Social History Main Topics  . Smoking status: Never Smoker   . Smokeless tobacco: Never Used  . Alcohol Use: No  . Drug Use: No  . Sexual Activity: Not Currently   Other Topics Concern  . None   Social History Narrative    Current outpatient prescriptions: donepezil (ARICEPT) 5 MG tablet, Take 1 tablet (5 mg total) by mouth at bedtime., Disp: 90 tablet, Rfl: 3;  levothyroxine (SYNTHROID, LEVOTHROID) 50 MCG tablet, Take 1 tablet (50 mcg total) by mouth daily before breakfast., Disp: 90 tablet, Rfl: 3;  lisinopril (PRINIVIL,ZESTRIL) 40 MG tablet, TAKE 1 TABLET (40 MG TOTAL) BY MOUTH DAILY., Disp: 90 tablet, Rfl: 3  EXAM:  Filed Vitals:   02/25/14 1313  BP: 150/62  Pulse: 59  Temp: 97.5 F (36.4 C)    Body mass index is 23.65  kg/(m^2).  GENERAL: vitals reviewed and listed above, alert, oriented, appears well hydrated and in no acute distress  HEENT: atraumatic, conjunttiva clear, no obvious abnormalities on inspection of external nose and ears  NECK: no obvious masses on inspection  LUNGS: clear to auscultation bilaterally, no wheezes, rales or rhonchi, good air movement  CV: HRRR, no peripheral edema  MS: moves all extremities without noticeable abnormality  PSYCH: pleasant and cooperative, no obvious depression or anxiety  ASSESSMENT AND PLAN:  Discussed the following assessment and plan:  Other specified hypothyroidism - Plan: TSH  Essential hypertension - Plan: Basic metabolic panel  PARKINSON'S DISEASE  -stable -she is not fasting and son reports she can not come fasting now or in the future as wants to eat when gets up - wants to stop check lipids after discussion risks/benefits -check non-fasting labs today -follow up 6 months -Patient advised to return or notify a doctor immediately if symptoms worsen or persist or new concerns arise.  Patient Instructions  BEFORE YOU LEAVE: -labs -flu vaccine -follow up Medicare wellness exam in 6 months  -We have ordered labs or studies at this visit. It can take up to 1-2 weeks for results and processing. We will contact you with instructions IF your results are abnormal. Normal results will be [facility]. If you have not heard from us or can not find your [facility] in [facility]  weeks please contact our office.            Kriste Basque R.

## 2014-02-25 NOTE — Progress Notes (Signed)
Pre visit review using our clinic review tool, if applicable. No additional management support is needed unless otherwise documented below in the visit note. 

## 2014-02-25 NOTE — Patient Instructions (Addendum)
BEFORE YOU LEAVE: -labs -flu vaccine -follow up Medicare wellness exam in 6 months  -We have ordered labs or studies at this visit. It can take up to 1-2 weeks for results and processing. We will contact you with instructions IF your results are abnormal. Normal results will be released to your [facility]. If you have not heard from [place] or can not find your results in [facility] in 2 weeks please contact our office.

## 2014-02-25 NOTE — Addendum Note (Signed)
Addended by: Johnella Moloney on: 02/25/2014 02:13 PM   Modules accepted: Orders

## 2014-03-08 ENCOUNTER — Other Ambulatory Visit: Payer: Self-pay | Admitting: Family Medicine

## 2014-05-18 ENCOUNTER — Emergency Department (HOSPITAL_COMMUNITY): Payer: Medicare Other

## 2014-05-18 ENCOUNTER — Encounter (HOSPITAL_COMMUNITY): Payer: Self-pay

## 2014-05-18 ENCOUNTER — Emergency Department (HOSPITAL_COMMUNITY)
Admission: EM | Admit: 2014-05-18 | Discharge: 2014-05-18 | Disposition: A | Discharge status: Home / Self Care | Payer: Medicare Other | Attending: Emergency Medicine | Admitting: Emergency Medicine

## 2014-05-18 DIAGNOSIS — I10 Essential (primary) hypertension: Secondary | ICD-10-CM | POA: Insufficient documentation

## 2014-05-18 DIAGNOSIS — S0990XA Unspecified injury of head, initial encounter: Secondary | ICD-10-CM

## 2014-05-18 DIAGNOSIS — Y998 Other external cause status: Secondary | ICD-10-CM | POA: Diagnosis not present

## 2014-05-18 DIAGNOSIS — G20 Parkinson's disease: Secondary | ICD-10-CM | POA: Insufficient documentation

## 2014-05-18 DIAGNOSIS — Y9389 Activity, other specified: Secondary | ICD-10-CM | POA: Insufficient documentation

## 2014-05-18 DIAGNOSIS — W01198A Fall on same level from slipping, tripping and stumbling with subsequent striking against other object, initial encounter: Secondary | ICD-10-CM | POA: Insufficient documentation

## 2014-05-18 DIAGNOSIS — S199XXA Unspecified injury of neck, initial encounter: Secondary | ICD-10-CM | POA: Diagnosis not present

## 2014-05-18 DIAGNOSIS — E039 Hypothyroidism, unspecified: Secondary | ICD-10-CM | POA: Diagnosis not present

## 2014-05-18 DIAGNOSIS — Z8719 Personal history of other diseases of the digestive system: Secondary | ICD-10-CM | POA: Diagnosis not present

## 2014-05-18 DIAGNOSIS — Z79899 Other long term (current) drug therapy: Secondary | ICD-10-CM | POA: Insufficient documentation

## 2014-05-18 DIAGNOSIS — Y92091 Bathroom in other non-institutional residence as the place of occurrence of the external cause: Secondary | ICD-10-CM | POA: Diagnosis not present

## 2014-05-18 DIAGNOSIS — S50812A Abrasion of left forearm, initial encounter: Secondary | ICD-10-CM | POA: Insufficient documentation

## 2014-05-18 DIAGNOSIS — S0101XA Laceration without foreign body of scalp, initial encounter: Secondary | ICD-10-CM | POA: Diagnosis not present

## 2014-05-18 DIAGNOSIS — Z23 Encounter for immunization: Secondary | ICD-10-CM | POA: Diagnosis not present

## 2014-05-18 DIAGNOSIS — S40812A Abrasion of left upper arm, initial encounter: Secondary | ICD-10-CM

## 2014-05-18 DIAGNOSIS — R51 Headache: Secondary | ICD-10-CM | POA: Diagnosis not present

## 2014-05-18 LAB — BASIC METABOLIC PANEL
Anion gap: 6 (ref 5–15)
BUN: 20 mg/dL (ref 6–23)
CO2: 27 mmol/L (ref 19–32)
Calcium: 8.5 mg/dL (ref 8.4–10.5)
Chloride: 108 mmol/L (ref 96–112)
Creatinine, Ser: 0.93 mg/dL (ref 0.50–1.10)
GFR calc non Af Amer: 53 mL/min — ABNORMAL LOW (ref >90)
GFR, EST AFRICAN AMERICAN: 61 mL/min — AB (ref >90)
Glucose, Bld: 106 mg/dL — ABNORMAL HIGH (ref 70–99)
Potassium: 4 mmol/L (ref 3.5–5.1)
SODIUM: 141 mmol/L (ref 135–145)

## 2014-05-18 LAB — URINALYSIS, ROUTINE W REFLEX MICROSCOPIC
Bilirubin Urine: NEGATIVE
Glucose, UA: NEGATIVE mg/dL
Hgb urine dipstick: NEGATIVE
Ketones, ur: NEGATIVE mg/dL
Leukocytes, UA: NEGATIVE
Nitrite: NEGATIVE
Protein, ur: NEGATIVE mg/dL
Specific Gravity, Urine: 1.009 (ref 1.005–1.030)
Urobilinogen, UA: 1 mg/dL (ref 0.0–1.0)
pH: 7.5 (ref 5.0–8.0)

## 2014-05-18 LAB — CBC WITH DIFFERENTIAL/PLATELET
BASOS PCT: 0 % (ref 0–1)
Basophils Absolute: 0 10*3/uL (ref 0.0–0.1)
EOS PCT: 0 % (ref 0–5)
Eosinophils Absolute: 0 10*3/uL (ref 0.0–0.7)
HEMATOCRIT: 37.6 % (ref 36.0–46.0)
Hemoglobin: 11.9 g/dL — ABNORMAL LOW (ref 12.0–15.0)
LYMPHS ABS: 0.7 10*3/uL (ref 0.7–4.0)
Lymphocytes Relative: 8 % — ABNORMAL LOW (ref 12–46)
MCH: 29.5 pg (ref 26.0–34.0)
MCHC: 31.6 g/dL (ref 30.0–36.0)
MCV: 93.3 fL (ref 78.0–100.0)
Monocytes Absolute: 0.7 10*3/uL (ref 0.1–1.0)
Monocytes Relative: 8 % (ref 3–12)
NEUTROS ABS: 7.3 10*3/uL (ref 1.7–7.7)
Neutrophils Relative %: 84 % — ABNORMAL HIGH (ref 43–77)
Platelets: 238 10*3/uL (ref 150–400)
RBC: 4.03 MIL/uL (ref 3.87–5.11)
RDW: 13.6 % (ref 11.5–15.5)
WBC: 8.7 10*3/uL (ref 4.0–10.5)

## 2014-05-18 MED ORDER — BACITRACIN ZINC 500 UNIT/GM EX OINT
TOPICAL_OINTMENT | CUTANEOUS | Status: AC
  Filled 2014-05-18: qty 0.9

## 2014-05-18 MED ORDER — TETANUS-DIPHTH-ACELL PERTUSSIS 5-2.5-18.5 LF-MCG/0.5 IM SUSP
0.5000 mL | Freq: Once | INTRAMUSCULAR | Status: AC
  Administered 2014-05-18: 0.5 mL via INTRAMUSCULAR
  Filled 2014-05-18: qty 0.5

## 2014-05-18 NOTE — ED Notes (Signed)
Patient arrived via EMS from home after she fell while ambulating to the bathroom.  Laceration to back of head, skin tear to left forearm, and right hip pain.  Denies LOC, dizziness, vomiting, visual disturbance, etc.  No deformity noted.  Spinal immobilization not indicated.

## 2014-05-18 NOTE — Discharge Instructions (Signed)
Head Injury °You have received a head injury. It does not appear serious at this time. Headaches and vomiting are common following head injury. It should be easy to awaken from sleeping. Sometimes it is necessary for you to stay in the emergency department for a while for observation. Sometimes admission to the hospital may be needed. After injuries such as yours, most problems occur within the first 24 hours, but side effects may occur up to 7-10 days after the injury. It is important for you to carefully monitor your condition and contact your health care provider or seek immediate medical care if there is a change in your condition. °WHAT ARE THE TYPES OF HEAD INJURIES? °Head injuries can be as minor as a bump. Some head injuries can be more severe. More severe head injuries include: °· A jarring injury to the brain (concussion). °· A bruise of the brain (contusion). This mean there is bleeding in the brain that can cause swelling. °· A cracked skull (skull fracture). °· Bleeding in the brain that collects, clots, and forms a bump (hematoma). °WHAT CAUSES A HEAD INJURY? °A serious head injury is most likely to happen to someone who is in a car wreck and is not wearing a seat belt. Other causes of major head injuries include bicycle or motorcycle accidents, sports injuries, and falls. °HOW ARE HEAD INJURIES DIAGNOSED? °A complete history of the event leading to the injury and your current symptoms will be helpful in diagnosing head injuries. Many times, pictures of the brain, such as CT or MRI are needed to see the extent of the injury. Often, an overnight hospital stay is necessary for observation.  °WHEN SHOULD I SEEK IMMEDIATE MEDICAL CARE?  °You should get help right away if: °· You have confusion or drowsiness. °· You feel sick to your stomach (nauseous) or have continued, forceful vomiting. °· You have dizziness or unsteadiness that is getting worse. °· You have severe, continued headaches not relieved by  medicine. Only take over-the-counter or prescription medicines for pain, fever, or discomfort as directed by your health care provider. °· You do not have normal function of the arms or legs or are unable to walk. °· You notice changes in the black spots in the center of the colored part of your eye (pupil). °· You have a clear or bloody fluid coming from your nose or ears. °· You have a loss of vision. °During the next 24 hours after the injury, you must stay with someone who can watch you for the warning signs. This person should contact local emergency services (911 in the [place] if you have seizures, you become unconscious, or you are unable to wake up. °HOW CAN I PREVENT A HEAD INJURY IN THE FUTURE? °The most important factor for preventing major head injuries is avoiding motor vehicle accidents.  To minimize the potential for damage to your head, it is crucial to wear seat belts while riding in motor vehicles. Wearing helmets while bike riding and playing collision sports (like football) is also helpful. Also, avoiding dangerous activities around the house will further help reduce your risk of head injury.  °WHEN CAN I RETURN TO NORMAL ACTIVITIES AND ATHLETICS? °You should be reevaluated by your health care provider before returning to these activities. If you have any of the following symptoms, you should not return to activities or contact sports until 1 week after the symptoms have stopped: °· Persistent headache. °· Dizziness or vertigo. °· Poor attention and concentration. °· Confusion. °·   Memory problems.  Nausea or vomiting.  Fatigue or tire easily.  Irritability.  Intolerant of bright lights or loud noises.  Anxiety or depression.  Disturbed sleep. MAKE SURE YOU:   Understand these instructions.  Will watch your condition.  Will get help right away if you are not doing well or get worse. Document Released: 02/26/2005 Document Revised: 03/03/2013 Document Reviewed:  11/03/2012 [facility] Patient Information 2015 [place], [place]. This information is not intended to replace advice given to you by your health care provider. Make sure you discuss any questions you have with your health care provider.  Laceration Care, Adult A laceration is a cut or lesion that goes through all layers of the skin and into the tissue just beneath the skin. TREATMENT  Some lacerations may not require closure. Some lacerations may not be able to be closed due to an increased risk of infection. It is important to see your caregiver as soon as possible after an injury to minimize the risk of infection and maximize the opportunity for successful closure. If closure is appropriate, pain medicines may be given, if needed. The wound will be cleaned to help prevent infection. Your caregiver will use stitches (sutures), staples, wound glue (adhesive), or skin adhesive strips to repair the laceration. These tools bring the skin edges together to allow for faster healing and a better cosmetic outcome. However, all wounds will heal with a scar. Once the wound has healed, scarring can be minimized by covering the wound with sunscreen during the day for 1 full year. HOME CARE INSTRUCTIONS  For sutures or staples:  Keep the wound clean and dry.  If you were given a bandage (dressing), you should change it at least once a day. Also, change the dressing if it becomes wet or dirty, or as directed by your caregiver.  Wash the wound with soap and water 2 times a day. Rinse the wound off with water to remove all soap. Pat the wound dry with a clean towel.  After cleaning, apply a thin layer of the antibiotic ointment as recommended by your caregiver. This will help prevent infection and keep the dressing from sticking.  You may shower as usual after the first 24 hours. Do not soak the wound in water until the sutures are removed.  Only take over-the-counter or prescription medicines for pain,  discomfort, or fever as directed by your caregiver.  Get your sutures or staples removed as directed by your caregiver. For skin adhesive strips:  Keep the wound clean and dry.  Do not get the skin adhesive strips wet. You may bathe carefully, using caution to keep the wound dry.  If the wound gets wet, pat it dry with a clean towel.  Skin adhesive strips will fall off on their own. You may trim the strips as the wound heals. Do not remove skin adhesive strips that are still stuck to the wound. They will fall off in time. For wound adhesive:  You may briefly wet your wound in the shower or bath. Do not soak or scrub the wound. Do not swim. Avoid periods of heavy perspiration until the skin adhesive has fallen off on its own. After showering or bathing, gently pat the wound dry with a clean towel.  Do not apply liquid medicine, cream medicine, or ointment medicine to your wound while the skin adhesive is in place. This may loosen the film before your wound is healed.  If a dressing is placed over the wound, be careful not  to apply tape directly over the skin adhesive. This may cause the adhesive to be pulled off before the wound is healed. °· Avoid prolonged exposure to sunlight or tanning lamps while the skin adhesive is in place. Exposure to ultraviolet light in the first year will darken the scar. °· The skin adhesive will usually remain in place for 5 to 10 days, then naturally fall off the skin. Do not pick at the adhesive film. °You may need a tetanus shot if: °· You cannot remember when you had your last tetanus shot. °· You have never had a tetanus shot. °If you get a tetanus shot, your arm may swell, get red, and feel warm to the touch. This is common and not a problem. If you need a tetanus shot and you choose not to have one, there is a rare chance of getting tetanus. Sickness from tetanus can be serious. °SEEK MEDICAL CARE IF:  °· You have redness, swelling, or increasing pain in the  wound. °· You see a red line that goes away from the wound. °· You have yellowish-white fluid (pus) coming from the wound. °· You have a fever. °· You notice a bad smell coming from the wound or dressing. °· Your wound breaks open before or after sutures have been removed. °· You notice something coming out of the wound such as wood or glass. °· Your wound is on your hand or foot and you cannot move a finger or toe. °SEEK IMMEDIATE MEDICAL CARE IF:  °· Your pain is not controlled with prescribed medicine. °· You have severe swelling around the wound causing pain and numbness or a change in color in your arm, hand, leg, or foot. °· Your wound splits open and starts bleeding. °· You have worsening numbness, weakness, or loss of function of any joint around or beyond the wound. °· You develop painful lumps near the wound or on the skin anywhere on your body. °MAKE SURE YOU:  °· Understand these instructions. °· Will watch your condition. °· Will get help right away if you are not doing well or get worse. °Document Released: 02/26/2005 Document Revised: 05/21/2011 Document Reviewed: 08/22/2010 °[employer]® Patient Information ©2015 [employer]. This information is not intended to replace advice given to you by your health care provider. Make sure you discuss any questions you have with your health care provider. ° °Stitches, Staples, or Skin Adhesive Strips  °Stitches (sutures), staples, and skin adhesive strips hold the skin together as it heals. They will usually be in place for 7 days or less. °HOME CARE °· Wash your hands with soap and water before and after you touch your wound. °· Only take medicine as told by your doctor. °· Cover your wound only if your doctor told you to. Otherwise, leave it open to air. °· Do not get your stitches wet or dirty. If they get dirty, dab them gently with a clean washcloth. Wet the washcloth with soapy water. Do not rub. Pat them dry gently. °· Do not put medicine or medicated  cream on your stitches unless your doctor told you to. °· Do not take out your own stitches or staples. Skin adhesive strips will fall off by themselves. °· Do not pick at the wound. Picking can cause an infection. °· Do not miss your follow-up appointment. °· If you have problems or questions, call your doctor. °GET HELP RIGHT AWAY IF:  °· You have a temperature by mouth above 102° F (38.9° C), not   controlled by medicine.  You have chills.  You have redness or pain around your stitches.  There is puffiness (swelling) around your stitches.  You notice fluid (drainage) from your stitches.  There is a bad smell coming from your wound. MAKE SURE YOU:  Understand these instructions.  Will watch your condition.  Will get help if you are not doing well or get worse. Document Released: 12/24/2008 Document Revised: 05/21/2011 Document Reviewed: 12/24/2008 [facility] Patient Information 2015 [place], [place]. This information is not intended to replace advice given to you by your health care provider. Make sure you discuss any questions you have with your health care provider.  Abrasion An abrasion is a cut or scrape of the skin. Abrasions do not extend through all layers of the skin and most heal within 10 days. It is important to care for your abrasion properly to prevent infection. CAUSES  Most abrasions are caused by falling on, or gliding across, the ground or other surface. When your skin rubs on something, the outer and inner layer of skin rubs off, causing an abrasion. DIAGNOSIS  Your caregiver will be able to diagnose an abrasion during a physical exam.  TREATMENT  Your treatment depends on how large and deep the abrasion is. Generally, your abrasion will be cleaned with water and a mild soap to remove any dirt or debris. An antibiotic ointment may be put over the abrasion to prevent an infection. A bandage (dressing) may be wrapped around the abrasion to keep it from getting dirty.  You  may need a tetanus shot if:  You cannot remember when you had your last tetanus shot.  You have never had a tetanus shot.  The injury broke your skin. If you get a tetanus shot, your arm may swell, get red, and feel warm to the touch. This is common and not a problem. If you need a tetanus shot and you choose not to have one, there is a rare chance of getting tetanus. Sickness from tetanus can be serious.  HOME CARE INSTRUCTIONS   If a dressing was applied, change it at least once a day or as directed by your caregiver. If the bandage sticks, soak it off with warm water.   Wash the area with water and a mild soap to remove all the ointment 2 times a day. Rinse off the soap and pat the area dry with a clean towel.   Reapply any ointment as directed by your caregiver. This will help prevent infection and keep the bandage from sticking. Use gauze over the wound and under the dressing to help keep the bandage from sticking.   Change your dressing right away if it becomes wet or dirty.   Only take over-the-counter or prescription medicines for pain, discomfort, or fever as directed by your caregiver.   Follow up with your caregiver within 24-48 hours for a wound check, or as directed. If you were not given a wound-check appointment, look closely at your abrasion for redness, swelling, or pus. These are signs of infection. SEEK IMMEDIATE MEDICAL CARE IF:   You have increasing pain in the wound.   You have redness, swelling, or tenderness around the wound.   You have pus coming from the wound.   You have a fever or persistent symptoms for more than 2-3 days.  You have a fever and your symptoms suddenly get worse.  You have a bad smell coming from the wound or dressing.  MAKE SURE YOU:   Understand  these instructions.  Will watch your condition.  Will get help right away if you are not doing well or get worse. Document Released: 12/06/2004 Document Revised: 02/13/2012  Document Reviewed: 01/30/2011 [facility] Patient Information 2015 [place], [place]. This information is not intended to replace advice given to you by your health care provider. Make sure you discuss any questions you have with your health care provider.

## 2014-05-18 NOTE — ED Provider Notes (Signed)
CSN: [id number]     Arrival date & time 05/18/14  0622 History   First MD Initiated Contact with Patient 05/18/14 0730     Chief Complaint  Patient presents with  . Fall     (Consider location/radiation/quality/duration/timing/severity/associated sxs/prior Treatment) HPI   Tammy Branch is a 81 y.o. female who was walking in her bathroom, when she fell, striking the back of her head on something. She did not lose consciousness. Her husband found her, and try to help her up, but was unable to. EMS was called and transferred her here, without immobilization. The patient is alert and able to give some history. However, it is incomplete. She has Parkinson's and has poor gait secondary to that. Her son-in-law is with her and gives the majority of the information.  Level V Caveat- poor historian, dementia, suspected   Past Medical History  Diagnosis Date  . Diverticulosis of colon   . Hyperlipidemia   . Hypertension   . Hypothyroidism   . Parkinson disease    Past Surgical History  Procedure Laterality Date  . Abdominal hysterectomy     Family History  Problem Relation Age of Onset  . Stroke Mother   . Cancer Father     prostate   History  Substance Use Topics  . Smoking status: Never Smoker   . Smokeless tobacco: Never Used  . Alcohol Use: No   OB History    No data available     Review of Systems  Unable to perform ROS     Allergies  Citalopram hydrobromide  Home Medications   Prior to Admission medications   Medication Sig Start Date End Date Taking? Authorizing Provider  donepezil (ARICEPT) 5 MG tablet Take 1 tablet (5 mg total) by mouth at bedtime. 09/03/13  Yes [name], DO  levothyroxine (SYNTHROID, LEVOTHROID) 50 MCG tablet Take 1 tablet (50 mcg total) by mouth daily before breakfast. 09/03/13  Yes [name], DO  lisinopril (PRINIVIL,ZESTRIL) 40 MG tablet TAKE 1 TABLET (40 MG TOTAL) BY MOUTH DAILY. 09/03/13  Yes [name], DO  levothyroxine  (SYNTHROID, LEVOTHROID) 50 MCG tablet TAKE 1 TABLET BY MOUTH DAILY BEFORE BREAKFAST Patient not taking: Reported on 05/18/2014 03/08/14   [name], DO   BP 179/50 mmHg  Pulse 66  Temp(Src) 98.6 F (37 C) (Oral)  Resp 16  SpO2 96% Physical Exam  Constitutional: She appears well-developed.  Elderly, frail  HENT:  Head: Normocephalic.  Right Ear: External ear normal.  Left Ear: External ear normal.  Vertical mid parietal laceration without associated crepitation or deformity  Eyes: Conjunctivae and EOM are normal. Pupils are equal, round, and reactive to light.  Neck: Normal range of motion and phonation normal. Neck supple.  Cardiovascular: Normal rate, regular rhythm and normal heart sounds.   Pulmonary/Chest: Effort normal and breath sounds normal. She exhibits no bony tenderness.  Abdominal: Soft. There is no tenderness.  Musculoskeletal: Normal range of motion.  No large joint deformity or limitation of motion.  Neurological: She is alert. No cranial nerve deficit or sensory deficit. She exhibits normal muscle tone. Coordination normal.  Skin: Skin is warm, dry and intact.  Superficial abrasion, left forearm. Skin of feet are somewhat reddened and swollen. She has poor toenail hygiene. No areas of fluctuance, drainage or deformity, of the skin of the feet or toes  Psychiatric: She has a normal mood and affect. Her behavior is normal.  Nursing note and vitals reviewed.  ED Course  Procedures (including critical care time)  Wound care, left arm, by nursing staff. Dressing placed.  Medications  bacitracin 500 UNIT/GM ointment (not administered)  Tdap (BOOSTRIX) injection 0.5 mL (0.5 mLs Intramuscular Given 05/18/14 0936)    Patient Vitals for the past 24 hrs:  BP Temp Temp src Pulse Resp SpO2  05/18/14 0837 (!) 179/50 mmHg 98.6 F (37 C) Oral 66 16 96 %  05/18/14 0641 192/59 mmHg 98.3 F (36.8 C) Oral 64 16 95 %    LACERATION REPAIR Performed by: Flint Melter Consent: Verbal consent obtained. Risks and benefits: risks, benefits and alternatives were discussed Patient identity confirmed: provided demographic data Time out performed prior to procedure Prepped and Draped in normal sterile fashion Wound explored Laceration Location: mid-parietal Laceration Length: 4.0 cm No Foreign Bodies seen or palpated Irrigation method: NS on gauze Amount of cleaning: standard Skin closure: staples Number of sutures or staples: 2 Patient tolerance: Patient tolerated the procedure well with no immediate complications.  11:22 AM Reevaluation with update and discussion. After initial assessment and treatment, an updated evaluation reveals clinical status is unchanged. Tammy Branch   Laboratory interface is down- laboratory highlights include metabolic panel normal except glucose high 106. Urinalysis normal.  Labs Review Labs Reviewed  CBC WITH DIFFERENTIAL/PLATELET - Abnormal; Notable for the following:    Hemoglobin 11.9 (*)    Neutrophils Relative % 84 (*)    Lymphocytes Relative 8 (*)    All other components within normal limits  URINE CULTURE  BASIC METABOLIC PANEL  URINALYSIS, ROUTINE W REFLEX MICROSCOPIC    Imaging Review Ct Head Wo Contrast  05/18/2014   CLINICAL DATA:  81 year old female who fell in the bathroom. Soft tissue injury, hip pain. Initial encounter.  EXAM: CT HEAD WITHOUT CONTRAST  CT CERVICAL SPINE WITHOUT CONTRAST  TECHNIQUE: Multidetector CT imaging of the head and cervical spine was performed following the standard protocol without intravenous contrast. Multiplanar CT image reconstructions of the cervical spine were also generated.  COMPARISON:  Head and cervical spine CT 11/29/2012.  FINDINGS: CT HEAD FINDINGS  Visualized orbit soft tissues are within normal limits. No scalp hematoma identified. Stable paranasal sinuses and mastoids. Calvarium intact.  Cerebral volume remains normal for age. No ventriculomegaly. No midline  shift, mass effect, or evidence of intracranial mass lesion. Mild Calcified atherosclerosis at the skull base. No suspicious intracranial vascular hyperdensity. Stable gray-white matter differentiation. Minimal to mild for age nonspecific cerebral white matter hypodensity. No acute intracranial hemorrhage identified.  CT CERVICAL SPINE FINDINGS  Stable cervical vertebral height and alignment. Visualized skull base is intact. No atlanto-occipital dissociation. Degenerative anterior C1-C2 articulation changes and degenerative ligamentous hypertrophy about the odontoid. Stable trace anterolisthesis at C4-C5, C5-C6. Cervicothoracic junction alignment is within normal limits. Bilateral posterior element alignment is within normal limits. Multilevel cervical facet and endplate degeneration. Progressed chronic left side C4-C5 facet arthropathy. No acute cervical spine fracture identified.  Mild compression of the anterior superior endplate of T1 is new since 2014. No retropulsed bone.  Otherwise grossly intact visualized upper thoracic levels. Negative lung apices. Tortuous proximal great vessels with calcified atherosclerosis. Otherwise negative noncontrast paraspinal soft tissues.  IMPRESSION: 1. Mild T1 superior endplate compression fracture is new since 2014 and likely acute. 2. No acute fracture or listhesis identified in the cervical spine. Ligamentous injury is not excluded. 3. No acute intracranial abnormality. Stable and largely unremarkable for age non contrast CT appearance of the brain. 4. Chronic cervical spine degeneration with mild  progression since 2014.   Electronically Signed   By: Odessa Fleming M.D.   On: 05/18/2014 09:18   Ct Cervical Spine Wo Contrast  05/18/2014   CLINICAL DATA:  81 year old female who fell in the bathroom. Soft tissue injury, hip pain. Initial encounter.  EXAM: CT HEAD WITHOUT CONTRAST  CT CERVICAL SPINE WITHOUT CONTRAST  TECHNIQUE: Multidetector CT imaging of the head and cervical spine  was performed following the standard protocol without intravenous contrast. Multiplanar CT image reconstructions of the cervical spine were also generated.  COMPARISON:  Head and cervical spine CT 11/29/2012.  FINDINGS: CT HEAD FINDINGS  Visualized orbit soft tissues are within normal limits. No scalp hematoma identified. Stable paranasal sinuses and mastoids. Calvarium intact.  Cerebral volume remains normal for age. No ventriculomegaly. No midline shift, mass effect, or evidence of intracranial mass lesion. Mild Calcified atherosclerosis at the skull base. No suspicious intracranial vascular hyperdensity. Stable gray-white matter differentiation. Minimal to mild for age nonspecific cerebral white matter hypodensity. No acute intracranial hemorrhage identified.  CT CERVICAL SPINE FINDINGS  Stable cervical vertebral height and alignment. Visualized skull base is intact. No atlanto-occipital dissociation. Degenerative anterior C1-C2 articulation changes and degenerative ligamentous hypertrophy about the odontoid. Stable trace anterolisthesis at C4-C5, C5-C6. Cervicothoracic junction alignment is within normal limits. Bilateral posterior element alignment is within normal limits. Multilevel cervical facet and endplate degeneration. Progressed chronic left side C4-C5 facet arthropathy. No acute cervical spine fracture identified.  Mild compression of the anterior superior endplate of T1 is new since 2014. No retropulsed bone.  Otherwise grossly intact visualized upper thoracic levels. Negative lung apices. Tortuous proximal great vessels with calcified atherosclerosis. Otherwise negative noncontrast paraspinal soft tissues.  IMPRESSION: 1. Mild T1 superior endplate compression fracture is new since 2014 and likely acute. 2. No acute fracture or listhesis identified in the cervical spine. Ligamentous injury is not excluded. 3. No acute intracranial abnormality. Stable and largely unremarkable for age non contrast CT  appearance of the brain. 4. Chronic cervical spine degeneration with mild progression since 2014.   Electronically Signed   By: Odessa Fleming M.D.   On: 05/18/2014 09:18     EKG Interpretation None      MDM   Final diagnoses:  Head injury, initial encounter  Scalp laceration, initial encounter  Arm abrasion, left, initial encounter    Fall with minor injuries. Doubt acute intracranial injury, preceding metabolic abnormality or serious bacterial infection.  Nursing Notes Reviewed/ Care Coordinated Applicable Imaging Reviewed Interpretation of Laboratory Data incorporated into ED treatment  The patient appears reasonably screened and/or stabilized for discharge and I doubt any other medical condition or other [facility] requiring further screening, evaluation, or treatment in the ED at this time prior to discharge.  Plan: Home Medications- usual; Home Treatments- rest, wound care at home; return here if the recommended treatment, does not improve the symptoms; Recommended follow up- PCP prn and staples out in 7 days     Mancel Bale, MD 05/18/14 1125

## 2014-05-18 NOTE — ED Notes (Signed)
Bed: RX54 Expected date:  Expected time:  Means of arrival:  Comments: EMS 81yo fall

## 2014-05-18 NOTE — ED Notes (Signed)
MD at bedside. 

## 2014-05-18 NOTE — ED Notes (Signed)
Pt escorted to discharge window. Verbalized understanding discharge instructions. In no acute distress.   

## 2014-05-18 NOTE — ED Notes (Signed)
Pt and Pt's family provided verbal vaccine education and provided written information sheet.  Pt's husband given vaccine card.

## 2014-05-19 LAB — URINE CULTURE
CULTURE: NO GROWTH
Colony Count: NO GROWTH

## 2014-05-27 ENCOUNTER — Ambulatory Visit (INDEPENDENT_AMBULATORY_CARE_PROVIDER_SITE_OTHER): Payer: Medicare Other | Admitting: Family Medicine

## 2014-05-27 ENCOUNTER — Encounter: Payer: Self-pay | Admitting: Family Medicine

## 2014-05-27 VITALS — BP 142/68 | HR 61 | Temp 97.8°F | Ht 65.0 in | Wt 140.8 lb

## 2014-05-27 DIAGNOSIS — F0390 Unspecified dementia without behavioral disturbance: Secondary | ICD-10-CM | POA: Diagnosis not present

## 2014-05-27 DIAGNOSIS — W19XXXD Unspecified fall, subsequent encounter: Secondary | ICD-10-CM | POA: Diagnosis not present

## 2014-05-27 DIAGNOSIS — S0101XD Laceration without foreign body of scalp, subsequent encounter: Secondary | ICD-10-CM | POA: Diagnosis not present

## 2014-05-27 DIAGNOSIS — S22000A Wedge compression fracture of unspecified thoracic vertebra, initial encounter for closed fracture: Secondary | ICD-10-CM | POA: Diagnosis not present

## 2014-05-27 NOTE — Progress Notes (Signed)
Pre visit review using our clinic review tool, if applicable. No additional management support is needed unless otherwise documented below in the visit note. 

## 2014-05-27 NOTE — Progress Notes (Signed)
  HPI:  Tammy Branch is a 81 yo pleasant female patient with PMH sig for Hypothyroidsim, HLD and dementia here for an acute visit for:  Fall: -mechanical fall without loc over bathroom doorsill about on 3/8, seen in ED with CT scans -mild comp fx upper T, but otherwise insig CT head and spine -2 staples to posthead lac er review records -son and pt report she is doing well -she refuses to use walker or cane -denies: HA, pain in neck or upper back, drainage or infection wound sites    ROS: See pertinent positives and negatives per HPI.  Past Medical History  Diagnosis Date  . Diverticulosis of colon   . Hyperlipidemia   . Hypertension   . Hypothyroidism   . Parkinson disease     Past Surgical History  Procedure Laterality Date  . Abdominal hysterectomy      Family History  Problem Relation Age of Onset  . Stroke Mother   . Cancer Father     prostate    History   Social History  . Marital Status: Married    Spouse Name: N/A  . Number of Children: N/A  . Years of Education: N/A   Social History Main Topics  . Smoking status: Never Smoker   . Smokeless tobacco: Never Used  . Alcohol Use: No  . Drug Use: No  . Sexual Activity: Not Currently   Other Topics Concern  . None   Social History Narrative     Current outpatient prescriptions:  .  donepezil (ARICEPT) 5 MG tablet, Take 1 tablet (5 mg total) by mouth at bedtime., Disp: 90 tablet, Rfl: 3 .  levothyroxine (SYNTHROID, LEVOTHROID) 50 MCG tablet, TAKE 1 TABLET BY MOUTH DAILY BEFORE BREAKFAST, Disp: 90 tablet, Rfl: 1 .  lisinopril (PRINIVIL,ZESTRIL) 40 MG tablet, TAKE 1 TABLET (40 MG TOTAL) BY MOUTH DAILY., Disp: 90 tablet, Rfl: 3  EXAM:  Filed Vitals:   05/27/14 1334  BP: 142/68  Pulse: 61  Temp: 97.8 F (36.6 C)    Body mass index is 23.43 kg/(m^2).  GENERAL: vitals reviewed and listed above, alert, oriented, appears well hydrated and in no acute distress  HEENT: atraumatic, conjunttiva  clear, no obvious abnormalities on inspection of external nose and ears  NECK: no obvious masses on inspection  LUNGS: clear to auscultation bilaterally, no wheezes, rales or rhonchi, good air movement  CV: HRRR, no peripheral edema  SKIN: well healed lac post head - two staples removed, skin tear R forearm, healing well  MS: moves all extremities without noticeable abnormality  PSYCH: pleasant and cooperative, no obvious depression or anxiety  ASSESSMENT AND PLAN:  Discussed the following assessment and plan:  Fall, subsequent encounter  Laceration of scalp, subsequent encounter  Compression fx, thoracic spine, closed, initial encounter  Dementia, without behavioral disturbance  -staples removed and stressed importance of fall prevention -they may try cane again, but pt usually refuses -have offered eval/tx of her dementia but family perfers to do only low dose aricept and home care - very attentive husband and son -no pain in area of comp fx, observe -Patient advised to return or notify a doctor immediately if symptoms worsen or persist or new concerns arise.  There are no Patient Instructions on file for this visit.   Kriste Basque R.

## 2014-08-05 ENCOUNTER — Ambulatory Visit: Payer: Medicare Other | Admitting: Family Medicine

## 2014-08-19 ENCOUNTER — Ambulatory Visit (INDEPENDENT_AMBULATORY_CARE_PROVIDER_SITE_OTHER): Payer: Medicare Other | Admitting: Family Medicine

## 2014-08-19 ENCOUNTER — Encounter: Payer: Self-pay | Admitting: Family Medicine

## 2014-08-19 VITALS — BP 130/70 | HR 69 | Temp 97.4°F | Ht 65.0 in | Wt 137.0 lb

## 2014-08-19 DIAGNOSIS — I8310 Varicose veins of unspecified lower extremity with inflammation: Secondary | ICD-10-CM | POA: Diagnosis not present

## 2014-08-19 DIAGNOSIS — E039 Hypothyroidism, unspecified: Secondary | ICD-10-CM | POA: Diagnosis not present

## 2014-08-19 DIAGNOSIS — R011 Cardiac murmur, unspecified: Secondary | ICD-10-CM | POA: Insufficient documentation

## 2014-08-19 DIAGNOSIS — I872 Venous insufficiency (chronic) (peripheral): Secondary | ICD-10-CM

## 2014-08-19 DIAGNOSIS — F0390 Unspecified dementia without behavioral disturbance: Secondary | ICD-10-CM

## 2014-08-19 DIAGNOSIS — I10 Essential (primary) hypertension: Secondary | ICD-10-CM

## 2014-08-19 DIAGNOSIS — R010 Benign and innocent cardiac murmurs: Secondary | ICD-10-CM

## 2014-08-19 HISTORY — DX: Cardiac murmur, unspecified: R01.1

## 2014-08-19 LAB — BASIC METABOLIC PANEL
BUN: 21 mg/dL (ref 6–23)
CALCIUM: 9.9 mg/dL (ref 8.4–10.5)
CHLORIDE: 104 meq/L (ref 96–112)
CO2: 30 mEq/L (ref 19–32)
Creatinine, Ser: 0.99 mg/dL (ref 0.40–1.20)
GFR: 55.89 mL/min — AB (ref >60.00)
Glucose, Bld: 81 mg/dL (ref 70–99)
Potassium: 4.2 mEq/L (ref 3.5–5.1)
Sodium: 138 mEq/L (ref 135–145)

## 2014-08-19 LAB — TSH: TSH: 2.48 u[IU]/mL (ref 0.35–4.50)

## 2014-08-19 NOTE — Progress Notes (Signed)
Pre visit review using our clinic review tool, if applicable. No additional management support is needed unless otherwise documented below in the visit note. 

## 2014-08-19 NOTE — Progress Notes (Signed)
HPI:  Follow up:  HTN/HLD: -meds: lisinopril 40 -stable  -denies: CP, SOB, DOE, swelling  Hypothyroidism: -meds: levothyroxine daily -stable  Chronic nasal rhinorrhea: -tried cough drops  Parkinson/Dementia: Offered tx/neuro referral numerous times - family not interested and prefer care at home On aricept  daily and stable -husband and son come to all appointments and seem and report happy to care for her well at home  Venous stasis dermatitis: -improved, wearing compression stockings and elevating -no increased swelling, pain or erythema  Chronic Urinary incontinence: -not worsening, urge  -no hematuira, abd pain, vag symptoms -son wishes she would wear the depends  ROS: See pertinent positives and negatives per HPI.  Past Medical History  Diagnosis Date  . Diverticulosis of colon   . Hyperlipidemia   . Hypertension   . Hypothyroidism   . Parkinson disease   . ONYCHOMYCOSIS, TOENAILS 02/25/2008    Qualifier: Diagnosis of  By: Cato Mulligan MD, Bruce    . Cellulitis of left lower leg 11/30/2012  . Undiagnosed cardiac murmurs 08/19/2014    Past Surgical History  Procedure Laterality Date  . Abdominal hysterectomy      Family History  Problem Relation Age of Onset  . Stroke Mother   . Cancer Father     prostate    History   Social History  . Marital Status: Married    Spouse Name: N/A  . Number of Children: N/A  . Years of Education: N/A   Social History Main Topics  . Smoking status: Never Smoker   . Smokeless tobacco: Never Used  . Alcohol Use: No  . Drug Use: No  . Sexual Activity: Not Currently   Other Topics Concern  . None   Social History Narrative     Current outpatient prescriptions:  .  donepezil (ARICEPT) 5 MG tablet, Take 1 tablet (5 mg total) by mouth at bedtime., Disp: 90 tablet, Rfl: 3 .  levothyroxine (SYNTHROID, LEVOTHROID) 50 MCG tablet, TAKE 1 TABLET BY MOUTH DAILY BEFORE BREAKFAST, Disp: 90 tablet, Rfl: 1 .   lisinopril (PRINIVIL,ZESTRIL) 40 MG tablet, TAKE 1 TABLET (40 MG TOTAL) BY MOUTH DAILY., Disp: 90 tablet, Rfl: 3  EXAM:  Filed Vitals:   08/19/14 1453  BP: 130/70  Pulse: 69  Temp: 97.4 F (36.3 C)    Body mass index is 22.8 kg/(m^2).  GENERAL: vitals reviewed and listed above, alert, oriented, appears well hydrated and in no acute distress  HEENT: atraumatic, conjunttiva clear, no obvious abnormalities on inspection of external nose and ears  NECK: no obvious masses on inspection  LUNGS: clear to auscultation bilaterally, no wheezes, rales or rhonchi, good air movement  CV: HRRR, II/VII SEM, no peripheral edema  MS: moves all extremities without noticeable abnormality  PSYCH: pleasant and cooperative, no obvious depression or anxiety  ASSESSMENT AND PLAN:  Discussed the following assessment and plan:  Essential hypertension - Plan: Basic metabolic panel -discussed new murmur, they declined eval - asymptomatic  Hypothyroidism, unspecified hypothyroidism type - Plan: TSH  Dementia, without behavioral disturbance -some urinary incontinence, discussed options and they decided to not do medications for now - may try timed voiding  Venous stasis dermatitis, unspecified laterality -cont compression  -stable -labs today, they have difficulty getting out - advised follow up in 6-12 months or as needed -Patient advised to return or notify a doctor immediately if symptoms worsen or persist or new concerns arise.  Patient Instructions  BEFORE YOU LEAVE: -labs  Follow up MEDICARE EXAM in 6-12  months     Kriste Basque R.

## 2014-08-19 NOTE — Patient Instructions (Signed)
BEFORE YOU LEAVE: -labs  Follow up MEDICARE EXAM in 6-12 months

## 2014-08-20 ENCOUNTER — Other Ambulatory Visit: Payer: Self-pay | Admitting: Family Medicine

## 2014-08-20 DIAGNOSIS — G20 Parkinson's disease: Secondary | ICD-10-CM

## 2014-08-20 MED ORDER — LEVOTHYROXINE SODIUM 50 MCG PO TABS: 50.0000 ug | ORAL_TABLET | Freq: Every day | ORAL | Status: DC

## 2014-08-20 MED ORDER — DONEPEZIL HCL 5 MG PO TABS: 5.0000 mg | ORAL_TABLET | Freq: Every day | ORAL | Status: DC

## 2014-08-20 MED ORDER — LISINOPRIL 40 MG PO TABS: ORAL_TABLET | ORAL | Status: DC

## 2014-08-28 ENCOUNTER — Other Ambulatory Visit: Payer: Self-pay | Admitting: Family Medicine

## 2014-10-15 ENCOUNTER — Ambulatory Visit: Payer: Medicare Other | Admitting: Family Medicine

## 2014-10-15 ENCOUNTER — Emergency Department (HOSPITAL_COMMUNITY): Payer: Medicare Other

## 2014-10-15 ENCOUNTER — Telehealth: Payer: Self-pay | Admitting: Family Medicine

## 2014-10-15 ENCOUNTER — Encounter (HOSPITAL_COMMUNITY): Payer: Self-pay

## 2014-10-15 ENCOUNTER — Emergency Department (HOSPITAL_COMMUNITY)
Admission: EM | Admit: 2014-10-15 | Discharge: 2014-10-15 | Disposition: A | Discharge status: Home / Self Care | Payer: Medicare Other | Attending: Emergency Medicine | Admitting: Emergency Medicine

## 2014-10-15 DIAGNOSIS — S01111A Laceration without foreign body of right eyelid and periocular area, initial encounter: Secondary | ICD-10-CM | POA: Diagnosis not present

## 2014-10-15 DIAGNOSIS — Z79899 Other long term (current) drug therapy: Secondary | ICD-10-CM | POA: Diagnosis not present

## 2014-10-15 DIAGNOSIS — Y9389 Activity, other specified: Secondary | ICD-10-CM | POA: Diagnosis not present

## 2014-10-15 DIAGNOSIS — Z872 Personal history of diseases of the skin and subcutaneous tissue: Secondary | ICD-10-CM | POA: Insufficient documentation

## 2014-10-15 DIAGNOSIS — W19XXXA Unspecified fall, initial encounter: Secondary | ICD-10-CM

## 2014-10-15 DIAGNOSIS — R011 Cardiac murmur, unspecified: Secondary | ICD-10-CM | POA: Diagnosis not present

## 2014-10-15 DIAGNOSIS — W1839XA Other fall on same level, initial encounter: Secondary | ICD-10-CM | POA: Diagnosis not present

## 2014-10-15 DIAGNOSIS — S0181XA Laceration without foreign body of other part of head, initial encounter: Secondary | ICD-10-CM

## 2014-10-15 DIAGNOSIS — S0591XA Unspecified injury of right eye and orbit, initial encounter: Secondary | ICD-10-CM | POA: Diagnosis present

## 2014-10-15 DIAGNOSIS — S0003XA Contusion of scalp, initial encounter: Secondary | ICD-10-CM | POA: Diagnosis not present

## 2014-10-15 DIAGNOSIS — Z8719 Personal history of other diseases of the digestive system: Secondary | ICD-10-CM | POA: Insufficient documentation

## 2014-10-15 DIAGNOSIS — Y998 Other external cause status: Secondary | ICD-10-CM | POA: Diagnosis not present

## 2014-10-15 DIAGNOSIS — I10 Essential (primary) hypertension: Secondary | ICD-10-CM | POA: Diagnosis not present

## 2014-10-15 DIAGNOSIS — Z8619 Personal history of other infectious and parasitic diseases: Secondary | ICD-10-CM | POA: Diagnosis not present

## 2014-10-15 DIAGNOSIS — S0990XA Unspecified injury of head, initial encounter: Secondary | ICD-10-CM | POA: Diagnosis not present

## 2014-10-15 DIAGNOSIS — Z8669 Personal history of other diseases of the nervous system and sense organs: Secondary | ICD-10-CM | POA: Diagnosis not present

## 2014-10-15 DIAGNOSIS — S79912A Unspecified injury of left hip, initial encounter: Secondary | ICD-10-CM | POA: Insufficient documentation

## 2014-10-15 DIAGNOSIS — T148XXA Other injury of unspecified body region, initial encounter: Secondary | ICD-10-CM

## 2014-10-15 DIAGNOSIS — M25552 Pain in left hip: Secondary | ICD-10-CM

## 2014-10-15 DIAGNOSIS — S199XXA Unspecified injury of neck, initial encounter: Secondary | ICD-10-CM | POA: Diagnosis not present

## 2014-10-15 DIAGNOSIS — M25551 Pain in right hip: Secondary | ICD-10-CM | POA: Diagnosis not present

## 2014-10-15 DIAGNOSIS — E039 Hypothyroidism, unspecified: Secondary | ICD-10-CM | POA: Insufficient documentation

## 2014-10-15 DIAGNOSIS — S51011A Laceration without foreign body of right elbow, initial encounter: Secondary | ICD-10-CM

## 2014-10-15 DIAGNOSIS — S3992XA Unspecified injury of lower back, initial encounter: Secondary | ICD-10-CM | POA: Diagnosis not present

## 2014-10-15 DIAGNOSIS — Y9289 Other specified places as the place of occurrence of the external cause: Secondary | ICD-10-CM | POA: Diagnosis not present

## 2014-10-15 MED ORDER — HYDROCODONE-ACETAMINOPHEN 5-325 MG PO TABS
1.0000 | ORAL_TABLET | Freq: Once | ORAL | Status: AC
  Administered 2014-10-15: 1 via ORAL
  Filled 2014-10-15: qty 1

## 2014-10-15 MED ORDER — BACITRACIN ZINC 500 UNIT/GM EX OINT
TOPICAL_OINTMENT | CUTANEOUS | Status: AC
  Administered 2014-10-15: 15:00:00
  Filled 2014-10-15: qty 0.9

## 2014-10-15 NOTE — Telephone Encounter (Signed)
Called and spoke with son-in-law and he reports cut happened around 2:30 this morning and has since stopped bleeding. Patient has been up, showered and is now resting. Plan to take her to ED to get evaluated shortly. Son-in-law reports he has called [facility] ED and plans to follow through with their instructions.

## 2014-10-15 NOTE — Telephone Encounter (Signed)
Patient Name: KATURAH SULEK DOB: 08/01/1921 Initial Comment Caller States mother-in-law fell and has cut about her right eye. seems a bit deep, was bleeding a lot but has not stopped. Nurse Assessment Nurse: Yetta Barre, RN, Miranda Date/Time (Eastern Time): 10/15/2014 8:41:10 AM Confirm and document reason for call. If symptomatic, describe symptoms. ---Caller states his mother in law fell about 6 hrs ago and hit her head. She has a cut on her upper right eye, bleeding has stopped. Has the patient traveled out of the country within the last 30 days? ---Not Applicable Does the patient require triage? ---Yes Related visit to physician within the last 2 weeks? ---No Does the PT have any chronic conditions? (i.e. diabetes, asthma, etc.) ---Yes List chronic conditions. ---Parkinson's Guidelines Guideline Title Affirmed Question Affirmed Notes Eye Injury Cut on eyelid or eyeball (Exception: superficial scratch on eyelid) Final Disposition User Go to ED Now Yetta Barre, RN, Miranda Referrals GO TO FACILITY UNDECIDED Disagree/Comply: Comply

## 2014-10-15 NOTE — ED Notes (Signed)
Family denies any change in patient's neurological status. Has performed ADL's as normal today. Right eye and surrounding area visibly swollen and maroon colored. Small abrasion to lateral side of right eye. Small abrasion to right elbow.

## 2014-10-15 NOTE — ED Notes (Signed)
Patient transported to CT 

## 2014-10-15 NOTE — Discharge Instructions (Signed)
Contusion A contusion is a deep bruise. Contusions are the result of an injury that caused bleeding under the skin. The contusion may turn blue, purple, or yellow. Minor injuries will give you a painless contusion, but more severe contusions may stay painful and swollen for a few weeks.  CAUSES  A contusion is usually caused by a blow, trauma, or direct force to an area of the body. SYMPTOMS   Swelling and redness of the injured area.  Bruising of the injured area.  Tenderness and soreness of the injured area.  Pain. DIAGNOSIS  The diagnosis can be made by taking a history and physical exam. An X-ray, CT scan, or MRI may be needed to determine if there were any associated injuries, such as fractures. TREATMENT  Specific treatment will depend on what area of the body was injured. In general, the best treatment for a contusion is resting, icing, elevating, and applying cold compresses to the injured area. Over-the-counter medicines may also be recommended for pain control. Ask your caregiver what the best treatment is for your contusion. HOME CARE INSTRUCTIONS   Put ice on the injured area.  Put ice in a plastic bag.  Place a towel between your skin and the bag.  Leave the ice on for 15-20 minutes, 3-4 times a day, or as directed by your health care provider.  Only take over-the-counter or prescription medicines for pain, discomfort, or fever as directed by your caregiver. Your caregiver may recommend avoiding anti-inflammatory medicines (aspirin, ibuprofen, and naproxen) for 48 hours because these medicines may increase bruising.  Rest the injured area.  If possible, elevate the injured area to reduce swelling. SEEK IMMEDIATE MEDICAL CARE IF:   You have increased bruising or swelling.  You have pain that is getting worse.  Your swelling or pain is not relieved with medicines. MAKE SURE YOU:   Understand these instructions.  Will watch your condition.  Will get help right  away if you are not doing well or get worse. Document Released: 12/06/2004 Document Revised: 03/03/2013 Document Reviewed: 01/01/2011 [facility] Patient Information 2015 [place], [place]. This information is not intended to replace advice given to you by your health care provider. Make sure you discuss any questions you have with your health care provider.  Facial Laceration A facial laceration is a cut on the face. These injuries can be painful and cause bleeding. Some cuts may need to be closed with stitches (sutures), skin adhesive strips, or wound glue. Cuts usually heal quickly but can leave a scar. It can take 1-2 years for the scar to go away completely. HOME CARE   Only take medicines as told by your doctor.  Follow your doctor's instructions for wound care. For Stitches:  Keep the cut clean and dry.  If you have a bandage (dressing), change it at least once a day. Change the bandage if it gets wet or dirty, or as told by your doctor.  Wash the cut with soap and water 2 times a day. Rinse the cut with water. Pat it dry with a clean towel.  Put a thin layer of medicated cream on the cut as told by your doctor.  You may shower after the first 24 hours. Do not soak the cut in water until the stitches are removed.  Have your stitches removed as told by your doctor.  Do not wear any makeup until a few days after your stitches are removed. For Skin Adhesive Strips:  Keep the cut clean and dry.  Do not get the strips wet. You may take a bath, but be careful to keep the cut dry.  If the cut gets wet, pat it dry with a clean towel.  The strips will fall off on their own. Do not remove the strips that are still stuck to the cut. For Wound Glue:  You may shower or take baths. Do not soak or scrub the cut. Do not swim. Avoid heavy sweating until the glue falls off on its own. After a shower or bath, pat the cut dry with a clean towel.  Do not put medicine or makeup on your cut until  the glue falls off.  If you have a bandage, do not put tape over the glue.  Avoid lots of sunlight or tanning lamps until the glue falls off.  The glue will fall off on its own in 5-10 days. Do not pick at the glue. After Healing: Put sunscreen on the cut for the first year to reduce your scar. GET HELP RIGHT AWAY IF:   Your cut area gets red, painful, or puffy (swollen).  You see a yellowish-white fluid (pus) coming from the cut.  You have chills or a fever. MAKE SURE YOU:   Understand these instructions.  Will watch your condition.  Will get help right away if you are not doing well or get worse. Document Released: 08/15/2007 Document Revised: 12/17/2012 Document Reviewed: 10/09/2012 [facility] Patient Information 2015 [place], [place]. This information is not intended to replace advice given to you by your health care provider. Make sure you discuss any questions you have with your health care provider.

## 2014-10-15 NOTE — ED Provider Notes (Signed)
CSN: [id number]     Arrival date & time 10/15/14  1231 History   First MD Initiated Contact with Patient 10/15/14 1328     Chief Complaint  Patient presents with  . Eye Injury  . Fall     (Consider location/radiation/quality/duration/timing/severity/associated sxs/prior Treatment) HPI Comments: Pt comes in after a fall in the middle of the night. Pt states that she was going to the bathroom and she thinks she got tangled in her feet. Has a history of similar falls. She was on the ground and was able to get up with help from her son who lives about 10 minutes from there house. Husband was in the house and there was no loc. She has a laceration to right eyelid. She states that she is having a hard time opening the right eye because it is matted shut. She is also c/o left hip pain  The history is provided by the patient and the spouse. No language interpreter was used.    Past Medical History  Diagnosis Date  . Diverticulosis of colon   . Hyperlipidemia   . Hypertension   . Hypothyroidism   . Parkinson disease   . ONYCHOMYCOSIS, TOENAILS 02/25/2008    Qualifier: Diagnosis of  By: Cato Mulligan MD, Bruce    . Cellulitis of left lower leg 11/30/2012  . Undiagnosed cardiac murmurs 08/19/2014   Past Surgical History  Procedure Laterality Date  . Abdominal hysterectomy     Family History  Problem Relation Age of Onset  . Stroke Mother   . Cancer Father     prostate   History  Substance Use Topics  . Smoking status: Never Smoker   . Smokeless tobacco: Never Used  . Alcohol Use: No   OB History    No data available     Review of Systems  All other systems reviewed and are negative.     Allergies  Citalopram hydrobromide  Home Medications   Prior to Admission medications   Medication Sig Start Date End Date Taking? Authorizing Provider  donepezil (ARICEPT) 5 MG tablet Take 1 tablet (5 mg total) by mouth at bedtime. 08/20/14  Yes [name], DO  levothyroxine (SYNTHROID,  LEVOTHROID) 50 MCG tablet Take 1 tablet (50 mcg total) by mouth daily before breakfast. 08/20/14  Yes [name], DO  lisinopril (PRINIVIL,ZESTRIL) 40 MG tablet TAKE 1 TABLET (40 MG TOTAL) BY MOUTH DAILY. 08/20/14  Yes [name], DO  levothyroxine (SYNTHROID, LEVOTHROID) 50 MCG tablet TAKE 1 TABLET BY MOUTH DAILY BEFORE BREAKFAST Patient not taking: Reported on 10/15/2014 08/30/14   [name], DO   BP 175/39 mmHg  Pulse 58  Temp(Src) 98.1 F (36.7 C) (Oral)  Resp 18  Ht 5' 6.5" (1.689 m)  Wt 140 lb (63.504 kg)  BMI 22.26 kg/m2  SpO2 98% Physical Exam  Constitutional: She is oriented to person, place, and time. She appears well-developed and well-nourished.  Cardiovascular: Normal rate and regular rhythm.   Pulmonary/Chest: Effort normal and breath sounds normal.  Abdominal: Soft. Bowel sounds are normal. There is no tenderness.  Musculoskeletal: Normal range of motion.  Tender on the left hip. No shortening or rotation noted.  Neurological: She is alert and oriented to person, place, and time. She exhibits normal muscle tone. Coordination normal.  Skin:  .5 cm laceration to the right eye lid. Contusion noted around the right eye. Skin tear noted to the right elbow. Pt has full rom  Nursing note and vitals reviewed.  ED Course  LACERATION REPAIR Date/Time: 10/15/2014 3:50 PM Performed by: Teressa Lower Authorized by: Teressa Lower Consent: Verbal consent obtained. Consent given by: patient Patient identity confirmed: verbally with patient Body area: head/neck Location details: right eyelid Laceration length: 0.5 cm Foreign bodies: no foreign bodies Irrigation solution: saline Skin closure: glue Approximation: close Approximation difficulty: simple Patient tolerance: Patient tolerated the procedure well with no immediate complications   (including critical care time) Labs Review Labs Reviewed - No data to display  Imaging Review Dg Cervical Spine  Complete  10/15/2014   CLINICAL DATA:  Fall  EXAM: CERVICAL SPINE  4+ VIEWS  COMPARISON:  CT cervical spine 05/18/2014  FINDINGS: Diffuse osseous demineralization.  Prevertebral soft tissues normal thickness.  Cervical vertebral body heights maintained.  Chronic anterior height loss of T1 vertebral body unchanged from prior CT corresponding to known fracture.  No additional fracture or bone destruction.  Minimal anterolisthesis C5-C6 unchanged.  Disc space narrowing C6-C7 with tiny endplate spurs.  Scattered mild facet degenerative changes.  Emphysematous changes within lungs.  Bony foramina grossly patent.  Atherosclerotic calcifications aorta and carotid systems bilaterally.  IMPRESSION: Degenerative disc and facet disease changes of the cervical spine.  Old superior endplate T1 compression fracture with mild anterior height loss.  No acute abnormalities.   Electronically Signed   By: Ulyses Southward M.D.   On: 10/15/2014 15:00   Dg Lumbar Spine Complete  10/15/2014   CLINICAL DATA:  Fall  EXAM: LUMBAR SPINE - COMPLETE 4+ VIEW  COMPARISON:  None  FINDINGS: Diffuse osseous demineralization.  Five non-rib-bearing lumbar vertebra.  Levoconvex scoliosis apex L3.  Minimal retrolisthesis L2-L3.  Vertebral body heights maintained without fracture or additional subluxation.  Few tiny scattered endplate spurs.  No bone destruction.  Scattered atherosclerotic calcifications.  IMPRESSION: Minimal degenerative changes and levoconvex scoliosis.  No acute abnormalities.   Electronically Signed   By: Ulyses Southward M.D.   On: 10/15/2014 14:56   Ct Head Wo Contrast  10/15/2014   CLINICAL DATA:  Pain following fall  EXAM: CT HEAD WITHOUT CONTRAST  CT MAXILLOFACIAL WITHOUT CONTRAST  TECHNIQUE: Multidetector CT imaging of the head and maxillofacial structures were performed using the standard protocol without intravenous contrast. Multiplanar CT image reconstructions of the maxillofacial structures were also generated.  COMPARISON:   Head CT May 18, 2014  FINDINGS: CT HEAD FINDINGS  There is age related volume loss. There is no intracranial mass, hemorrhage, extra-axial fluid collection, or midline shift. There is patchy small vessel disease in the centra semiovale bilaterally, stable. No acute infarct evident. Bony calvarium appears intact. Mastoid air cells are clear. There is debris in the left external auditory canal. There is an inferior right frontal scalp hematoma.  CT MAXILLOFACIAL FINDINGS  There is diffuse soft tissue swelling over the right face extending over the right orbit in a preseptal manner and into the inferior right frontal region anterior to the right frontal bone. Mild hemorrhage is noted at several sites, most notably superficial and anterior to the right zygomatic arch. There is no demonstrable fracture or dislocation. There is no intraorbital lesion. The orbits appear symmetric and normal bilaterally. The aerated paranasal sinuses are clear except for mild ethmoid air cell thickening anteriorly on both sides and debris in each sphenoid sinus. Frontal sinuses are aplastic. The ostiomeatal unit complexes are patent bilaterally. Nasal septum is midline. There is no nares obstruction.  Salivary glands appear symmetric and normal bilaterally. No adenopathy. There are foci of carotid artery calcification  bilaterally. Frontal sinuses are essentially aplastic. There is degenerative change in the visualized cervical spine without demonstrable upper cervical spine fracture or spondylolisthesis.  IMPRESSION: CT head: Age related volume loss with small vessel disease in the periventricular white matter. No intracranial mass, hemorrhage, or extra-axial fluid collection. No acute infarct. Inferior right frontal scalp hematoma. Probable cerumen in the left external auditory canal.  CT maxillofacial: Diffuse soft tissue swelling over the right face with areas of mild hemorrhage within the soft tissues. No fracture or dislocation. There  is patchy debris in each sphenoid sinus. Mild mucosal thickening is noted in the anterior ethmoid air cells. Frontal sinuses are aplastic. Ostiomeatal unit complexes are patent bilaterally. No intraorbital lesions. There are foci of carotid artery calcification bilaterally.   Electronically Signed   By: Bretta Bang III M.D.   On: 10/15/2014 14:38   Dg Hip Unilat With Pelvis 2-3 Views Right  10/15/2014   CLINICAL DATA:  Fall, right hip pain  EXAM: DG HIP (WITH OR WITHOUT PELVIS) 2-3V RIGHT  COMPARISON:  None.  FINDINGS: Pelvic bones intact. No evidence of femur fracture or dislocation. Mild bilateral hip arthritis noted.  IMPRESSION: No acute findings   Electronically Signed   By: Esperanza Heir M.D.   On: 10/15/2014 14:57   Ct Maxillofacial Wo Cm  10/15/2014   CLINICAL DATA:  Pain following fall  EXAM: CT HEAD WITHOUT CONTRAST  CT MAXILLOFACIAL WITHOUT CONTRAST  TECHNIQUE: Multidetector CT imaging of the head and maxillofacial structures were performed using the standard protocol without intravenous contrast. Multiplanar CT image reconstructions of the maxillofacial structures were also generated.  COMPARISON:  Head CT May 18, 2014  FINDINGS: CT HEAD FINDINGS  There is age related volume loss. There is no intracranial mass, hemorrhage, extra-axial fluid collection, or midline shift. There is patchy small vessel disease in the centra semiovale bilaterally, stable. No acute infarct evident. Bony calvarium appears intact. Mastoid air cells are clear. There is debris in the left external auditory canal. There is an inferior right frontal scalp hematoma.  CT MAXILLOFACIAL FINDINGS  There is diffuse soft tissue swelling over the right face extending over the right orbit in a preseptal manner and into the inferior right frontal region anterior to the right frontal bone. Mild hemorrhage is noted at several sites, most notably superficial and anterior to the right zygomatic arch. There is no demonstrable fracture  or dislocation. There is no intraorbital lesion. The orbits appear symmetric and normal bilaterally. The aerated paranasal sinuses are clear except for mild ethmoid air cell thickening anteriorly on both sides and debris in each sphenoid sinus. Frontal sinuses are aplastic. The ostiomeatal unit complexes are patent bilaterally. Nasal septum is midline. There is no nares obstruction.  Salivary glands appear symmetric and normal bilaterally. No adenopathy. There are foci of carotid artery calcification bilaterally. Frontal sinuses are essentially aplastic. There is degenerative change in the visualized cervical spine without demonstrable upper cervical spine fracture or spondylolisthesis.  IMPRESSION: CT head: Age related volume loss with small vessel disease in the periventricular white matter. No intracranial mass, hemorrhage, or extra-axial fluid collection. No acute infarct. Inferior right frontal scalp hematoma. Probable cerumen in the left external auditory canal.  CT maxillofacial: Diffuse soft tissue swelling over the right face with areas of mild hemorrhage within the soft tissues. No fracture or dislocation. There is patchy debris in each sphenoid sinus. Mild mucosal thickening is noted in the anterior ethmoid air cells. Frontal sinuses are aplastic. Ostiomeatal unit complexes are  patent bilaterally. No intraorbital lesions. There are foci of carotid artery calcification bilaterally.   Electronically Signed   By: Bretta Bang III M.D.   On: 10/15/2014 14:38     EKG Interpretation None      MDM   Final diagnoses:  Fall  Facial laceration, initial encounter  Contusion  Hematoma    No acute injury noted. Pt is neurologically intact. Pt is okay to follow up as needed. Wound closed. Pt able to ambulate without any problem   Teressa Lower, NP 10/15/14 1553  Azalia Bilis, MD 10/15/14 [phone]

## 2014-10-15 NOTE — ED Notes (Signed)
Patient was found around 0230 today by her husband lying in the floor. Patient unable to state how she fell. Patient  Has bruising, laceration and swelling to the right eye. Patient does not take blood thinners.

## 2014-10-18 NOTE — Telephone Encounter (Signed)
Dr Kim is aware of this. 

## 2014-12-06 ENCOUNTER — Ambulatory Visit (INDEPENDENT_AMBULATORY_CARE_PROVIDER_SITE_OTHER): Payer: Medicare Other | Admitting: Family Medicine

## 2014-12-06 ENCOUNTER — Encounter: Payer: Self-pay | Admitting: Family Medicine

## 2014-12-06 VITALS — BP 128/70 | HR 72 | Temp 97.8°F | Ht 66.5 in | Wt 132.6 lb

## 2014-12-06 DIAGNOSIS — R531 Weakness: Secondary | ICD-10-CM

## 2014-12-06 DIAGNOSIS — G20A1 Parkinson's disease without dyskinesia, without mention of fluctuations: Secondary | ICD-10-CM

## 2014-12-06 DIAGNOSIS — J069 Acute upper respiratory infection, unspecified: Secondary | ICD-10-CM | POA: Diagnosis not present

## 2014-12-06 DIAGNOSIS — G20 Parkinson's disease: Secondary | ICD-10-CM

## 2014-12-06 DIAGNOSIS — R350 Frequency of micturition: Secondary | ICD-10-CM

## 2014-12-06 DIAGNOSIS — E038 Other specified hypothyroidism: Secondary | ICD-10-CM

## 2014-12-06 DIAGNOSIS — F0390 Unspecified dementia without behavioral disturbance: Secondary | ICD-10-CM

## 2014-12-06 LAB — POCT URINALYSIS DIPSTICK
BILIRUBIN UA: NEGATIVE
Blood, UA: NEGATIVE
Glucose, UA: NEGATIVE
Ketones, UA: NEGATIVE
Nitrite, UA: NEGATIVE
Protein, UA: NEGATIVE
Spec Grav, UA: 1.025
UROBILINOGEN UA: 2
pH, UA: 6

## 2014-12-06 LAB — TSH: TSH: 3.86 u[IU]/mL (ref 0.35–4.50)

## 2014-12-06 NOTE — Patient Instructions (Signed)
BEFORE YOU LEAVE: -Flu shot -Urine dip with culture if abnormal -lab -Follow up as scheduled and as needed if symptoms worsen or persist  Okay to use Tylenol [phone] mg up to 2 times daily for discomfort

## 2014-12-06 NOTE — Addendum Note (Signed)
Addended by: [name] on: 12/06/2014 02:46 PM   Modules accepted: Orders

## 2014-12-06 NOTE — Addendum Note (Signed)
Addended by: Johnella Moloney on: 12/06/2014 03:09 PM   Modules accepted: Orders

## 2014-12-06 NOTE — Progress Notes (Signed)
Pre visit review using our clinic review tool, if applicable. No additional management support is needed unless otherwise documented below in the visit note. 

## 2014-12-06 NOTE — Progress Notes (Addendum)
HPI:  Tammy Branch is a 81 yo F with a PMH sig for dementia, HLD, parkinson's disease whom lives with husband and son and has been resistant to testing or any sig measures or further eval of her dementia, here for acute visit for:  Malaise and Fatigue: -TSH level normal 08/19/2014 -husband reports patient seems more tired the last few days -eating and drinking are normal with good appetite -other symptoms include nasal congestion and slight cough, urinary frequency -husband and patient deny difficulty breathing, falls, diarrhea, vomiting, excessive coughing, blood in stools, joint swelling, rash -son wants her to have flu shot -she continues to refuse most HM measures and refuses to use walker or cane   ROS: See pertinent positives and negatives per HPI.  Past Medical History  Diagnosis Date  . Diverticulosis of colon   . Hyperlipidemia   . Hypertension   . Hypothyroidism   . Parkinson disease   . ONYCHOMYCOSIS, TOENAILS 02/25/2008    Qualifier: Diagnosis of  By: Cato Mulligan MD, Bruce    . Cellulitis of left lower leg 11/30/2012  . Undiagnosed cardiac murmurs 08/19/2014    Past Surgical History  Procedure Laterality Date  . Abdominal hysterectomy      Family History  Problem Relation Age of Onset  . Stroke Mother   . Cancer Father     prostate    Social History   Social History  . Marital Status: Married    Spouse Name: N/A  . Number of Children: N/A  . Years of Education: N/A   Social History Main Topics  . Smoking status: Never Smoker   . Smokeless tobacco: Never Used  . Alcohol Use: No  . Drug Use: No  . Sexual Activity: Not Currently   Other Topics Concern  . None   Social History Narrative     Current outpatient prescriptions:  .  donepezil (ARICEPT) 5 MG tablet, Take 1 tablet (5 mg total) by mouth at bedtime., Disp: 90 tablet, Rfl: 3 .  levothyroxine (SYNTHROID, LEVOTHROID) 50 MCG tablet, Take 1 tablet (50 mcg total) by mouth daily before  breakfast., Disp: 90 tablet, Rfl: 3 .  lisinopril (PRINIVIL,ZESTRIL) 40 MG tablet, TAKE 1 TABLET (40 MG TOTAL) BY MOUTH DAILY., Disp: 90 tablet, Rfl: 3  EXAM:  Filed Vitals:   12/06/14 1423  BP: 128/70  Pulse: 72  Temp: 97.8 F (36.6 C)    Body mass index is 21.08 kg/(m^2).  GENERAL: vitals reviewed and listed above, alert, oriented, appears well hydrated and in no acute distress  HEENT: atraumatic, conjunttiva clear, no obvious abnormalities on inspection of external nose and ears, normal appearance of ear canals and TMs, clear nasal congestion, mild post oropharyngeal erythema with PND, no tonsillar edema or exudate, no sinus TTP  NECK: no obvious masses on inspection  LUNGS: clear to auscultation bilaterally, no wheezes, rales or rhonchi, good air movement  CV: HRRR, no peripheral edema  ABD: BS+, soft, NTTP, no cva TTP  MS: slow gait  PSYCH: pleasant and cooperative, no obvious depression or anxiety, flat affect  ASSESSMENT AND PLAN:  Discussed the following assessment and plan:  Acute upper respiratory infection  Urinary frequency  Weakness  -Suspect viral illness -We'll check urine and thyroid level -Supportive care and follow-up precautions discussed -Advised patient to use walker or cane for all ambulation, however she refuses, discussed other ways to prevent falls with his son and he plans to install hand rails by bed and in the bathroom and obtain  a bedside commode for use at night -flu shot today -Patient advised to return or notify a doctor immediately if symptoms worsen or persist or new concerns arise.  There are no Patient Instructions on file for this visit.   Kriste Basque R.

## 2014-12-08 LAB — URINE CULTURE
Colony Count: NO GROWTH
ORGANISM ID, BACTERIA: NO GROWTH

## 2014-12-22 ENCOUNTER — Telehealth: Payer: Self-pay | Admitting: Family Medicine

## 2014-12-22 NOTE — Telephone Encounter (Signed)
Appointment scheduled tomorrow with Dr. Selena Batten

## 2014-12-22 NOTE — Telephone Encounter (Signed)
[facility] - Client TELEPHONE ADVICE RECORD [facility] Patient Name: Tammy Branch DOB: [birth year] Initial Comment Caller states grandmother not getting any better, been having to have help getting out of bed, been going 5-6 times a night Nurse Assessment Nurse: Roderic Ovens, RN, Amy Date/Time (Eastern Time): 12/22/2014 1:06:08 PM Confirm and document reason for call. If symptomatic, describe symptoms. ---GRAND DAUGHTER STATES THAT SHE HAS BEEN BROUGHT IN FOR URINARY SYMPTOMS FOR THE PAST 2 WEEKS. SHE WAS HAVING COMPLAINTS OF BACK PROBLEMS. SHE IS LOSING CONTROL OF HER BLADDER FUNCTION. SHE IS GETTING UP AT 5-6 TIMES AT NIGHT. GRAND DAUGHTER WANTS TO KNOW IF THEY WOULD PUT HER ON MEDICATION TO KEEP HER FROM GOING SO MUCH. THEY HAVE A BEDSIDE COMMODE AND SHE CAN NOT GET UP TO GET TO THAT. GRAND-DAUGHTER DOES NOT KNOW IF SHE IS HAVING FOUL ODOR WITH THE URINE. SHE STATES THE GRAND FATHER WOULD HAVE THOSE ANSWERS. Has the patient traveled out of the country within the last 30 days? ---Not Applicable Does the patient have any new or worsening symptoms? ---Yes Will a triage be completed? ---Yes Related visit to physician within the last 2 weeks? ---Yes Does the PT have any chronic conditions? (i.e. diabetes, asthma, etc.) ---No Guidelines Guideline Title Affirmed Question Affirmed Notes Urinary Symptoms Urinating more frequently than usual (i.e., frequency) Final Disposition User See Physician within 24 Hours [place], Charity fundraiser, Amy Comments CALLER STATES THAT THE GRAND MOTHER HAD A FALL LESS THAN A MONTH AGO. SHE HAS COMPLAINED SINCE THE FALL ABOUT HER BACK. SHE HAS CONTINUED TO HAVE THESE ISSUES WITH HER URINE SINCE THEN AS WELL. SHE HAS BEEN TESTED FOR THE PAST 2 WEEKS WITH HER URINE WITHOUT ANY INFECTION NOTED. SHE STATES THAT HER GRAND FATHER IS HAVING TO GET HER UP ABOUT 5-6 TIMES DURING THE NIGHT TO BEDSIDE COMMODE AND THIS DOES NOT  COUNT THE NUMEROUS TIMES DURING THE DAY. THEY ARE WANTING TO KNOW IF SHE CAN BE PUT PLEASE NOTE: All timestamps contained within this report are represented as [place] Standard Time. CONFIDENTIALTY NOTICE: This fax transmission is intended only for the addressee. It contains information that is legally privileged, confidential or otherwise protected from use or disclosure. If you are not the intended recipient, you are strictly prohibited from reviewing, disclosing, copying using or disseminating any of this information or taking any action in reliance on or regarding this information. If you have received this fax in error, please notify us immediately by telephone so that we can arrange for its return to [place]. Phone: [phone], Toll-Free: [phone], Fax: [phone] Page: 2 of 2 Call Id: [id number] Comments ON SOMETHING LIKE VESICARE TO SLOW DOWN HER URINE. INSTRUCTED HER THAT WILL BE UP TO MD. Referrals REFERRED TO PCP OFFICE Disagree/Comply: Comply

## 2014-12-23 ENCOUNTER — Emergency Department (HOSPITAL_COMMUNITY)
Admission: EM | Admit: 2014-12-23 | Discharge: 2014-12-24 | Disposition: A | Discharge status: Home / Self Care | Payer: Medicare Other | Attending: Emergency Medicine | Admitting: Emergency Medicine

## 2014-12-23 ENCOUNTER — Emergency Department (HOSPITAL_COMMUNITY): Payer: Medicare Other

## 2014-12-23 ENCOUNTER — Telehealth: Payer: Self-pay | Admitting: Family Medicine

## 2014-12-23 ENCOUNTER — Ambulatory Visit: Payer: Self-pay | Admitting: Family Medicine

## 2014-12-23 ENCOUNTER — Encounter (HOSPITAL_COMMUNITY): Payer: Self-pay | Admitting: Emergency Medicine

## 2014-12-23 DIAGNOSIS — E039 Hypothyroidism, unspecified: Secondary | ICD-10-CM | POA: Insufficient documentation

## 2014-12-23 DIAGNOSIS — Z8719 Personal history of other diseases of the digestive system: Secondary | ICD-10-CM | POA: Diagnosis not present

## 2014-12-23 DIAGNOSIS — I10 Essential (primary) hypertension: Secondary | ICD-10-CM | POA: Insufficient documentation

## 2014-12-23 DIAGNOSIS — M549 Dorsalgia, unspecified: Secondary | ICD-10-CM | POA: Diagnosis not present

## 2014-12-23 DIAGNOSIS — R011 Cardiac murmur, unspecified: Secondary | ICD-10-CM | POA: Insufficient documentation

## 2014-12-23 DIAGNOSIS — Z872 Personal history of diseases of the skin and subcutaneous tissue: Secondary | ICD-10-CM | POA: Diagnosis not present

## 2014-12-23 DIAGNOSIS — S32010A Wedge compression fracture of first lumbar vertebra, initial encounter for closed fracture: Secondary | ICD-10-CM | POA: Diagnosis not present

## 2014-12-23 DIAGNOSIS — R32 Unspecified urinary incontinence: Secondary | ICD-10-CM | POA: Insufficient documentation

## 2014-12-23 DIAGNOSIS — R1032 Left lower quadrant pain: Secondary | ICD-10-CM | POA: Diagnosis not present

## 2014-12-23 DIAGNOSIS — M4856XA Collapsed vertebra, not elsewhere classified, lumbar region, initial encounter for fracture: Secondary | ICD-10-CM

## 2014-12-23 DIAGNOSIS — M8448XA Pathological fracture, other site, initial encounter for fracture: Secondary | ICD-10-CM | POA: Insufficient documentation

## 2014-12-23 DIAGNOSIS — G20 Parkinson's disease: Secondary | ICD-10-CM | POA: Diagnosis not present

## 2014-12-23 DIAGNOSIS — M545 Low back pain: Secondary | ICD-10-CM | POA: Diagnosis present

## 2014-12-23 DIAGNOSIS — S32019A Unspecified fracture of first lumbar vertebra, initial encounter for closed fracture: Secondary | ICD-10-CM | POA: Diagnosis not present

## 2014-12-23 DIAGNOSIS — Z8619 Personal history of other infectious and parasitic diseases: Secondary | ICD-10-CM | POA: Insufficient documentation

## 2014-12-23 LAB — COMPREHENSIVE METABOLIC PANEL
ALK PHOS: 104 U/L (ref 38–126)
ALT: 19 U/L (ref 14–54)
AST: 26 U/L (ref 15–41)
Albumin: 3.7 g/dL (ref 3.5–5.0)
Anion gap: 9 (ref 5–15)
BUN: 21 mg/dL — ABNORMAL HIGH (ref 6–20)
CALCIUM: 9.3 mg/dL (ref 8.9–10.3)
CHLORIDE: 103 mmol/L (ref 101–111)
CO2: 26 mmol/L (ref 22–32)
CREATININE: 0.92 mg/dL (ref 0.44–1.00)
GFR calc non Af Amer: 53 mL/min — ABNORMAL LOW (ref >60)
Glucose, Bld: 113 mg/dL — ABNORMAL HIGH (ref 65–99)
Potassium: 3.9 mmol/L (ref 3.5–5.1)
Sodium: 138 mmol/L (ref 135–145)
Total Bilirubin: 1.1 mg/dL (ref 0.3–1.2)
Total Protein: 7.5 g/dL (ref 6.5–8.1)

## 2014-12-23 LAB — URINE MICROSCOPIC-ADD ON

## 2014-12-23 LAB — CBC
HCT: 45.9 % (ref 36.0–46.0)
Hemoglobin: 14.9 g/dL (ref 12.0–15.0)
MCH: 29.7 pg (ref 26.0–34.0)
MCHC: 32.5 g/dL (ref 30.0–36.0)
MCV: 91.4 fL (ref 78.0–100.0)
PLATELETS: 264 10*3/uL (ref 150–400)
RBC: 5.02 MIL/uL (ref 3.87–5.11)
RDW: 14.1 % (ref 11.5–15.5)
WBC: 7.5 10*3/uL (ref 4.0–10.5)

## 2014-12-23 LAB — URINALYSIS, ROUTINE W REFLEX MICROSCOPIC
Bilirubin Urine: NEGATIVE
Glucose, UA: NEGATIVE mg/dL
KETONES UR: NEGATIVE mg/dL
LEUKOCYTES UA: NEGATIVE
NITRITE: NEGATIVE
PROTEIN: NEGATIVE mg/dL
Specific Gravity, Urine: 1.015 (ref 1.005–1.030)
Urobilinogen, UA: 2 mg/dL — ABNORMAL HIGH (ref 0.0–1.0)
pH: 7 (ref 5.0–8.0)

## 2014-12-23 LAB — LIPASE, BLOOD: Lipase: 19 U/L — ABNORMAL LOW (ref 22–51)

## 2014-12-23 MED ORDER — HYDROCODONE-ACETAMINOPHEN 5-325 MG PO TABS
1.0000 | ORAL_TABLET | Freq: Once | ORAL | Status: AC
Start: 2014-12-23 — End: 2014-12-23
  Administered 2014-12-23: 1 via ORAL
  Filled 2014-12-23: qty 1

## 2014-12-23 MED ORDER — ACETAMINOPHEN 500 MG PO TABS
1000.0000 mg | ORAL_TABLET | Freq: Once | ORAL | Status: AC
  Administered 2014-12-23: 1000 mg via ORAL
  Filled 2014-12-23: qty 2

## 2014-12-23 MED ORDER — HYDROCODONE-ACETAMINOPHEN 5-325 MG PO TABS
1.0000 | ORAL_TABLET | Freq: Once | ORAL | Status: AC
  Administered 2014-12-23: 1 via ORAL
  Filled 2014-12-23: qty 1

## 2014-12-23 NOTE — ED Notes (Signed)
Performed in and out on pt. Very little urine returned. Had resistance while inserting

## 2014-12-23 NOTE — ED Notes (Signed)
Pt reports back pain for over a month that has gradually gotten worse. Family states pt is now unable to get out of bed on her own. Pt also has an area of swelling on her L groin for the past several years that has recently begun to hurt accompanied by new urinary incontinence.

## 2014-12-23 NOTE — ED Notes (Signed)
Patient transported to MRI 

## 2014-12-23 NOTE — ED Notes (Signed)
Son removed TLSO brace without notification to Dr.

## 2014-12-23 NOTE — Progress Notes (Addendum)
Patient presents to Ed with generalized pain, increased urinary incontinence, and inability to walk.  EDCM asked to speak to patient and family regarding home health services.  Patient's husband and son in law at bedside.  EDCM provided patient with list of home health agencies in [facility].  Explained services.  EDCM also provided patient with list of private duty nursing agencies and explained it would be an out of pocket expense.  EDCM discussed Medicare guidelines regarding SNF placement. Patient's family confirm patient's pcp is Dr. Selena Batten. Patient and family confirm patient has a walker (which she will not use), a bedside commode and a wheelchair at home.  Patient's husband is elderly and has difficulty assisting patient at home.  Patient without preference as to which home health agency to choose.  [facility] chosen for home health services.  Patient reports she does not need any further dme at home.  EDRN reports patient is unable to ambulate even with the assistance of three RN's and walker.  Discussed with EDP who will speak to hospitalist for admission.  Roe Coombs patient's son in law [phone] and patient's home phone is [phone].  No further EDCM needs at this time.  [facility] consult placed. Patient to be admitted

## 2014-12-23 NOTE — ED Notes (Signed)
MD at bedside. 

## 2014-12-23 NOTE — ED Notes (Signed)
Ambulated patient with the assistance of three people.  She is unable to walk on her own.  She was unable to use walker due to lack of strength.  Patient could not pick up walker

## 2014-12-23 NOTE — ED Notes (Signed)
Informed MD that son removed TLSO brace

## 2014-12-23 NOTE — ED Provider Notes (Signed)
CSN: [id number]     Arrival date & time 12/23/14  1520 History   First MD Initiated Contact with Patient 12/23/14 1637     Chief Complaint  Patient presents with  . Back Pain  . Abdominal Pain     (Consider location/radiation/quality/duration/timing/severity/associated sxs/prior Treatment) HPI Comments: 81 year old female with past medical history including hypertension, hyperlipidemia, Parkinson's disease who presents with back pain and urinary incontinence. Patient has a long history of back pain but her son states that it seems to be worse recently. For the past 2 weeks she has been unable to get out of bed on her own. She has a long history of urinary incontinence but this has also gotten worse recently. She has an area of swelling in her left groin that has been there for many years and she was told previously that it was a hernia. She intermittently complains of pain in that area. No vomiting or fevers. No recent weight loss. She endorses occasional numbness in her feet but states that she mostly has back pain that radiates down her legs.  Patient is a 81 y.o. female presenting with back pain and abdominal pain. The history is provided by the patient and a relative.  Back Pain Associated symptoms: abdominal pain   Abdominal Pain   Past Medical History  Diagnosis Date  . Diverticulosis of colon   . Hyperlipidemia   . Hypertension   . Hypothyroidism   . Parkinson disease ([place])   . ONYCHOMYCOSIS, TOENAILS 02/25/2008    Qualifier: Diagnosis of  By: Leanne Chang MD, Bruce    . Cellulitis of left lower leg 11/30/2012  . Undiagnosed cardiac murmurs 08/19/2014   Past Surgical History  Procedure Laterality Date  . Abdominal hysterectomy     Family History  Problem Relation Age of Onset  . Stroke Mother   . Cancer Father     prostate   Social History  Substance Use Topics  . Smoking status: Never Smoker   . Smokeless tobacco: Never Used  . Alcohol Use: No   OB History    No data  available     Review of Systems  Gastrointestinal: Positive for abdominal pain.  Musculoskeletal: Positive for back pain.   10 Systems reviewed and are negative for acute change except as noted in the HPI.    Allergies  Citalopram hydrobromide  Home Medications   Prior to Admission medications   Medication Sig Start Date End Date Taking? Authorizing Provider  donepezil (ARICEPT) 5 MG tablet Take 1 tablet (5 mg total) by mouth at bedtime. 08/20/14  Yes Lucretia Kern, DO  ibuprofen (ADVIL,MOTRIN) 200 MG tablet Take 200 mg by mouth every 6 (six) hours as needed for moderate pain.   Yes Historical Provider, MD  levothyroxine (SYNTHROID, LEVOTHROID) 50 MCG tablet Take 1 tablet (50 mcg total) by mouth daily before breakfast. 08/20/14  Yes Lucretia Kern, DO  lisinopril (PRINIVIL,ZESTRIL) 40 MG tablet TAKE 1 TABLET (40 MG TOTAL) BY MOUTH DAILY. 08/20/14  Yes Hannah R Kim, DO   BP 171/58 mmHg  Pulse 74  Temp(Src) 98.1 F (36.7 C) (Oral)  Resp 12  SpO2 98% Physical Exam  Constitutional: She is oriented to person, place, and time. She appears well-developed and well-nourished. No distress.  HENT:  Head: Normocephalic and atraumatic.  Mouth/Throat: Oropharynx is clear and moist.  Moist mucous membranes  Eyes: Conjunctivae are normal. Pupils are equal, round, and reactive to light.  Neck: Neck supple.  Cardiovascular: Normal rate, regular rhythm,  normal heart sounds and intact distal pulses.   2/6 systolic murmur  Pulmonary/Chest: Effort normal and breath sounds normal.  Abdominal: Soft. Bowel sounds are normal. She exhibits no distension. There is no tenderness.  Swelling at left suprapubic abdomen on mons pubis which is mildly tender but reducible  Musculoskeletal: She exhibits no edema or tenderness.  5/5 strength and normal sensation throughout  Neurological: She is alert and oriented to person, place, and time. She exhibits normal muscle tone.  Fluent speech  Skin: Skin is warm and  dry. No rash noted.  Psychiatric: She has a normal mood and affect. Judgment normal.  Nursing note and vitals reviewed.   ED Course  Procedures (including critical care time) Labs Review Labs Reviewed  COMPREHENSIVE METABOLIC PANEL - Abnormal; Notable for the following:    Glucose, Bld 113 (*)    BUN 21 (*)    GFR calc non Af Amer 53 (*)    All other components within normal limits  URINALYSIS, ROUTINE W REFLEX MICROSCOPIC (NOT AT [facility]) - Abnormal; Notable for the following:    APPearance CLOUDY (*)    Hgb urine dipstick TRACE (*)    Urobilinogen, UA 2.0 (*)    All other components within normal limits  LIPASE, BLOOD - Abnormal; Notable for the following:    Lipase 19 (*)    All other components within normal limits  CBC  URINE MICROSCOPIC-ADD ON    Imaging Review Mr Thoracic Spine Wo Contrast  12/23/2014  CLINICAL DATA:  81 year old female with back pain for months gradually worsening. No history of cancer. Initial encounter. EXAM: MRI THORACIC AND LUMBAR SPINE WITHOUT CONTRAST TECHNIQUE: Multiplanar and multiecho pulse sequences of the thoracic and lumbar spine were obtained without intravenous contrast. COMPARISON:  None. FINDINGS: MR THORACIC SPINE FINDINGS No thoracic spine compression fracture, significant spinal stenosis or cord compression. No focal thoracic cord signal abnormality. Mild thoracic kyphosis. T5-6 tiny central protrusion. Multiple liver cysts and multiple gallstones within enlarged gallbladder incompletely assessed. Ectatic thoracic aorta are with ascending thoracic aorta measuring up to 4.1 cm incompletely assessed. MR LUMBAR SPINE FINDINGS Conus just below the L1-2 disc space. Compression fracture L1 vertebral body involving inferior endplate more so than superior endplate with 29% loss of height centrally. Retropulsion of the compressed vertebra with mild to moderate spinal stenosis and crowding of the distal cord/conus. I suspect this is a a benign osteoporotic  compression fracture. If patient did not respond to conservative therapy than followup imaging to exclude the less likely consideration a pathologic fracture may be considered. L2-3: Mild facet joint degenerative changes. Minimal urge listhesis. Minimal bulge. Very mild spinal stenosis. L3-4: Mild facet joint degenerative changes. L4-5: Mild facet joint degenerative changes. L5-S1:  Negative. IMPRESSION: MR THORACIC SPINE IMPRESSION No thoracic compression fracture or significant spinal stenosis. MR LUMBAR SPINE IMPRESSION Compression fracture L1 vertebral body with 80% loss of height centrally. Retropulsion of the compressed vertebra with mild to moderate spinal stenosis and crowding of the distal cord/conus. I suspect this is a a benign osteoporotic compression fracture. If patient did not respond to conservative therapy then followup imaging to exclude the less likely consideration a pathologic fracture may be considered. Liver cysts, prominent size gallbladder containing multiple gallstones and dilated ascending thoracic aorta (measuring up to 4.1 cm) incompletely assessed on present exam Electronically Signed   By: Genia Del M.D.   On: 12/23/2014 19:06   Mr Lumbar Spine Wo Contrast  12/23/2014  CLINICAL DATA:  81 year old female with  back pain for months gradually worsening. No history of cancer. Initial encounter. EXAM: MRI THORACIC AND LUMBAR SPINE WITHOUT CONTRAST TECHNIQUE: Multiplanar and multiecho pulse sequences of the thoracic and lumbar spine were obtained without intravenous contrast. COMPARISON:  None. FINDINGS: MR THORACIC SPINE FINDINGS No thoracic spine compression fracture, significant spinal stenosis or cord compression. No focal thoracic cord signal abnormality. Mild thoracic kyphosis. T5-6 tiny central protrusion. Multiple liver cysts and multiple gallstones within enlarged gallbladder incompletely assessed. Ectatic thoracic aorta are with ascending thoracic aorta measuring up to 4.1  cm incompletely assessed. MR LUMBAR SPINE FINDINGS Conus just below the L1-2 disc space. Compression fracture L1 vertebral body involving inferior endplate more so than superior endplate with 38% loss of height centrally. Retropulsion of the compressed vertebra with mild to moderate spinal stenosis and crowding of the distal cord/conus. I suspect this is a a benign osteoporotic compression fracture. If patient did not respond to conservative therapy than followup imaging to exclude the less likely consideration a pathologic fracture may be considered. L2-3: Mild facet joint degenerative changes. Minimal urge listhesis. Minimal bulge. Very mild spinal stenosis. L3-4: Mild facet joint degenerative changes. L4-5: Mild facet joint degenerative changes. L5-S1:  Negative. IMPRESSION: MR THORACIC SPINE IMPRESSION No thoracic compression fracture or significant spinal stenosis. MR LUMBAR SPINE IMPRESSION Compression fracture L1 vertebral body with 80% loss of height centrally. Retropulsion of the compressed vertebra with mild to moderate spinal stenosis and crowding of the distal cord/conus. I suspect this is a a benign osteoporotic compression fracture. If patient did not respond to conservative therapy then followup imaging to exclude the less likely consideration a pathologic fracture may be considered. Liver cysts, prominent size gallbladder containing multiple gallstones and dilated ascending thoracic aorta (measuring up to 4.1 cm) incompletely assessed on present exam Electronically Signed   By: Genia Del M.D.   On: 12/23/2014 19:06   I have personally reviewed and evaluated these lab results as part of my medical decision-making.   EKG Interpretation None     Medications  acetaminophen (TYLENOL) tablet 1,000 mg (1,000 mg Oral Given 12/23/14 1721)  HYDROcodone-acetaminophen (NORCO/VICODIN) 5-325 MG per tablet 1 tablet (1 tablet Oral Given 12/23/14 2126)  HYDROcodone-acetaminophen (NORCO/VICODIN) 5-325  MG per tablet 1 tablet (1 tablet Oral Given 12/23/14 2320)    MDM   Final diagnoses:  Back pain  old compression fracture of L1 Urinary incontinence Inguinal hernia   81 year old female with long history of lower back pain who presents for several weeks of worsening back pain making it difficult to get out of bed as well as worsening urinary incontinence. Patient uncomfortable but otherwise well-appearing at presentation. Vital signs notable for hypertension at 197/84. She had normal sensation bilateral feet and 2+ distal pulses. Obtained above lab work including a UA. She has an easily reducible inguinal hernia on exam with no evidence of incarceration.  I am concerned about the patient's progressive difficulty walking and urinary incontinence. DDx includes spinal stenosis, pathologic fractures w/ cord impingement. Obtained MRI T and L spine which showed compression fracture of L1 with 80% loss of height and mild retropulsion. I spoke with the neurosurgeon on call, Dr. Kathyrn Sheriff and appreciate his assistance. He has recommended TLSO brace which we have provided and supportive care including pain control. He states that there are no immediate surgical findings on her imaging and she would likely not be a good surgical candidate given her age. I have discussed imaging findings with the patient and her son. Regarding urinary incontinence,  it does not appear to be due to spinal cord impingement. I have instructed patient to follow-up with OB/GYN for urogynecology referral. Patient has received a Lortab in the emergency department. She continues to have difficulty ambulating with walker. Case management has met with family and initiated home health referral. I have discussed options including overnight observation admission with physical therapy assessment versus discharge home with pain medication and home health follow-up. Family has decided to take patient home. I have instructed to use extreme caution  with pain medications and have reviewed return precautions regarding the patient's back pain and hernia. Pt discharged in satisfactory condition.  Sharlett Iles, MD 12/24/14 [phone]

## 2014-12-23 NOTE — Telephone Encounter (Signed)
Patient Name: Tammy Branch  DOB: December 14, 1921    Initial Comment Caller states mother is experiencing back and abdominal pain. Also having problems with incontinence.    Nurse Assessment  Nurse: Stefano Gaul, RN, Dwana Curd Date/Time Lamount Cohen Time): 12/23/2014 2:34:24 PM  Confirm and document reason for call. If symptomatic, describe symptoms. ---Caller states mother in law has appt today at 3 pm. Has pain in her lower left abd and back pain. She is unable to get to the office because of the pain. She has a knot in her lower abd. No vomiting or diarrhea.  Has the patient traveled out of the country within the last 30 days? ---Not Applicable  Does the patient have any new or worsening symptoms? ---Yes  Will a triage be completed? ---Yes  Related visit to physician within the last 2 weeks? ---No  Does the PT have any chronic conditions? (i.e. diabetes, asthma, etc.) ---No     Guidelines    Guideline Title Affirmed Question Affirmed Notes  Abdominal Pain - Female [1] SEVERE pain AND [2] age > 77    Final Disposition User   Go to ED Now Stefano Gaul, RN, Vera    Referrals  [facility] - ED   Disagree/Comply: Comply

## 2014-12-24 ENCOUNTER — Telehealth: Payer: Self-pay | Admitting: Family Medicine

## 2014-12-24 DIAGNOSIS — M8448XA Pathological fracture, other site, initial encounter for fracture: Secondary | ICD-10-CM | POA: Diagnosis not present

## 2014-12-24 MED ORDER — HYDROCODONE-ACETAMINOPHEN 5-325 MG PO TABS: 1.0000 | ORAL_TABLET | Freq: Four times a day (QID) | ORAL | Status: DC | PRN

## 2014-12-24 NOTE — Patient Outreach (Signed)
[facility]) Care Management  12/24/2014  Tammy Branch 25-Mar-1921 [id number]   Referral from [facility] Emergency Department, assigned Egbert Garibaldi, RN (for Rowe Pavy, RN) to outreach for [facility] Care Management services.  Thanks, Corrie Mckusick. Sharlee Blew [facility] Care Management [facility] CM Assistant Phone: [phone] Fax: [phone]

## 2014-12-24 NOTE — Telephone Encounter (Signed)
Son states pt is ready to start a med for incontinence.  Son states they have discussed before but did not due to side effects. However pt went to ED last night for a fractured vertebrae. Pt is now wearing a brace and needs rx for the incontinence.  [facility] [address]

## 2014-12-24 NOTE — Care Management Note (Signed)
Case Management Note  Patient Details  Name: Tammy Branch MRN: [id number] Date of Birth: [birth year]  Subjective/Objective:   Patient presented to ED with generalized pain, increased urinary incontinence and difficulty with ambulation.                 Action/Plan:  Discussed home health services with patient and family.  Per EDP note, admission discussed with patient and family, patient's family opted to take patient home with home health follow up.   Expected Discharge Date:   12/24/2014               Expected Discharge Plan:  Home w Home Health Services  In-House Referral:     Discharge planning Services  CM Consult  Post Acute Care Choice:  Home Health Choice offered to:  Patient, Adult Children  DME Arranged:   (none required per patient) DME Agency:  [facility]  HH Arranged:  RN, PT, OT, Nurse's Aide, Social Work [employer] Agency:  [facility]  Status of Service:  Completed, signed off  Medicare Important Message Given:    Date Medicare IM Given:    Medicare IM give by:    Date Additional Medicare IM Given:    Additional Medicare Important Message give by:     If discussed at Long Length of Stay Meetings, dates discussed:    Additional Comments:  Patient discharged home with home health services.  EDCM informed Belenda Cruise of [facility] of need for home health RN PT, OT, aide and social worker ASAP if possible.  [facility] consult placed.  No further EDCM needs at this time.  Bennie Dallas, Kyrstal Monterrosa, RN 12/24/2014, 9:09 AM

## 2014-12-26 DIAGNOSIS — S32019D Unspecified fracture of first lumbar vertebra, subsequent encounter for fracture with routine healing: Secondary | ICD-10-CM | POA: Diagnosis not present

## 2014-12-26 DIAGNOSIS — K5790 Diverticulosis of intestine, part unspecified, without perforation or abscess without bleeding: Secondary | ICD-10-CM | POA: Diagnosis not present

## 2014-12-26 DIAGNOSIS — E785 Hyperlipidemia, unspecified: Secondary | ICD-10-CM | POA: Diagnosis not present

## 2014-12-26 DIAGNOSIS — G20 Parkinson's disease: Secondary | ICD-10-CM | POA: Diagnosis not present

## 2014-12-26 DIAGNOSIS — I10 Essential (primary) hypertension: Secondary | ICD-10-CM | POA: Diagnosis not present

## 2014-12-26 DIAGNOSIS — E039 Hypothyroidism, unspecified: Secondary | ICD-10-CM | POA: Diagnosis not present

## 2014-12-26 DIAGNOSIS — F329 Major depressive disorder, single episode, unspecified: Secondary | ICD-10-CM | POA: Diagnosis not present

## 2014-12-27 ENCOUNTER — Other Ambulatory Visit: Payer: Self-pay

## 2014-12-27 DIAGNOSIS — F329 Major depressive disorder, single episode, unspecified: Secondary | ICD-10-CM | POA: Diagnosis not present

## 2014-12-27 DIAGNOSIS — G20 Parkinson's disease: Secondary | ICD-10-CM | POA: Diagnosis not present

## 2014-12-27 DIAGNOSIS — K5790 Diverticulosis of intestine, part unspecified, without perforation or abscess without bleeding: Secondary | ICD-10-CM | POA: Diagnosis not present

## 2014-12-27 DIAGNOSIS — S32019D Unspecified fracture of first lumbar vertebra, subsequent encounter for fracture with routine healing: Secondary | ICD-10-CM | POA: Diagnosis not present

## 2014-12-27 DIAGNOSIS — E785 Hyperlipidemia, unspecified: Secondary | ICD-10-CM | POA: Diagnosis not present

## 2014-12-27 DIAGNOSIS — I10 Essential (primary) hypertension: Secondary | ICD-10-CM | POA: Diagnosis not present

## 2014-12-27 NOTE — Patient Outreach (Signed)
New referral/ ED referral: Placed screening call to patient. Patient unavailable. Spoke with husband who reports patient has home Geneticist, molecular and Child psychotherapist. Report social worker out to see patient today.  Explained [facility] services.  Husband declined.  Plan: Will close case for reason of refusal of services. Will notify MD.  Rowe Pavy, RN, BSN, CEN [facility] [place] Coordinator [phone]

## 2014-12-27 NOTE — Patient Outreach (Signed)
[facility]) Care Management  12/27/2014  Tammy Branch 1922/02/07 [id number]   Notification from Rowe Pavy, RN to close case due to patient refused [facility] Care Management services.  Thanks, Tammy Branch. Sharlee Blew [facility] Care Management [facility] CM Assistant Phone: [phone] Fax: [phone]

## 2014-12-28 ENCOUNTER — Telehealth: Payer: Self-pay | Admitting: Family Medicine

## 2014-12-28 DIAGNOSIS — G20 Parkinson's disease: Secondary | ICD-10-CM | POA: Diagnosis not present

## 2014-12-28 DIAGNOSIS — S32019D Unspecified fracture of first lumbar vertebra, subsequent encounter for fracture with routine healing: Secondary | ICD-10-CM | POA: Diagnosis not present

## 2014-12-28 DIAGNOSIS — F329 Major depressive disorder, single episode, unspecified: Secondary | ICD-10-CM | POA: Diagnosis not present

## 2014-12-28 DIAGNOSIS — E785 Hyperlipidemia, unspecified: Secondary | ICD-10-CM | POA: Diagnosis not present

## 2014-12-28 DIAGNOSIS — I10 Essential (primary) hypertension: Secondary | ICD-10-CM | POA: Diagnosis not present

## 2014-12-28 DIAGNOSIS — K5790 Diverticulosis of intestine, part unspecified, without perforation or abscess without bleeding: Secondary | ICD-10-CM | POA: Diagnosis not present

## 2014-12-28 NOTE — Telephone Encounter (Signed)
Pt needs hospital fup on thurs, but son states physical therapy scheduled her a visit on thurs from 2-3pm.  It will be difficult to make the 3:15 appt thurs.   Son wants to know if ok to come in and take the (2) @ 4:15 pm.  That is his only day off during the week.

## 2014-12-29 NOTE — Telephone Encounter (Signed)
Patient's son informed .

## 2014-12-29 NOTE — Telephone Encounter (Signed)
Son called back to advise they were able to adjust PT appt on Thurs. They will keep the 3:15 pm. Thank you.

## 2014-12-29 NOTE — Telephone Encounter (Signed)
Will discuss at follow-up.

## 2014-12-29 NOTE — Telephone Encounter (Signed)
ok 

## 2014-12-30 ENCOUNTER — Ambulatory Visit: Payer: Medicare Other | Admitting: Family Medicine

## 2014-12-30 ENCOUNTER — Encounter: Payer: Self-pay | Admitting: Family Medicine

## 2014-12-30 ENCOUNTER — Ambulatory Visit (INDEPENDENT_AMBULATORY_CARE_PROVIDER_SITE_OTHER): Payer: Medicare Other | Admitting: Family Medicine

## 2014-12-30 VITALS — BP 150/62 | HR 68 | Temp 98.1°F | Ht 62.0 in

## 2014-12-30 DIAGNOSIS — IMO0002 Reserved for concepts with insufficient information to code with codable children: Secondary | ICD-10-CM

## 2014-12-30 DIAGNOSIS — F0390 Unspecified dementia without behavioral disturbance: Secondary | ICD-10-CM

## 2014-12-30 DIAGNOSIS — M545 Low back pain: Secondary | ICD-10-CM

## 2014-12-30 DIAGNOSIS — G20 Parkinson's disease: Secondary | ICD-10-CM

## 2014-12-30 DIAGNOSIS — I10 Essential (primary) hypertension: Secondary | ICD-10-CM

## 2014-12-30 DIAGNOSIS — K5790 Diverticulosis of intestine, part unspecified, without perforation or abscess without bleeding: Secondary | ICD-10-CM | POA: Diagnosis not present

## 2014-12-30 DIAGNOSIS — F329 Major depressive disorder, single episode, unspecified: Secondary | ICD-10-CM | POA: Diagnosis not present

## 2014-12-30 DIAGNOSIS — E038 Other specified hypothyroidism: Secondary | ICD-10-CM | POA: Diagnosis not present

## 2014-12-30 DIAGNOSIS — E785 Hyperlipidemia, unspecified: Secondary | ICD-10-CM | POA: Diagnosis not present

## 2014-12-30 DIAGNOSIS — T148 Other injury of unspecified body region: Secondary | ICD-10-CM

## 2014-12-30 DIAGNOSIS — S32019D Unspecified fracture of first lumbar vertebra, subsequent encounter for fracture with routine healing: Secondary | ICD-10-CM | POA: Diagnosis not present

## 2014-12-30 MED ORDER — TRAMADOL HCL 50 MG PO TABS: 50.0000 mg | ORAL_TABLET | Freq: Two times a day (BID) | ORAL | Status: DC | PRN

## 2014-12-30 NOTE — Patient Instructions (Signed)
Schedule follow up in 3 months and as needed  Please consider a sitter at home to assist with 24/7 care   We placed a referral for you as discussed to discuss possible comfort care measures and to neurology for dementia. It usually takes about 1-2 weeks to process and schedule this referral. If you have not heard from [place] regarding this appointment in 2 weeks please contact our office.  For pain: Schedule tylenol 500-1000mg  2-3 times daily; use the tramadol not more then twice daily if pain despite the tylenol.

## 2014-12-30 NOTE — Progress Notes (Signed)
HPI:  Tammy Branch is a 81 yo F with a PMH sig for HLD, HTN, parkinson disease seen today for an acute visit for:  Back pain/Worseing Dementia/Falls: Seen and evaluated in ED 12/23/14 for back pain with MRI and dx L1 compression fx - nuerosurgery consulted per notes and advised TLSO brace and pain control. Case manager was consulted and pt was discharged home with home health to assist in pain control however has no nursing needs at this point. Pt has bedside commode and walker but she does not use them and made her son take them out of the house. She lives with husband and son and grandson are very active in her care per her son's report. He reports she has 24/7 care, but it is taxing on them. She is often confused. She has not used the pain medication (norco) provided in the Ed for > 3 days. She also has a reducible inguinal hernia. She has a long history of urinary incontinence, parkinsons disease w/ dementia and falls. They have refused neuro eval or treatment for this in the past, but now do want a referral to neurology now. Her incontinence is difficult to assess due to her dementia but is daily and she reports she can not hold her urine or make it to the bathroom when she needs to go.  She had a CT scan of the head a few months ago. She is not using the brace and per her son refuses to wear it. They deny further falls, fevers, HA, blood in urine, dysuria, vomiting, diarrhea, persistent abd pain, inability to ear.   ROS: See pertinent positives and negatives per HPI.  Past Medical History  Diagnosis Date  . Diverticulosis of colon   . Hyperlipidemia   . Hypertension   . Hypothyroidism   . Parkinson disease (HCC)   . ONYCHOMYCOSIS, TOENAILS 02/25/2008    Qualifier: Diagnosis of  By: Cato Mulligan MD, Bruce    . Cellulitis of left lower leg 11/30/2012  . Undiagnosed cardiac murmurs 08/19/2014    Past Surgical History  Procedure Laterality Date  . Abdominal hysterectomy      Family  History  Problem Relation Age of Onset  . Stroke Mother   . Cancer Father     prostate    Social History   Social History  . Marital Status: Married    Spouse Name: N/A  . Number of Children: N/A  . Years of Education: N/A   Social History Main Topics  . Smoking status: Never Smoker   . Smokeless tobacco: Never Used  . Alcohol Use: No  . Drug Use: No  . Sexual Activity: Not Currently   Other Topics Concern  . None   Social History Narrative     Current outpatient prescriptions:  .  donepezil (ARICEPT) 5 MG tablet, Take 1 tablet (5 mg total) by mouth at bedtime., Disp: 90 tablet, Rfl: 3 .  ibuprofen (ADVIL,MOTRIN) 200 MG tablet, Take 200 mg by mouth every 6 (six) hours as needed for moderate pain., Disp: , Rfl:  .  levothyroxine (SYNTHROID, LEVOTHROID) 50 MCG tablet, Take 1 tablet (50 mcg total) by mouth daily before breakfast., Disp: 90 tablet, Rfl: 3 .  lisinopril (PRINIVIL,ZESTRIL) 40 MG tablet, TAKE 1 TABLET (40 MG TOTAL) BY MOUTH DAILY., Disp: 90 tablet, Rfl: 3 .  traMADol (ULTRAM) 50 MG tablet, Take 1 tablet (50 mg total) by mouth every 12 (twelve) hours as needed for severe pain., Disp: 30 tablet, Rfl: 0  EXAM:  Filed Vitals:   12/30/14 1529  BP: 150/62  Pulse: 68  Temp: 98.1 F (36.7 C)    There is no weight on file to calculate BMI.  GENERAL: frail, in wheelchair  HEENT: atraumatic, conjunttiva clear, no obvious abnormalities on inspection of external nose and ears  NECK: no obvious masses on inspection  LUNGS: clear to auscultation bilaterally, no wheezes, rales or rhonchi, good air movement  CV: HRRR, no peripheral edema  ABD: reducible LLQ hernia  MS: moves all extremities without noticeable abnormality  PSYCH/NEURO: pleasant, speech is normal however cognitive dysfunction is worsening, she keeps asking if she can leave now and reports she wants to get up and run, can stand with help, tremor in UEs  ASSESSMENT AND PLAN:  Discussed the  following assessment and plan:  Parkinson disease (HCC) - Plan: Ambulatory referral to Neurology  Dementia, without behavioral disturbance - Plan: Ambulatory referral to Neurology, Consult to palliative care  Other specified hypothyroidism  Essential hypertension  Compression fracture - Plan: Consult to palliative care  Midline low back pain, with sciatica presence unspecified - Plan: Consult to palliative care  I spent > 30 minutes face to face with pt and son - pt seemed frustated at first and complained that in the hospital nobody would help them with a different brace or other recommendations for the compression fx even though he reports he told them she did not want to wear the brace, he also complained that Dr. Cato Mulligan and I had not treated the patient's Parkinson's disease and that he is upset about her hernia and that they were told in the hospital that there is not likely a repair option. On review of notes, both Dr. Cato Mulligan and myself have offered further eval and treatment and referral to neurology for her dementia on numerous occasions, but in the past, all that the son and husband agreed to was low dose aricept. I explained that despite that, she has done suprisingly well. We discussed various tx options and they do wish to pursue a consult with neurology to see if anything can be done to help her dementia, tremors and falls. I gently tried to explain that I do feel her dementia is making care difficult as she refuses to use the brace, walker, bedside commode, etc, that dementia is usually progressive, and that there is a chance that tx will not improve her symptoms.  We opted to schedule tylenol and use tramadol for breakthrough pain. I offered a nsu or ortho consult to assist in options for other braces but decided not to do this given son feels she will not wear. We discussed various types of urinary incontinence and treatment options, though in this case there is likely a mixed type and  BOO. We could refer her to urology, but opted to hold off on this for now. We also discussed her need for 24/7 assistance, sitter options, palliative care options, etc to alleviate the burden to the family. They are agreeable to a palliative consult to see if she would qualify and to discuss and I hope prepare advanced directives. i have asked them to call or office for any questions or concerns. -Patient advised to return or notify a doctor immediately if symptoms worsen or persist or new concerns arise.  There are no Patient Instructions on file for this visit.   Kriste Basque R.

## 2014-12-30 NOTE — Progress Notes (Signed)
Pre visit review using our clinic review tool, if applicable. No additional management support is needed unless otherwise documented below in the visit note. Weight not measured today due to patient being in a wheelchair. 

## 2014-12-31 DIAGNOSIS — S32019D Unspecified fracture of first lumbar vertebra, subsequent encounter for fracture with routine healing: Secondary | ICD-10-CM | POA: Diagnosis not present

## 2014-12-31 DIAGNOSIS — G20 Parkinson's disease: Secondary | ICD-10-CM | POA: Diagnosis not present

## 2014-12-31 DIAGNOSIS — I10 Essential (primary) hypertension: Secondary | ICD-10-CM | POA: Diagnosis not present

## 2014-12-31 DIAGNOSIS — E785 Hyperlipidemia, unspecified: Secondary | ICD-10-CM | POA: Diagnosis not present

## 2014-12-31 DIAGNOSIS — K5790 Diverticulosis of intestine, part unspecified, without perforation or abscess without bleeding: Secondary | ICD-10-CM | POA: Diagnosis not present

## 2014-12-31 DIAGNOSIS — F329 Major depressive disorder, single episode, unspecified: Secondary | ICD-10-CM | POA: Diagnosis not present

## 2015-01-03 DIAGNOSIS — K5790 Diverticulosis of intestine, part unspecified, without perforation or abscess without bleeding: Secondary | ICD-10-CM | POA: Diagnosis not present

## 2015-01-03 DIAGNOSIS — F329 Major depressive disorder, single episode, unspecified: Secondary | ICD-10-CM | POA: Diagnosis not present

## 2015-01-03 DIAGNOSIS — S32019D Unspecified fracture of first lumbar vertebra, subsequent encounter for fracture with routine healing: Secondary | ICD-10-CM | POA: Diagnosis not present

## 2015-01-03 DIAGNOSIS — G20 Parkinson's disease: Secondary | ICD-10-CM | POA: Diagnosis not present

## 2015-01-03 DIAGNOSIS — E785 Hyperlipidemia, unspecified: Secondary | ICD-10-CM | POA: Diagnosis not present

## 2015-01-03 DIAGNOSIS — I10 Essential (primary) hypertension: Secondary | ICD-10-CM | POA: Diagnosis not present

## 2015-01-13 DIAGNOSIS — S32019D Unspecified fracture of first lumbar vertebra, subsequent encounter for fracture with routine healing: Secondary | ICD-10-CM | POA: Diagnosis not present

## 2015-02-24 ENCOUNTER — Ambulatory Visit: Payer: Medicare Other | Admitting: Family Medicine

## 2015-04-01 ENCOUNTER — Telehealth: Payer: Self-pay | Admitting: Family Medicine

## 2015-04-01 NOTE — Telephone Encounter (Signed)
Pt son in law call to ask for a letter stating that pt can make her own decision.

## 2015-04-01 NOTE — Telephone Encounter (Signed)
Son states their is no change in mental states. Lawyer states they are fine with this also.  Lawyer went through a process with pt and Tammy Branch is fine with the findings she is competent   Just need a letter from you stating that: Pt is competent and has the mental capability to make a decision on her will and the division of her property. Thanks you.

## 2015-04-01 NOTE — Telephone Encounter (Signed)
Don returned your call and would like a call back. thanks

## 2015-04-01 NOTE — Telephone Encounter (Signed)
I left a message for Tammy Branch to return my call.

## 2015-04-01 NOTE — Telephone Encounter (Signed)
Patient will need an evaluation with neurology regarding her dementia and confusion. If they need help scheduling that appointment please let me know. Thank you.

## 2015-04-01 NOTE — Telephone Encounter (Signed)
I called Don and informed him of the message below per Dr Selena Batten. He was given the phone number to call [facility] for an appt for the pt.

## 2015-04-06 ENCOUNTER — Telehealth: Payer: Self-pay | Admitting: Family Medicine

## 2015-04-14 ENCOUNTER — Ambulatory Visit: Payer: Medicare Other | Admitting: Family Medicine

## 2015-04-21 ENCOUNTER — Encounter: Payer: Self-pay | Admitting: Neurology

## 2015-04-21 ENCOUNTER — Ambulatory Visit (INDEPENDENT_AMBULATORY_CARE_PROVIDER_SITE_OTHER): Payer: Medicare Other | Admitting: Neurology

## 2015-04-21 VITALS — BP 158/58 | HR 60 | Wt 126.0 lb

## 2015-04-21 DIAGNOSIS — N39498 Other specified urinary incontinence: Secondary | ICD-10-CM | POA: Diagnosis not present

## 2015-04-21 DIAGNOSIS — G301 Alzheimer's disease with late onset: Secondary | ICD-10-CM | POA: Diagnosis not present

## 2015-04-21 DIAGNOSIS — F0280 Dementia in other diseases classified elsewhere without behavioral disturbance: Secondary | ICD-10-CM | POA: Diagnosis not present

## 2015-04-21 NOTE — Progress Notes (Signed)
Tammy Branch was seen today in the movement disorders clinic for neurologic consultation at the request of [name]., DO.   The patient is seen today, accompanied by her son in law who supplements the history.  Patient apparently has a long history of Parkinson's disease, but has not been seen by neurology (according to records, this was by the family's choice).  Her son states that she was diagnosed years ago with PD while she was in the hospital but she has never been seen by an outpatient neurologist.  She is on no medication for Parkinson's disease.  Her son in law doesn't know what the first sx was or when it occurred.  The patient is now experiencing memory loss..  She is on low-dose Aricept, 5 mg daily.  Specific Symptoms:  Tremor: Yes.  , mostly with writing (not at rest) Voice: no changes per pt/son Sleep: not sleeping well  Vivid Dreams:  No.  Acting out dreams:  No. Wet Pillows: Yes.   Postural symptoms:  Yes.  , but minimally so (and just problem getting up)  Falls?  Yes.   (usually in the middle of the night; pts husband is 67 years old) Bradykinesia symptoms: slow movements, drooling while awake and difficulty getting out of a chair Loss of smell:  No. Loss of taste:  No. Urinary Incontinence:  Yes.   Difficulty Swallowing:  No. Handwriting, micrographia: Yes.   Trouble with ADL's:  Yes.   (needs assistance)  Trouble buttoning clothing: Yes.   Depression: pt doesn't answer Memory changes:  Yes.   (husband does cooking; husband administers meds; doesn't drive and hasn't driven in long time) Hallucinations:  No.  visual distortions: No. N/V:  No. Lightheaded:  No.  Syncope: No. Diplopia:  No.   ALLERGIES:   Allergies  Allergen Reactions  . Citalopram Hydrobromide     REACTION: hallucinations    CURRENT MEDICATIONS:  Outpatient Encounter Prescriptions as of 04/21/2015  Medication Sig  . donepezil (ARICEPT) 5 MG tablet Take 1 tablet (5 mg total) by mouth  at bedtime.  Marland Kitchen levothyroxine (SYNTHROID, LEVOTHROID) 50 MCG tablet Take 1 tablet (50 mcg total) by mouth daily before breakfast.  . lisinopril (PRINIVIL,ZESTRIL) 40 MG tablet TAKE 1 TABLET (40 MG TOTAL) BY MOUTH DAILY.  . [DISCONTINUED] ibuprofen (ADVIL,MOTRIN) 200 MG tablet Take 200 mg by mouth every 6 (six) hours as needed for moderate pain.  . [DISCONTINUED] traMADol (ULTRAM) 50 MG tablet Take 1 tablet (50 mg total) by mouth every 12 (twelve) hours as needed for severe pain.   No facility-administered encounter medications on file as of 04/21/2015.    PAST MEDICAL HISTORY:   Past Medical History  Diagnosis Date  . Diverticulosis of colon   . Hyperlipidemia   . Hypertension   . Hypothyroidism   . Parkinson disease (HCC)   . ONYCHOMYCOSIS, TOENAILS 02/25/2008    Qualifier: Diagnosis of  By: Cato Mulligan MD, Bruce    . Cellulitis of left lower leg 11/30/2012  . Undiagnosed cardiac murmurs 08/19/2014    PAST SURGICAL HISTORY:   Past Surgical History  Procedure Laterality Date  . Abdominal hysterectomy      SOCIAL HISTORY:   Social History   Social History  . Marital Status: Married    Spouse Name: N/A  . Number of Children: N/A  . Years of Education: N/A   Occupational History  . Not on file.   Social History Main Topics  . Smoking status: Never Smoker   .  Smokeless tobacco: Never Used  . Alcohol Use: No  . Drug Use: No  . Sexual Activity: Not Currently   Other Topics Concern  . Not on file   Social History Narrative    FAMILY HISTORY:   Family Status  Relation Status Death Age  . Mother Deceased 33    stroke  . Father Deceased 72    prostate cancer    ROS:  Pt doesn't participate  PHYSICAL EXAMINATION:    VITALS:   Filed Vitals:   04/21/15 1415  BP: 158/58  Pulse: 60  Weight: 126 lb (57.153 kg)    GEN:  The patient appears stated age and is in NAD. HEENT:  Normocephalic, atraumatic.  The mucous membranes are moist. The superficial temporal arteries are  without ropiness or tenderness. CV:  RRR Lungs:  CTAB Neck/HEME:  There are no carotid bruits bilaterally.  Neurological examination:  Orientation:  Montreal Cognitive Assessment  04/21/2015  Visuospatial/ Executive (0/5) 0  Naming (0/3) 1  Attention: Read list of digits (0/2) 1  Attention: Read list of letters (0/1) 0  Attention: Serial 7 subtraction starting at 100 (0/3) 0  Language: Repeat phrase (0/2) 2  Language : Fluency (0/1) 0  Abstraction (0/2) 1  Delayed Recall (0/5) 0  Orientation (0/6) 2  Total 7  Adjusted Score (based on education) 8   Cranial nerves: There is good facial symmetry. Pupils are equal and minimally reactive.  Funduscopic exam is attempted but the disc margins are not well visualized.  She does not participate with formal extraocular muscle testing or formal visual field confrontational testing, but does track about the room appropriately.  The speech is very limited, but when she speaks it is fluent and clear.   Soft palate rises symmetrically and there is no tongue deviation. Hearing is intact to conversational tone. Sensation: Sensation is intact to light and pinprick throughout (facial, trunk, extremities). Vibration is intact at the bilateral knee, but she has significant difficulty with participating with more sensitive aspects of the sensory testing. Motor:  Good grip strength bilaterally.  She has difficulty with manual motor testing because of apraxia.  Strength is at least 4+/5 in the bilateral upper and lower extremities. Deep tendon reflexes: Deep tendon reflexes are 1/4 at the bilateral biceps, triceps, brachioradialis, 2 -/4 at the bilateral patella and trace at the bilateral upper achilles. Plantar responses are downgoing bilaterally.  Movement examination: Tone: There is normal tone in the bilateral upper extremities.  The tone in the lower extremities is normal.  Abnormal movements: There is no rest tremor.  She is mildly tremulous when she holds  out her hands and when she writes.  She exhibits restricted, repetitive behavior and stereotypic movements in the form of touching her skin and taking a Kleenex and wiping her nose repetitively. Coordination:  There is no decremation with RAM's, but the ability to assess this is limited due to significant apraxia Gait and Station: The patient   ASSESSMENT/PLAN:  1.  Advanced dementia, likely Alzheimer's dementia  -I had a long discussion with the family and the patient today.  While the patient certainly needs 24-hour per day care and has this (her son-in-law states that her elderly husband is mentally competent), I do feel that she likely needs a financial and healthcare power of attorney.  I do not feel it is in her best interest from a medical standpoint to be taking care of her own affairs.  Her family wanted a formal competency evaluation  and thought that is what they were getting whenever coming here today but unfortunately, we do not do those here.  They wanted a letter stating that she was competent to sign her own paperwork, which I do not feel comfortable doing given that this is a primary legal issue.  I am happy to refer her for a competency evaluation, although they would have to go out of town as no one in our local vicinity does these that I know of.    -I would continue with the Aricept, 5 mg daily.  I talked to him about Namenda, but in the end we all agreed to hold this.  -I do not see any evidence of idiopathic Parkinson's disease.  She does exhibit repetitive, stereotypic movements which can happen in patients who have dementia. 2.  Urinary incontinence  -Her son-in-law stated that her primary care physician did not feel comfortable giving her any medication until she saw me.  I was not able to find notes in that regard, but it is true that many of the medications for the bladder can cause increased confusion and increased risk for falls.  However, Myrbetriq does have less of a chance  of this.  I will leave this to her primary care physician, as this is far out of my area of expertise. 3.  F/u prn.  Much greater than 50% of this visit was spent in counseling with the patient and the family.  Total face to face time:  45 min

## 2015-04-22 NOTE — Telephone Encounter (Signed)
Pt will go to neuro

## 2015-08-24 ENCOUNTER — Other Ambulatory Visit: Payer: Self-pay | Admitting: Family Medicine

## 2015-08-24 NOTE — Telephone Encounter (Signed)
Rx refill sent to pharmacy. 

## 2015-10-22 ENCOUNTER — Other Ambulatory Visit: Payer: Self-pay | Admitting: Family Medicine

## 2015-10-31 ENCOUNTER — Other Ambulatory Visit: Payer: Self-pay | Admitting: Family Medicine

## 2015-10-31 DIAGNOSIS — G20 Parkinson's disease: Secondary | ICD-10-CM

## 2015-11-04 ENCOUNTER — Other Ambulatory Visit: Payer: Self-pay

## 2015-11-19 ENCOUNTER — Other Ambulatory Visit: Payer: Self-pay | Admitting: Family Medicine

## 2016-01-30 ENCOUNTER — Encounter: Payer: Self-pay | Admitting: Family Medicine

## 2016-01-30 ENCOUNTER — Ambulatory Visit (INDEPENDENT_AMBULATORY_CARE_PROVIDER_SITE_OTHER): Payer: Medicare Other | Admitting: Family Medicine

## 2016-01-30 VITALS — BP 128/68 | HR 75 | Temp 98.2°F | Ht 62.0 in | Wt 118.8 lb

## 2016-01-30 DIAGNOSIS — M7989 Other specified soft tissue disorders: Secondary | ICD-10-CM

## 2016-01-30 DIAGNOSIS — Z23 Encounter for immunization: Secondary | ICD-10-CM

## 2016-01-30 DIAGNOSIS — R21 Rash and other nonspecific skin eruption: Secondary | ICD-10-CM | POA: Diagnosis not present

## 2016-01-30 DIAGNOSIS — L03116 Cellulitis of left lower limb: Secondary | ICD-10-CM | POA: Diagnosis not present

## 2016-01-30 DIAGNOSIS — I10 Essential (primary) hypertension: Secondary | ICD-10-CM | POA: Diagnosis not present

## 2016-01-30 DIAGNOSIS — F0390 Unspecified dementia without behavioral disturbance: Secondary | ICD-10-CM

## 2016-01-30 DIAGNOSIS — E038 Other specified hypothyroidism: Secondary | ICD-10-CM

## 2016-01-30 LAB — CBC
HEMATOCRIT: 42.9 % (ref 36.0–46.0)
Hemoglobin: 14.3 g/dL (ref 12.0–15.0)
MCHC: 33.3 g/dL (ref 30.0–36.0)
MCV: 93.7 fl (ref 78.0–100.0)
Platelets: 149 10*3/uL — ABNORMAL LOW (ref 150.0–400.0)
RBC: 4.59 Mil/uL (ref 3.87–5.11)
RDW: 13.7 % (ref 11.5–15.5)
WBC: 5.1 10*3/uL (ref 4.0–10.5)

## 2016-01-30 LAB — BASIC METABOLIC PANEL
BUN: 19 mg/dL (ref 6–23)
CALCIUM: 9.2 mg/dL (ref 8.4–10.5)
CO2: 31 mEq/L (ref 19–32)
CREATININE: 0.89 mg/dL (ref 0.40–1.20)
Chloride: 103 mEq/L (ref 96–112)
GFR: 62.99 mL/min (ref >60.00)
Glucose, Bld: 68 mg/dL — ABNORMAL LOW (ref 70–99)
Potassium: 4 mEq/L (ref 3.5–5.1)
Sodium: 140 mEq/L (ref 135–145)

## 2016-01-30 LAB — TSH: TSH: 5.22 u[IU]/mL — AB (ref 0.35–4.50)

## 2016-01-30 MED ORDER — CEPHALEXIN 500 MG PO CAPS
500.0000 mg | ORAL_CAPSULE | Freq: Four times a day (QID) | ORAL | 0 refills | Status: DC
Start: 2016-01-30 — End: 2016-02-01

## 2016-01-30 NOTE — Patient Instructions (Addendum)
BEFORE YOU LEAVE: -labs -follow up: 2 days  Start the antibiotic and take twice daily.  Elevate the leg above the waist for 30 minutes twice daily.  No pressure on this area.  Get the ultrasound.

## 2016-01-30 NOTE — Progress Notes (Signed)
HPI:  Acute visit for L LE edema and skin rash: -grandson staying with pt and assisting in her care reports started about 1 week ago but worse the last 1 day -swelling, redness, cracking of skin, weeping L LE below the knee -denies: fevers, chills, pus, known injury  ROS: See pertinent positives and negatives per HPI.  Past Medical History:  Diagnosis Date  . Cellulitis of left lower leg 11/30/2012  . Diverticulosis of colon   . Hyperlipidemia   . Hypertension   . Hypothyroidism   . ONYCHOMYCOSIS, TOENAILS 02/25/2008   Qualifier: Diagnosis of  By: Cato Mulligan MD, Bruce    . Parkinson disease (HCC)   . Undiagnosed cardiac murmurs 08/19/2014    Past Surgical History:  Procedure Laterality Date  . ABDOMINAL HYSTERECTOMY      Family History  Problem Relation Age of Onset  . Stroke Mother   . Cancer Father     prostate    Social History   Social History  . Marital status: Married    Spouse name: N/A  . Number of children: N/A  . Years of education: N/A   Social History Main Topics  . Smoking status: Never Smoker  . Smokeless tobacco: Never Used  . Alcohol use No  . Drug use: No  . Sexual activity: Not Currently   Other Topics Concern  . None   Social History Narrative  . None     Current Outpatient Prescriptions:  .  donepezil (ARICEPT) 5 MG tablet, TAKE 1 TABLET (5 MG TOTAL) BY MOUTH AT BEDTIME., Disp: 90 tablet, Rfl: 3 .  levothyroxine (SYNTHROID, LEVOTHROID) 50 MCG tablet, TAKE 1 TABLET (50 MCG TOTAL) BY MOUTH DAILY BEFORE BREAKFAST., Disp: 90 tablet, Rfl: 0 .  lisinopril (PRINIVIL,ZESTRIL) 40 MG tablet, TAKE 1 TABLET (40 MG TOTAL) BY MOUTH DAILY., Disp: 90 tablet, Rfl: 3 .  cephALEXin (KEFLEX) 500 MG capsule, Take 1 capsule (500 mg total) by mouth 4 (four) times daily., Disp: 10 capsule, Rfl: 0  EXAM:  Vitals:   01/30/16 1407  BP: 128/68  Pulse: 75  Temp: 98.2 F (36.8 C)    Body mass index is 21.73 kg/m.  GENERAL: vitals reviewed and listed above,  alert, oriented, appears well hydrated and in no acute distress  HEENT: atraumatic, conjunttiva clear, no obvious abnormalities on inspection of external nose and ears  NECK: no obvious masses on inspection  LUNGS: clear to auscultation bilaterally, no wheezes, rales or rhonchi, good air movement  CV: HRRR  SKIN: Swelling, redness, warmth of the entire L leg below the knee, with cracking of the skin on the entire anterior shin with some serous sanguinous weeping. The skin is tight and she does have tenderness, but it is difficult to assess tenderness over the deep veins given her dementia.  MS: moves all extremities without noticeable abnormality  PSYCH: the patient in wheelchair, noncommunicative, appears awake and in no apparent distress  ASSESSMENT AND PLAN:  Discussed the following assessment and plan:  Left leg swelling - Plan: VAS [place] LOWER EXTREMITY VENOUS (DVT)  Rash and nonspecific skin eruption  Cellulitis of left lower extremity - Plan: cephALEXin (KEFLEX) 500 MG capsule  Essential hypertension - Plan: Basic metabolic panel, CBC (no diff)  Other specified hypothyroidism - Plan: TSH  Dementia without behavioral disturbance, unspecified dementia type  Encounter for immunization - Plan: Flu vaccine HIGH DOSE PF  -most likely infectious with cellulitis - nonpurulent and opted to treat with Keflex with close follow-up in 2 days  to recheck - I will not be here, but I will discuss this case with the provider who will see her - Will obtain lower extremity duplex to rule out DVT, but this is felt to be less likely -advise elevation and showed grandson how to accomplish this - Labs today is not done in some time  - will renally dose abx for now -Patient advised to return or notify a doctor immediately if symptoms worsen or persist or new concerns arise.  Patient Instructions  BEFORE YOU LEAVE: -labs -follow up: 2 days  Start the antibiotic and take twice daily.  Elevate  the leg above the waist for 30 minutes twice daily.  No pressure on this area.  Get the ultrasound.    Kriste BasqueKIM, Kalin Kyler R., DO

## 2016-01-30 NOTE — Progress Notes (Signed)
Pre visit review using our clinic review tool, if applicable. No additional management support is needed unless otherwise documented below in the visit note. 

## 2016-02-01 ENCOUNTER — Encounter: Payer: Self-pay | Admitting: Adult Health

## 2016-02-01 ENCOUNTER — Ambulatory Visit (INDEPENDENT_AMBULATORY_CARE_PROVIDER_SITE_OTHER): Payer: Medicare Other | Admitting: Adult Health

## 2016-02-01 VITALS — BP 148/62 | Temp 98.1°F

## 2016-02-01 DIAGNOSIS — L03116 Cellulitis of left lower limb: Secondary | ICD-10-CM

## 2016-02-01 MED ORDER — CEPHALEXIN 500 MG PO CAPS: 500.0000 mg | ORAL_CAPSULE | Freq: Two times a day (BID) | ORAL | 0 refills | Status: DC

## 2016-02-01 NOTE — Patient Instructions (Signed)
It was great meeting you today   Cut the antibiotics back to twice per day. I am going to send in an additional 3 days.   Follow up if the infection has not resolved after that

## 2016-02-01 NOTE — Progress Notes (Signed)
Subjective:    Patient ID: Tammy Branch, female    DOB: March 20, 1921, 82 y.o.   MRN: [id number]  HPI  82 year old female, patient of Dr. Selena Batten who presents to the office today for two day follow up of cellulitis on left leg. She saw Dr. Selena Batten who had prescribed her keflex 500mg  BID, the patient had been taking this QID as those were the directions on the bottle.   The son in law, who is present at this exam endorses improvement in the rash over all. He reports that the redness has started to subside and there is no drainage.   She has not been febrile.   She has been elevating her legs.   Review of Systems  Cardiovascular: Positive for leg swelling.  Skin: Positive for color change, rash and wound.  All other systems reviewed and are negative.  Past Medical History:  Diagnosis Date  . Cellulitis of left lower leg 11/30/2012  . Diverticulosis of colon   . Hyperlipidemia   . Hypertension   . Hypothyroidism   . ONYCHOMYCOSIS, TOENAILS 02/25/2008   Qualifier: Diagnosis of  By: Cato Mulligan MD, Bruce    . Parkinson disease (HCC)   . Undiagnosed cardiac murmurs 08/19/2014    Social History   Social History  . Marital status: Married    Spouse name: N/A  . Number of children: N/A  . Years of education: N/A   Occupational History  . Not on file.   Social History Main Topics  . Smoking status: Never Smoker  . Smokeless tobacco: Never Used  . Alcohol use No  . Drug use: No  . Sexual activity: Not Currently   Other Topics Concern  . Not on file   Social History Narrative  . No narrative on file    Past Surgical History:  Procedure Laterality Date  . ABDOMINAL HYSTERECTOMY      Family History  Problem Relation Age of Onset  . Stroke Mother   . Cancer Father     prostate    Allergies  Allergen Reactions  . Citalopram Hydrobromide     REACTION: hallucinations    Current Outpatient Prescriptions on File Prior to Visit  Medication Sig Dispense Refill  . cephALEXin  (KEFLEX) 500 MG capsule Take 1 capsule (500 mg total) by mouth 4 (four) times daily. 10 capsule 0  . donepezil (ARICEPT) 5 MG tablet TAKE 1 TABLET (5 MG TOTAL) BY MOUTH AT BEDTIME. 90 tablet 3  . levothyroxine (SYNTHROID, LEVOTHROID) 50 MCG tablet TAKE 1 TABLET (50 MCG TOTAL) BY MOUTH DAILY BEFORE BREAKFAST. 90 tablet 0  . lisinopril (PRINIVIL,ZESTRIL) 40 MG tablet TAKE 1 TABLET (40 MG TOTAL) BY MOUTH DAILY. 90 tablet 3   No current facility-administered medications on file prior to visit.     BP (!) 148/62   Temp 98.1 F (36.7 C) (Oral)       Objective:   Physical Exam  Constitutional: She is oriented to person, place, and time. She appears well-developed and well-nourished. No distress.  Pulmonary/Chest: Breath sounds normal.  Neurological: She is alert and oriented to person, place, and time.  Skin: Skin is warm and dry. Rash noted. She is not diaphoretic. There is erythema.  There continues to be swelling, redness, warmth of the entire L leg below the knee, with cracking of the skin on the entire anterior shin without any weeping. The skin continues to be tight.   Nursing note and vitals reviewed.  Assessment & Plan:   1. Cellulitis of left lower extremity - Appears to be improving. I advised the son in law to start taking medication BID. Will call in an additional 3 days of Keflex.  - cephALEXin (KEFLEX) 500 MG capsule; Take 1 capsule (500 mg total) by mouth 2 (two) times daily.  Dispense: 6 capsule; Refill: 0 - Follow up with Dr. Selena BattenKim if not resolved by the end of antibiotic treatment or sooner if cellulitis worsens.   Shirline Freesory Wallace Gappa, NP

## 2016-02-16 ENCOUNTER — Other Ambulatory Visit: Payer: Self-pay | Admitting: Family Medicine

## 2016-03-09 ENCOUNTER — Emergency Department (HOSPITAL_COMMUNITY): Payer: Medicare Other

## 2016-03-09 ENCOUNTER — Inpatient Hospital Stay (HOSPITAL_COMMUNITY): Payer: Medicare Other

## 2016-03-09 ENCOUNTER — Encounter (HOSPITAL_COMMUNITY): Payer: Self-pay | Admitting: Emergency Medicine

## 2016-03-09 ENCOUNTER — Inpatient Hospital Stay (HOSPITAL_COMMUNITY)
Admission: EM | Admit: 2016-03-09 | Discharge: 2016-03-11 | DRG: 280 | Disposition: A | Discharge status: Home / Self Care | Payer: Medicare Other | Attending: Internal Medicine | Admitting: Internal Medicine

## 2016-03-09 DIAGNOSIS — K7689 Other specified diseases of liver: Secondary | ICD-10-CM | POA: Diagnosis not present

## 2016-03-09 DIAGNOSIS — F0390 Unspecified dementia without behavioral disturbance: Secondary | ICD-10-CM | POA: Diagnosis present

## 2016-03-09 DIAGNOSIS — I110 Hypertensive heart disease with heart failure: Secondary | ICD-10-CM | POA: Diagnosis present

## 2016-03-09 DIAGNOSIS — Z823 Family history of stroke: Secondary | ICD-10-CM | POA: Diagnosis not present

## 2016-03-09 DIAGNOSIS — Z90710 Acquired absence of both cervix and uterus: Secondary | ICD-10-CM | POA: Diagnosis not present

## 2016-03-09 DIAGNOSIS — I10 Essential (primary) hypertension: Secondary | ICD-10-CM

## 2016-03-09 DIAGNOSIS — I501 Left ventricular failure: Secondary | ICD-10-CM

## 2016-03-09 DIAGNOSIS — Z79899 Other long term (current) drug therapy: Secondary | ICD-10-CM | POA: Diagnosis not present

## 2016-03-09 DIAGNOSIS — I5031 Acute diastolic (congestive) heart failure: Secondary | ICD-10-CM | POA: Diagnosis present

## 2016-03-09 DIAGNOSIS — G309 Alzheimer's disease, unspecified: Secondary | ICD-10-CM | POA: Diagnosis present

## 2016-03-09 DIAGNOSIS — R079 Chest pain, unspecified: Secondary | ICD-10-CM

## 2016-03-09 DIAGNOSIS — I214 Non-ST elevation (NSTEMI) myocardial infarction: Secondary | ICD-10-CM | POA: Diagnosis not present

## 2016-03-09 DIAGNOSIS — I160 Hypertensive urgency: Secondary | ICD-10-CM | POA: Diagnosis present

## 2016-03-09 DIAGNOSIS — R0602 Shortness of breath: Secondary | ICD-10-CM | POA: Diagnosis not present

## 2016-03-09 DIAGNOSIS — F0280 Dementia in other diseases classified elsewhere without behavioral disturbance: Secondary | ICD-10-CM | POA: Diagnosis present

## 2016-03-09 DIAGNOSIS — G20 Parkinson's disease: Secondary | ICD-10-CM | POA: Diagnosis present

## 2016-03-09 DIAGNOSIS — Z66 Do not resuscitate: Secondary | ICD-10-CM | POA: Diagnosis present

## 2016-03-09 DIAGNOSIS — J810 Acute pulmonary edema: Secondary | ICD-10-CM

## 2016-03-09 DIAGNOSIS — J90 Pleural effusion, not elsewhere classified: Secondary | ICD-10-CM | POA: Diagnosis not present

## 2016-03-09 DIAGNOSIS — I5033 Acute on chronic diastolic (congestive) heart failure: Secondary | ICD-10-CM

## 2016-03-09 DIAGNOSIS — I480 Paroxysmal atrial fibrillation: Secondary | ICD-10-CM | POA: Diagnosis present

## 2016-03-09 DIAGNOSIS — Z888 Allergy status to other drugs, medicaments and biological substances status: Secondary | ICD-10-CM | POA: Diagnosis not present

## 2016-03-09 DIAGNOSIS — E876 Hypokalemia: Secondary | ICD-10-CM | POA: Diagnosis present

## 2016-03-09 DIAGNOSIS — E785 Hyperlipidemia, unspecified: Secondary | ICD-10-CM

## 2016-03-09 DIAGNOSIS — E039 Hypothyroidism, unspecified: Secondary | ICD-10-CM | POA: Diagnosis present

## 2016-03-09 HISTORY — DX: Unspecified dementia, unspecified severity, without behavioral disturbance, psychotic disturbance, mood disturbance, and anxiety: F03.90

## 2016-03-09 LAB — ECHOCARDIOGRAM COMPLETE
Height: 66 in
WEIGHTICAEL: 1806.01 [oz_av]

## 2016-03-09 LAB — BASIC METABOLIC PANEL
Anion gap: 9 (ref 5–15)
BUN: 22 mg/dL — AB (ref 6–20)
CO2: 27 mmol/L (ref 22–32)
CREATININE: 0.95 mg/dL (ref 0.44–1.00)
Calcium: 8.9 mg/dL (ref 8.9–10.3)
Chloride: 104 mmol/L (ref 101–111)
GFR, EST AFRICAN AMERICAN: 58 mL/min — AB (ref >60)
GFR, EST NON AFRICAN AMERICAN: 50 mL/min — AB (ref >60)
Glucose, Bld: 101 mg/dL — ABNORMAL HIGH (ref 65–99)
POTASSIUM: 3.4 mmol/L — AB (ref 3.5–5.1)
Sodium: 140 mmol/L (ref 135–145)

## 2016-03-09 LAB — APTT: APTT: 33 s (ref 24–36)

## 2016-03-09 LAB — CBC
HEMATOCRIT: 41.7 % (ref 36.0–46.0)
Hemoglobin: 12.7 g/dL (ref 12.0–15.0)
MCH: 29.3 pg (ref 26.0–34.0)
MCHC: 30.5 g/dL (ref 30.0–36.0)
MCV: 96.1 fL (ref 78.0–100.0)
PLATELETS: 154 10*3/uL (ref 150–400)
RBC: 4.34 MIL/uL (ref 3.87–5.11)
RDW: 13.6 % (ref 11.5–15.5)
WBC: 6.3 10*3/uL (ref 4.0–10.5)

## 2016-03-09 LAB — HEPARIN LEVEL (UNFRACTIONATED): HEPARIN UNFRACTIONATED: 0.28 [IU]/mL — AB (ref 0.30–0.70)

## 2016-03-09 LAB — LIPID PANEL
CHOL/HDL RATIO: 3.9 ratio
Cholesterol: 231 mg/dL — ABNORMAL HIGH (ref 0–200)
HDL: 60 mg/dL (ref >40)
LDL Cholesterol: 160 mg/dL — ABNORMAL HIGH (ref 0–99)
Triglycerides: 57 mg/dL (ref <150)
VLDL: 11 mg/dL (ref 0–40)

## 2016-03-09 LAB — PROTIME-INR
INR: 1.23
PROTHROMBIN TIME: 15.5 s — AB (ref 11.4–15.2)

## 2016-03-09 LAB — I-STAT TROPONIN, ED: Troponin i, poc: 6.1 ng/mL (ref 0.00–0.08)

## 2016-03-09 LAB — TROPONIN I
Troponin I: 5.37 ng/mL (ref <0.03)
Troponin I: 5.9 ng/mL (ref <0.03)
Troponin I: 5.72 ng/mL (ref <0.03)

## 2016-03-09 LAB — BRAIN NATRIURETIC PEPTIDE: B NATRIURETIC PEPTIDE 5: 1348.6 pg/mL — AB (ref 0.0–100.0)

## 2016-03-09 MED ORDER — MORPHINE SULFATE (PF) 2 MG/ML IV SOLN: 1.0000 mg | INTRAVENOUS | Status: DC | PRN

## 2016-03-09 MED ORDER — HYDRALAZINE HCL 20 MG/ML IJ SOLN
10.0000 mg | Freq: Four times a day (QID) | INTRAMUSCULAR | Status: DC | PRN
  Administered 2016-03-09 – 2016-03-11 (×2): 10 mg via INTRAVENOUS
  Filled 2016-03-09 (×2): qty 1

## 2016-03-09 MED ORDER — POTASSIUM CHLORIDE CRYS ER 20 MEQ PO TBCR
40.0000 meq | EXTENDED_RELEASE_TABLET | Freq: Once | ORAL | Status: AC
  Administered 2016-03-09: 40 meq via ORAL
  Filled 2016-03-09: qty 2

## 2016-03-09 MED ORDER — NITROGLYCERIN 0.4 MG SL SUBL: 0.4000 mg | SUBLINGUAL_TABLET | SUBLINGUAL | Status: DC | PRN

## 2016-03-09 MED ORDER — SODIUM CHLORIDE 0.9 % IV SOLN
10.0000 mL/h | INTRAVENOUS | Status: DC
Start: 2016-03-09 — End: 2016-03-11

## 2016-03-09 MED ORDER — METOPROLOL TARTRATE 25 MG PO TABS
12.5000 mg | ORAL_TABLET | Freq: Two times a day (BID) | ORAL | Status: DC
  Administered 2016-03-09: 12.5 mg via ORAL
  Filled 2016-03-09: qty 1

## 2016-03-09 MED ORDER — DILTIAZEM HCL 100 MG IV SOLR
5.0000 mg/h | INTRAVENOUS | Status: DC
  Filled 2016-03-09: qty 100

## 2016-03-09 MED ORDER — TRAMADOL HCL 50 MG PO TABS: 25.0000 mg | ORAL_TABLET | Freq: Four times a day (QID) | ORAL | Status: DC | PRN

## 2016-03-09 MED ORDER — ASPIRIN 81 MG PO CHEW: 81.0000 mg | CHEWABLE_TABLET | Freq: Every day | ORAL | Status: DC

## 2016-03-09 MED ORDER — SODIUM CHLORIDE 0.9 % IV SOLN: 250.0000 mL | INTRAVENOUS | Status: DC | PRN

## 2016-03-09 MED ORDER — SODIUM CHLORIDE 0.9% FLUSH: 3.0000 mL | INTRAVENOUS | Status: DC | PRN

## 2016-03-09 MED ORDER — HEPARIN BOLUS VIA INFUSION
2000.0000 [IU] | Freq: Once | INTRAVENOUS | Status: AC
  Administered 2016-03-09: 2000 [IU] via INTRAVENOUS
  Filled 2016-03-09: qty 2000

## 2016-03-09 MED ORDER — HEPARIN (PORCINE) IN NACL 100-0.45 UNIT/ML-% IJ SOLN
650.0000 [IU]/h | INTRAMUSCULAR | Status: DC
  Administered 2016-03-09: 600 [IU]/h via INTRAVENOUS
  Administered 2016-03-10: 650 [IU]/h via INTRAVENOUS
  Filled 2016-03-09 (×2): qty 250

## 2016-03-09 MED ORDER — LISINOPRIL 20 MG PO TABS
40.0000 mg | ORAL_TABLET | Freq: Every day | ORAL | Status: DC
  Administered 2016-03-09 – 2016-03-11 (×3): 40 mg via ORAL
  Filled 2016-03-09 (×3): qty 2

## 2016-03-09 MED ORDER — ACETAMINOPHEN 325 MG PO TABS
650.0000 mg | ORAL_TABLET | ORAL | Status: DC | PRN
  Administered 2016-03-10: 650 mg via ORAL
  Filled 2016-03-09: qty 2

## 2016-03-09 MED ORDER — ONDANSETRON HCL 4 MG/2ML IJ SOLN: 4.0000 mg | Freq: Four times a day (QID) | INTRAMUSCULAR | Status: DC | PRN

## 2016-03-09 MED ORDER — IOPAMIDOL (ISOVUE-370) INJECTION 76%
100.0000 mL | Freq: Once | INTRAVENOUS | Status: AC | PRN
  Administered 2016-03-09: 100 mL via INTRAVENOUS

## 2016-03-09 MED ORDER — ATORVASTATIN CALCIUM 40 MG PO TABS
40.0000 mg | ORAL_TABLET | Freq: Every day | ORAL | Status: DC
  Administered 2016-03-09 – 2016-03-11 (×3): 40 mg via ORAL
  Filled 2016-03-09 (×3): qty 1

## 2016-03-09 MED ORDER — FUROSEMIDE 10 MG/ML IJ SOLN
20.0000 mg | Freq: Once | INTRAMUSCULAR | Status: AC
  Administered 2016-03-09: 20 mg via INTRAVENOUS
  Filled 2016-03-09: qty 4

## 2016-03-09 MED ORDER — ISOSORBIDE MONONITRATE ER 30 MG PO TB24
30.0000 mg | ORAL_TABLET | Freq: Every day | ORAL | Status: DC
  Administered 2016-03-09: 30 mg via ORAL
  Filled 2016-03-09: qty 1

## 2016-03-09 MED ORDER — DILTIAZEM LOAD VIA INFUSION
20.0000 mg | Freq: Once | INTRAVENOUS | Status: DC
  Filled 2016-03-09: qty 20

## 2016-03-09 MED ORDER — HEPARIN SODIUM (PORCINE) 5000 UNIT/ML IJ SOLN: 60.0000 [IU]/kg | INTRAMUSCULAR | Status: DC

## 2016-03-09 MED ORDER — ASPIRIN 81 MG PO CHEW
81.0000 mg | CHEWABLE_TABLET | Freq: Once | ORAL | Status: AC
  Administered 2016-03-09: 81 mg via ORAL
  Filled 2016-03-09: qty 1

## 2016-03-09 MED ORDER — SODIUM CHLORIDE 0.9% FLUSH
3.0000 mL | Freq: Two times a day (BID) | INTRAVENOUS | Status: DC
  Administered 2016-03-09 – 2016-03-11 (×4): 3 mL via INTRAVENOUS

## 2016-03-09 MED ORDER — DONEPEZIL HCL 5 MG PO TABS
5.0000 mg | ORAL_TABLET | Freq: Every day | ORAL | Status: DC
  Administered 2016-03-09 – 2016-03-10 (×2): 5 mg via ORAL
  Filled 2016-03-09 (×3): qty 1

## 2016-03-09 MED ORDER — METOPROLOL TARTRATE 5 MG/5ML IV SOLN
INTRAVENOUS | Status: AC
  Administered 2016-03-09: 10 mg
  Filled 2016-03-09: qty 10

## 2016-03-09 MED ORDER — IOPAMIDOL (ISOVUE-370) INJECTION 76%
INTRAVENOUS | Status: AC
  Filled 2016-03-09: qty 100

## 2016-03-09 MED ORDER — NITROGLYCERIN 2 % TD OINT
1.0000 [in_us] | TOPICAL_OINTMENT | Freq: Once | TRANSDERMAL | Status: AC
  Administered 2016-03-09: 1 [in_us] via TOPICAL
  Filled 2016-03-09: qty 1

## 2016-03-09 MED ORDER — METOPROLOL TARTRATE 5 MG/5ML IV SOLN: 10.0000 mg | Freq: Once | INTRAVENOUS | Status: DC

## 2016-03-09 MED ORDER — LEVOTHYROXINE SODIUM 50 MCG PO TABS
50.0000 ug | ORAL_TABLET | Freq: Every day | ORAL | Status: DC
  Administered 2016-03-10 – 2016-03-11 (×2): 50 ug via ORAL
  Filled 2016-03-09 (×2): qty 1

## 2016-03-09 MED ORDER — ASPIRIN EC 81 MG PO TBEC
81.0000 mg | DELAYED_RELEASE_TABLET | Freq: Every day | ORAL | Status: DC
  Administered 2016-03-10 – 2016-03-11 (×2): 81 mg via ORAL
  Filled 2016-03-09 (×2): qty 1

## 2016-03-09 NOTE — Progress Notes (Signed)
Received call from central telemetry that patient's HR was in the 140s. Patient lying in the bed asleep and HR dropped to the 90s.  Soon after, HR increased to 150s all the way up to 223.  EKG showed A.fib with RVR. MD paged.  Rapid response called d/t high fluctuations in HR.  New orders obtained for IV metoprolol.  Will monitor and admin second dose of metoprolol if no response to first dose.  Will continue to monitor.

## 2016-03-09 NOTE — ED Triage Notes (Signed)
Family states pt woke up earlier this morning c/o pain in her chest and pain in both arms  Family states they gave her some ibuprofen and she went back to bed then got back up a couple hours later c/o same  Family states this is the third time in the past couple of days she has c/o chest pain  Pt is nonverbal in triage

## 2016-03-09 NOTE — Progress Notes (Signed)
Brief Pharmacy Note re: IV Heparin  See full note from Azzie Glatter, PharmD from earlier today.  O: heparin level 0.28 on 600 units/hr (goal 0.3-0.7) - heparin infusion off [phone] so ~5hr   heparin level      No bleeding issues reported by RN  A/P:    Heparin level slightly below goal range although not drawn at steady state.    Increase heparin infusion to 650 units/hr  Re-check 8hr heparin level  Junita Push, PharmD, BCPS Pager: [phone] 03/09/2016@5 :03 PM

## 2016-03-09 NOTE — Progress Notes (Signed)
ANTICOAGULATION CONSULT NOTE - Initial Consult  Pharmacy Consult for IV heparin Indication: chest pain/ACS  Allergies  Allergen Reactions  . Citalopram Hydrobromide     REACTION: hallucinations    Patient Measurements:   Heparin Dosing Weight: 53.9 kg  Vital Signs: Temp: 97.4 F (36.3 C) (12/29 0614) Temp Source: Oral (12/29 0614) BP: 196/64 (12/29 0635) Pulse Rate: 76 (12/29 0635)  Labs:  Recent Labs  03/09/16 0627  HGB 12.7  HCT 41.7  PLT 154  CREATININE 0.95    CrCl cannot be calculated (Unknown ideal weight.).   Medical History: Past Medical History:  Diagnosis Date  . Cellulitis of left lower leg 11/30/2012  . Diverticulosis of colon   . Hyperlipidemia   . Hypertension   . Hypothyroidism   . ONYCHOMYCOSIS, TOENAILS 02/25/2008   Qualifier: Diagnosis of  By: Cato Mulligan MD, Bruce    . Parkinson disease (HCC)   . Undiagnosed cardiac murmurs 08/19/2014    Medications:  Scheduled:  . nitroGLYCERIN  1 inch Topical Once   Infusions:  . sodium chloride      Assessment: 82 yo female presents to ER with CC chest pain and radiating pain in both arms. To start IV heparin per pharmacy dosing for NSTEMI. Baseline CBC good  Goal of Therapy:  Heparin level 0.3-0.7 units/ml Monitor platelets by anticoagulation protocol: Yes   Plan:  1) IV heparin 2000 unit bolus then 2) IV heparin infusion rate of 600 units/hr 3) Check heparin level 8 hours after start of IV heparin 4) Daily CBC and heparin level   Hessie Knows, PharmD, BCPS Pager [phone] 03/09/2016 7:14 AM

## 2016-03-09 NOTE — Progress Notes (Signed)
  Echocardiogram 2D Echocardiogram has been performed.  Tammy Branch M 03/09/2016, 2:46 PM

## 2016-03-09 NOTE — ED Provider Notes (Signed)
[facility]-EMERGENCY DEPT Provider Note   CSN: [id number] Arrival date & time: 03/09/16  0601  CODE STATUS: DNR/DNI POA: SON WHO IS AT BEDSIDE. PCP: Kriste Basque     History   Chief Complaint Chief Complaint  Patient presents with  . Chest Pain    HPI Tammy Branch is a 82 y.o. female 82 year old who presents emergency Department with chief complaint of chest pain radiating to both arms. She is a past medical history of dementia, essential hypertension. History is given by the patient and her son. She has had intermittent severe chest pain radiating to both her arms over the past 3 days. This morning she awoke with severe chest pain. She denies shortness of breath. The patient took a day and ibuprofen yesterday, which seemed to ease her pain and allow her to sleep. Cardiac risk factors include hypertension. She denies a history of high cholesterol or smoking.  HPI  Past Medical History:  Diagnosis Date  . Cellulitis of left lower leg 11/30/2012  . Dementia   . Diverticulosis of colon   . Hyperlipidemia   . Hypertension   . Hypothyroidism   . ONYCHOMYCOSIS, TOENAILS 02/25/2008   Qualifier: Diagnosis of  By: Cato Mulligan MD, Bruce    . Parkinson disease (HCC)   . Undiagnosed cardiac murmurs    a. 06/2008 Echo: EF nl, mild LVH, mild AI, atrial septal aneurysm, mild PR/TR, mildly increased PASP.    Patient Active Problem List   Diagnosis Date Noted  . NSTEMI (non-ST elevated myocardial infarction) (HCC) 03/09/2016  . Hypertension   . Undiagnosed cardiac murmurs 08/19/2014  . Dementia 05/27/2014  . PARKINSON'S DISEASE 06/03/2009  . Hypothyroidism 12/12/2005  . HYPERLIPIDEMIA 12/12/2005  . Essential hypertension 12/12/2005    Past Surgical History:  Procedure Laterality Date  . ABDOMINAL HYSTERECTOMY      OB History    No data available       Home Medications    Prior to Admission medications   Medication Sig Start Date End Date Taking? Authorizing Provider    donepezil (ARICEPT) 5 MG tablet TAKE 1 TABLET (5 MG TOTAL) BY MOUTH AT BEDTIME. 10/31/15  Yes [name], DO  ibuprofen (ADVIL,MOTRIN) 200 MG tablet Take 400 mg by mouth every 6 (six) hours as needed for moderate pain.   Yes Historical Provider, MD  levothyroxine (SYNTHROID, LEVOTHROID) 50 MCG tablet TAKE 1 TABLET (50 MCG TOTAL) BY MOUTH DAILY BEFORE BREAKFAST. 02/16/16  Yes [name], DO  lisinopril (PRINIVIL,ZESTRIL) 40 MG tablet TAKE 1 TABLET (40 MG TOTAL) BY MOUTH DAILY. 10/24/15  Yes [name], DO  cephALEXin (KEFLEX) 500 MG capsule Take 1 capsule (500 mg total) by mouth 2 (two) times daily. Patient not taking: Reported on 03/09/2016 02/01/16   Shirline Frees, NP    Family History Family History  Problem Relation Age of Onset  . Stroke Mother   . Cancer Father     prostate    Social History Social History  Substance Use Topics  . Smoking status: Never Smoker  . Smokeless tobacco: Never Used  . Alcohol use No     Allergies   Citalopram hydrobromide   Review of Systems Review of Systems Ten systems reviewed and are negative for acute change, except as noted in the HPI.    Physical Exam Updated Vital Signs BP 190/77   Pulse 101   Temp 97.4 F (36.3 C) (Oral)   Resp 19   Ht 5\' 4"  (1.626 m)   Wt  54.4 kg   SpO2 96%   BMI 20.60 kg/m   Physical Exam  Constitutional: She is oriented to person, place, and time. She appears well-developed and well-nourished.  HENT:  Head: Normocephalic and atraumatic.  Eyes: EOM are normal. Pupils are equal, round, and reactive to light.  Neck: Normal range of motion. Neck supple. No JVD present.  Cardiovascular: Normal rate, regular rhythm and intact distal pulses.   No murmur heard. No loud murmurs Equal upper extremity pulses  Pulmonary/Chest: Effort normal and breath sounds normal.  Coarse crackles in the bases  Abdominal: Soft. She exhibits no distension. There is no tenderness.  Neurological: She is alert and oriented  to person, place, and time.  Skin: Skin is warm and dry. Capillary refill takes less than 2 seconds. Hands are warm and well perfused.  Nursing note and vitals reviewed.    ED Treatments / Results  Labs (all labs ordered are listed, but only abnormal results are displayed) Labs Reviewed  BASIC METABOLIC PANEL - Abnormal; Notable for the following:       Result Value   Potassium 3.4 (*)    Glucose, Bld 101 (*)    BUN 22 (*)    GFR calc non Af Amer 50 (*)    GFR calc Af Amer 58 (*)    All other components within normal limits  TROPONIN I - Abnormal; Notable for the following:    Troponin I 5.37 (*)    All other components within normal limits  PROTIME-INR - Abnormal; Notable for the following:    Prothrombin Time 15.5 (*)    All other components within normal limits  BRAIN NATRIURETIC PEPTIDE - Abnormal; Notable for the following:    B Natriuretic Peptide 1,348.6 (*)    All other components within normal limits  I-STAT TROPOININ, ED - Abnormal; Notable for the following:    Troponin i, poc 6.10 (*)    All other components within normal limits  CBC  APTT  LIPID PANEL  TROPONIN I  TROPONIN I  HEPARIN LEVEL (UNFRACTIONATED)    EKG  EKG Interpretation  Date/Time:  Friday March 09 2016 06:12:05 EST Ventricular Rate:  76 PR Interval:    QRS Duration: 84 QT Interval:  392 QTC Calculation: 441 R Axis:   -40 Text Interpretation:  Sinus rhythm Ventricular premature complex Left anterior fascicular block Left ventricular hypertrophy Anterior Q waves, possibly due to LVH - not new Nonspecific T abnormalities, lateral leads - not new avR ST elevation  -not new Confirmed by Rhunette Croft, MD, Janey Genta [phone]) on 03/09/2016 6:46:21 AM       Radiology Dg Chest 2 View  Result Date: 03/09/2016 CLINICAL DATA:  Chest pain and BILATERAL arm pain. EXAM: CHEST  2 VIEW COMPARISON:  11/29/2012. FINDINGS: The heart is enlarged. Calcified tortuous aorta. BILATERAL pulmonary opacities favored  to represent early pulmonary edema. Elevated LEFT hemidiaphragm, likely subsegmental atelectasis LEFT base. Definite lobar consolidation not established. Small LEFT effusion. No pneumothorax. No osseous findings. IMPRESSION: Cardiomegaly, question early pulmonary edema. Worsening aeration compared with priors. Retrocardiac density favored to represent elevated LEFT hemidiaphragm due to LEFT lower lobe subsegmental volume loss. Electronically Signed   By: Elsie Stain M.D.   On: 03/09/2016 06:53   Ct Angio Chest/abd/pel For Dissection W And/or W/wo  Result Date: 03/09/2016 CLINICAL DATA:  Upper chest pain, bilateral arm pain for 3 days. Cough, shortness of breath. EXAM: CT ANGIOGRAPHY CHEST, ABDOMEN AND PELVIS TECHNIQUE: Multidetector CT imaging through the chest, abdomen and  pelvis was performed using the standard protocol during bolus administration of intravenous contrast. Multiplanar reconstructed images and MIPs were obtained and reviewed to evaluate the vascular anatomy. CONTRAST:  100 cc Isovue 370 IV COMPARISON:  None. FINDINGS: CTA CHEST FINDINGS Cardiovascular: Slight dilatation of the ascending thoracic aorta, 4 cm maximally. Calcifications in the aortic arch and descending thoracic aorta. No dissection. There is cardiomegaly. Mediastinum/Nodes: No mediastinal, hilar, or axillary adenopathy. Lungs/Pleura: Small left pleural effusion. Vascular congestion. Ground-glass opacities throughout the lungs could reflect early interstitial edema. No confluent opacities. Musculoskeletal: No acute bony abnormality or focal bone lesion. Review of the MIP images confirms the above findings. CTA ABDOMEN AND PELVIS FINDINGS VASCULAR Aorta: Diffuse calcifications throughout the aorta and iliac vessels. No aneurysm or dissection. Celiac: Patent SMA: Origin is heavily calcified with mild narrowing, likely not hemodynamically significant. Renals: Tiny accessory renal artery to the left lower pole. Renal arteries are  patent. IMA: Patent Inflow: Patent Veins: Grossly unremarkable. Review of the MIP images confirms the above findings. NON-VASCULAR Hepatobiliary: Numerous hepatic cysts. No biliary ductal dilatation. Gallbladder grossly unremarkable. Pancreas: No focal abnormality or ductal dilatation. Spleen: No focal abnormality.  Normal size. Adrenals/Urinary Tract: No hydronephrosis. No renal or adrenal mass. Urinary bladder unremarkable. Stomach/Bowel: Moderate stool burden throughout the colon. Scattered sigmoid diverticulosis. No active diverticulitis. No evidence of bowel obstruction. Lymphatic: No adenopathy. Reproductive: Prior hysterectomy.  No adnexal mass. Other: Large left inguinal hernia containing small bowel loops. No obstruction. No free fluid or free air. Musculoskeletal: Severe chronic appearing compression fracture at L1 with kyphosis. Review of the MIP images confirms the above findings. IMPRESSION: No evidence of aortic dissection. Slight aneurysmal dilatation of the ascending thoracic aorta, 4 cm. Aortic atherosclerosis. Recommend annual imaging followup by CTA or MRA. This recommendation follows 2010 ACCF/AHA/AATS/ACR/ASA/SCA/SCAI/SIR/STS/SVM Guidelines for the Diagnosis and Management of Patients with Thoracic Aortic Disease. Circulation. 2010; 121: [id number] Cardiomegaly with vascular congestion. Ground-glass opacities in the lungs could reflect early interstitial edema. Small left pleural effusion. Numerous hepatic cysts. Large left inguinal hernia containing small bowel loops. No obstruction. Electronically Signed   By: Charlett NoseKevin  Dover M.D.   On: 03/09/2016 08:28    Procedures Procedures (including critical care time)  Medications Ordered in ED Medications  0.9 %  sodium chloride infusion (0 mL/hr Intravenous Hold 03/09/16 0753)  heparin ADULT infusion 100 units/mL (25000 units/28750mL sodium chloride 0.45%) (0 Units/hr Intravenous Paused 03/09/16 0751)  hydrALAZINE (APRESOLINE) injection 10 mg (10  mg Intravenous Given 03/09/16 0839)  aspirin chewable tablet 81 mg (not administered)  atorvastatin (LIPITOR) tablet 40 mg (not administered)  isosorbide mononitrate (IMDUR) 24 hr tablet 30 mg (not administered)  furosemide (LASIX) injection 20 mg (not administered)  nitroGLYCERIN (NITROGLYN) 2 % ointment 1 inch (1 inch Topical Given 03/09/16 0726)  heparin bolus via infusion 2,000 Units (2,000 Units Intravenous Bolus from Bag 03/09/16 0736)  iopamidol (ISOVUE-370) 76 % injection 100 mL (100 mLs Intravenous Contrast Given 03/09/16 0806)     Initial Impression / Assessment and Plan / ED Course  I have reviewed the triage vital signs and the nursing notes.  Pertinent labs & imaging results that were available during my care of the patient were reviewed by me and considered in my medical decision making (see chart for details).  Clinical Course as of Mar 09 909  Fri Mar 09, 2016  [id number] Patient with elevated troponin.  The patient is noted to be hypertensive. Patient tells me that she is having pain, but denies pain to  Dr. Vivia Ewing. 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  Troponin i, poc: (!!) 6.10 [AH]  0823 BNP elevated with evidence of infiltrate on the CXR B Natriuretic Peptide: (!) 1,348.6 [AH]    Clinical Course User Index [AH] Arthor Captain, PA-C    Patient with CTA - not yet read- but no visualized dissection. Cardiology is consulting on the patient presently. Dr. Anne Fu consulted and does not feel that catheterization is a good option given her age and code status and will treat medically.   Final Clinical Impressions(s) / ED Diagnoses   Final diagnoses:  NSTEMI (non-ST elevated myocardial infarction) (HCC)  Acute pulmonary edema (HCC)  Hypertension, unspecified type  Pleural effusion    Patient with NSTEMI- Will be admitted by Dr. Irene Limbo. Patient stable throughout visit.  New Prescriptions New Prescriptions   No  medications on file     Arthor Captain, PA-C 03/09/16 0910    Derwood Kaplan, MD 03/12/16 [phone]

## 2016-03-09 NOTE — Consult Note (Signed)
Cardiology Consult    Patient ID: Tammy BlendGladys S Izzo MRN: [id number], DOB/AGE: 04/11/1921   Admit date: 03/09/2016 Date of Consult: 03/09/2016  Primary Physician: Terressa KoyanagiKIM, HANNAH R., DO Primary Cardiologist: New Requesting Provider: [facility]  Patient Profile    82 y/o ? with a h/o dementia, HTN, HL, and hypothyroidism, who [facility] AM with c/p and troponin elevation.   Past Medical History   Past Medical History:  Diagnosis Date  . Cellulitis of left lower leg 11/30/2012  . Dementia   . Diverticulosis of colon   . Hyperlipidemia   . Hypertension   . Hypothyroidism   . ONYCHOMYCOSIS, TOENAILS 02/25/2008   Qualifier: Diagnosis of  By: Cato MulliganSwords MD, Bruce    . Parkinson disease (HCC)   . Undiagnosed cardiac murmurs    a. 06/2008 Echo: EF nl, mild LVH, mild AI, atrial septal aneurysm, mild PR/TR, mildly increased PASP.    Past Surgical History:  Procedure Laterality Date  . ABDOMINAL HYSTERECTOMY       Allergies  Allergies  Allergen Reactions  . Citalopram Hydrobromide     REACTION: hallucinations    History of Present Illness    82 y/o ? w/o prior cardiac hx.  She does have a long h/o HTN, HL, hypothyroidism, parkinsons, and dementia.  She lives with family and per family, she mostly stays in her bedroom - no TV/radio.  Very little social interaction.  Pt unable to provide accurate history - says she hasn't had any pain today or recently.  Family however, points out that she has been having intermittent chest discomfort for several days, which they thought was most likely indigestion.  This AM around 1:30, she awoke with more intense pain and [facility]  Here, ECG was non-acute however, initial troponin was elevated @ 5.37 (poc 6.10).  BP has been markedly elevated 190's to 200's.  CTA neg for dissection.  She currently denies chest pain.  She is a DNR and family wishes for conservative measures.  Home Medications    Prior to  Admission medications   Medication Sig Start Date End Date Taking? Authorizing Provider  donepezil (ARICEPT) 5 MG tablet TAKE 1 TABLET (5 MG TOTAL) BY MOUTH AT BEDTIME. 10/31/15  Yes Terressa KoyanagiHannah R Kim, DO  ibuprofen (ADVIL,MOTRIN) 200 MG tablet Take 400 mg by mouth every 6 (six) hours as needed for moderate pain.   Yes Historical Provider, MD  levothyroxine (SYNTHROID, LEVOTHROID) 50 MCG tablet TAKE 1 TABLET (50 MCG TOTAL) BY MOUTH DAILY BEFORE BREAKFAST. 02/16/16  Yes Terressa KoyanagiHannah R Kim, DO  lisinopril (PRINIVIL,ZESTRIL) 40 MG tablet TAKE 1 TABLET (40 MG TOTAL) BY MOUTH DAILY. 10/24/15  Yes Terressa KoyanagiHannah R Kim, DO  cephALEXin (KEFLEX) 500 MG capsule Take 1 capsule (500 mg total) by mouth 2 (two) times daily. Patient not taking: Reported on 03/09/2016 02/01/16   Shirline Freesory Nafziger, NP      Family History   **obtained from family members in room - dementia limits pts ability to provide history. Family History  Problem Relation Age of Onset  . Stroke Mother   . Cancer Father     prostate    Social History   **obtained from family members in room - dementia limits pts ability to provide history. Social History   Social History  . Marital status: Married    Spouse name: N/A  . Number of children: N/A  . Years of education: N/A   Occupational History  .  Not on file.   Social History Main Topics  . Smoking status: Never Smoker  . Smokeless tobacco: Never Used  . Alcohol use No  . Drug use: No  . Sexual activity: Not Currently   Other Topics Concern  . Not on file   Social History Narrative   Lives locally with family.  Sedentary.     Review of Systems   **obtained from family members in room - dementia limits pts ability to provide history. General:  No chills, fever, night sweats or weight changes.  Cardiovascular:   +++ intermittent chest pain over the past few days, no dyspnea on exertion, edema, orthopnea, palpitations, paroxysmal nocturnal dyspnea. Dermatological: No rash,  lesions/masses Respiratory: No cough, dyspnea Urologic: No hematuria, dysuria Abdominal:   No nausea, vomiting, diarrhea, bright red blood per rectum, melena, or hematemesis Neurologic:  No visual changes, wkns, changes in mental status. Pt says she hurts all over, all of the time. All other systems reviewed and are otherwise negative except as noted above.  Physical Exam    Blood pressure 198/71, pulse 74, temperature 97.4 F (36.3 C), temperature source Oral, resp. rate 15, height 5\' 4"  (1.626 m), weight 120 lb (54.4 kg), SpO2 98 %.  General: Pleasant, NAD Psych: Flat affect. Neuro: Disoriented to place/time. Moves all extremities spontaneously. HEENT: Normal  Neck: Supple without bruits or JVD. Lungs:  Resp regular and unlabored, diminished breath sounds bilat - poor effort. Heart: RRR no s3, s4, or murmurs. Abdomen: Soft, non-tender, non-distended, BS + x 4.  Extremities: No clubbing, cyanosis.  Left lower leg is mildly erythematous with trace ankle edema. DP/PT/Radials 1+ and equal bilaterally.  Labs    Troponin [facility] Test)  Recent Labs  03/09/16 0633  TROPIPOC 6.10*    Recent Labs  03/09/16 0627  TROPONINI 5.37*   Lab Results  Component Value Date   WBC 6.3 03/09/2016   HGB 12.7 03/09/2016   HCT 41.7 03/09/2016   MCV 96.1 03/09/2016   PLT 154 03/09/2016     Recent Labs Lab 03/09/16 0627  NA 140  K 3.4*  CL 104  CO2 27  BUN 22*  CREATININE 0.95  CALCIUM 8.9  GLUCOSE 101*     Radiology Studies    Dg Chest 2 View  Result Date: 03/09/2016 CLINICAL DATA:  Chest pain and BILATERAL arm pain. EXAM: CHEST  2 VIEW COMPARISON:  11/29/2012. FINDINGS: The heart is enlarged. Calcified tortuous aorta. BILATERAL pulmonary opacities favored to represent early pulmonary edema. Elevated LEFT hemidiaphragm, likely subsegmental atelectasis LEFT base. Definite lobar consolidation not established. Small LEFT effusion. No pneumothorax. No osseous findings.  IMPRESSION: Cardiomegaly, question early pulmonary edema. Worsening aeration compared with priors. Retrocardiac density favored to represent elevated LEFT hemidiaphragm due to LEFT lower lobe subsegmental volume loss. Electronically Signed   By: Elsie Stain M.D.   On: 03/09/2016 06:53   Ct Angio Chest/abd/pel For Dissection W And/or W/wo  Result Date: 03/09/2016 CLINICAL DATA:  Upper chest pain, bilateral arm pain for 3 days. Cough, shortness of breath. EXAM: CT ANGIOGRAPHY CHEST, ABDOMEN AND PELVIS TECHNIQUE: Multidetector CT imaging through the chest, abdomen and pelvis was performed using the standard protocol during bolus administration of intravenous contrast. Multiplanar reconstructed images and MIPs were obtained and reviewed to evaluate the vascular anatomy. CONTRAST:  100 cc Isovue 370 IV COMPARISON:  None. FINDINGS: CTA CHEST FINDINGS Cardiovascular: Slight dilatation of the ascending thoracic aorta, 4 cm maximally. Calcifications in the aortic arch and descending thoracic  aorta. No dissection. There is cardiomegaly. Mediastinum/Nodes: No mediastinal, hilar, or axillary adenopathy. Lungs/Pleura: Small left pleural effusion. Vascular congestion. Ground-glass opacities throughout the lungs could reflect early interstitial edema. No confluent opacities. Musculoskeletal: No acute bony abnormality or focal bone lesion. Review of the MIP images confirms the above findings. CTA ABDOMEN AND PELVIS FINDINGS VASCULAR Aorta: Diffuse calcifications throughout the aorta and iliac vessels. No aneurysm or dissection. Celiac: Patent SMA: Origin is heavily calcified with mild narrowing, likely not hemodynamically significant. Renals: Tiny accessory renal artery to the left lower pole. Renal arteries are patent. IMA: Patent Inflow: Patent Veins: Grossly unremarkable. Review of the MIP images confirms the above findings. NON-VASCULAR Hepatobiliary: Numerous hepatic cysts. No biliary ductal dilatation. Gallbladder  grossly unremarkable. Pancreas: No focal abnormality or ductal dilatation. Spleen: No focal abnormality.  Normal size. Adrenals/Urinary Tract: No hydronephrosis. No renal or adrenal mass. Urinary bladder unremarkable. Stomach/Bowel: Moderate stool burden throughout the colon. Scattered sigmoid diverticulosis. No active diverticulitis. No evidence of bowel obstruction. Lymphatic: No adenopathy. Reproductive: Prior hysterectomy.  No adnexal mass. Other: Large left inguinal hernia containing small bowel loops. No obstruction. No free fluid or free air. Musculoskeletal: Severe chronic appearing compression fracture at L1 with kyphosis. Review of the MIP images confirms the above findings. IMPRESSION: No evidence of aortic dissection. Slight aneurysmal dilatation of the ascending thoracic aorta, 4 cm. Aortic atherosclerosis. Recommend annual imaging followup by CTA or MRA. This recommendation follows 2010 ACCF/AHA/AATS/ACR/ASA/SCA/SCAI/SIR/STS/SVM Guidelines for the Diagnosis and Management of Patients with Thoracic Aortic Disease. Circulation. 2010; 121: U235-T614 Cardiomegaly with vascular congestion. Ground-glass opacities in the lungs could reflect early interstitial edema. Small left pleural effusion. Numerous hepatic cysts. Large left inguinal hernia containing small bowel loops. No obstruction. Electronically Signed   By: Charlett Nose M.D.   On: 03/09/2016 08:28    ECG & Cardiac Imaging    RSR, 67, PAC, LAD, LAFB, LAE, LVH w/ repol abnormalities, poor R progression  Assessment & Plan    1.  NSTEMI:  Pt presented to the [facility] ED on the morning of 12/29 after awakening @ ~ 1:30 AM with chest pain.  Per family she has been having intermittent chest discomfort over the past few days, which they thought was likely indigestion.  Troponin in the ED is elevated @ 5.37.  ECG non-acute.  CTA neg for dissection.  BP markedly elevated.  Pt also has a h/o dementia and family is in favor of conservative mgmt for NSTEMI.   Heparin has been ordered and would plan to continue for up to 48 hrs.  Add ASA 81 and statin.  HR's trending in low 60's, thus I don't think she'd tolerate  blocker therapy.  Cont lisinopril.  Add long acting nitrate.  Consider adding plavix, though with advanced age, bleeding risk may outweigh benefits.  2.  Hypertensive Urgency:  BP trending in 190's to 200's.  She is on lisinopril 40 @ home - resume.  I will add prn hydralazine and long acting nitrate.  Will likely need additional agent.  With relative bradycardia, I don't think she'll tolerate a  blocker.  Consider amlodipine.  3.  Acute CHF:  Diminished breath sounds with evidence of edema on cxr.  BNP 1348.  Will give one dose of IV lasix.  Check echo.  Resume acei/treat bp.  Signed, Nicolasa Ducking, NP 03/09/2016, 8:38 AM

## 2016-03-09 NOTE — ED Notes (Signed)
Pt in CT.

## 2016-03-09 NOTE — H&P (Addendum)
History and Physical  Tammy Branch:[id number] DOB: Nov 29, 1921 DOA: 03/09/2016  PCP: [name]., DO  Patient coming from: home (grandson assists with all ADLs)  Chief Complaint: chest pain  HPI:  89yow PMH dementia, HTN, no cardiac history; presented with chest pain. Troponin 5.37 c/w NSTEMI. Hypertensive, but otherwise stable and apparently pain free. Plan conservative medical management with heparin, etc.  Patient responses inappropriate to direct questioning. When asked, does not endorse or deny symptoms such as pain or SOB. Only states "I feel like I am dying". Cannot ascertain further HPI elements such as duration, intensity, aggravating/alleviating factors, etc.  Son-in-law/HCPOA at bedside provides history: patient has complained of intermittent chest pain at night for the last few days. Had CP this morning and so came to hospital. ROS is limited, but no apparent SOB or other related symptoms. ED documentation noted pain in both arms (CTA negative for dissection).  Cardiology evaluated in ED and recommended conservative management with heparin, etc, no LHC.   ED Course: afebrile, hypertensive 190/77, normal sat, RR. Treated with ASA, heparin, Lasix, Imdur, NTG patch, hydralazine. Pertinent labs: BMp unremarkable, troponin 5.37, BNP 1348. CBC WNL. Imaging: CXR independently reviewed. Pulmonary edema, left pleural effusion. Agree with radiology interpretation. CTA chest: no dissection. Early interstitial edema. nonacute large left inguinal hernia. Small left pleural effusion.  Review of Systems:  Limited by patient condition, but per son-in-law: Negative for fever, visual changes, sore throat, rash, new muscle aches, SOB, dysuria, bleeding, n/v/abdominal pain.  Past Medical History:  Diagnosis Date  . Cellulitis of left lower leg 11/30/2012  . Dementia   . Diverticulosis of colon   . Hyperlipidemia   . Hypertension   . Hypothyroidism   . ONYCHOMYCOSIS, TOENAILS  02/25/2008   Qualifier: Diagnosis of  By: Cato Mulligan MD, Bruce    . Parkinson disease (HCC)   . Undiagnosed cardiac murmurs    a. 06/2008 Echo: EF nl, mild LVH, mild AI, atrial septal aneurysm, mild PR/TR, mildly increased PASP.    Past Surgical History:  Procedure Laterality Date  . ABDOMINAL HYSTERECTOMY       reports that she has never smoked. She has never used smokeless tobacco. She reports that she does not drink alcohol or use drugs. Mobility:without assistance/device  Allergies  Allergen Reactions  . Citalopram Hydrobromide     REACTION: hallucinations    Family History  Problem Relation Age of Onset  . Stroke Mother   . Cancer Father     prostate     Prior to Admission medications   Medication Sig Start Date End Date Taking? Authorizing Provider  donepezil (ARICEPT) 5 MG tablet TAKE 1 TABLET (5 MG TOTAL) BY MOUTH AT BEDTIME. 10/31/15  Yes [name], DO  ibuprofen (ADVIL,MOTRIN) 200 MG tablet Take 400 mg by mouth every 6 (six) hours as needed for moderate pain.   Yes Historical Provider, MD  levothyroxine (SYNTHROID, LEVOTHROID) 50 MCG tablet TAKE 1 TABLET (50 MCG TOTAL) BY MOUTH DAILY BEFORE BREAKFAST. 02/16/16  Yes [name], DO  lisinopril (PRINIVIL,ZESTRIL) 40 MG tablet TAKE 1 TABLET (40 MG TOTAL) BY MOUTH DAILY. 10/24/15  Yes [name], DO  cephALEXin (KEFLEX) 500 MG capsule Take 1 capsule (500 mg total) by mouth 2 (two) times daily. Patient not taking: Reported on 03/09/2016 02/01/16   Shirline Frees, NP    Physical Exam: Vitals:   03/09/16 0717 03/09/16 0718 03/09/16 0730 03/09/16 0821  BP: (!) 205/71 198/69 174/60 198/71  Pulse:  65 74  Resp:   16 15  Temp:      TempSrc:      SpO2:   97% 98%  Weight:      Height:        Constitutional:  . Appears mildly agitated and uncomfortable, fidgeting in bed. Nontoxic. No apparent pain. Eyes:  . Pupils and irises appear normal, but exam limited by minimal cooperation . Normal lids ENMT:  . grossly  normal hearing . Lips appear normal Neck:  . neck appears normal, no masses, normal ROM, supple . no thyromegaly Respiratory:  . CTA bilaterally, no w/r/r.  . Respiratory effort normal.  Cardiovascular:  . RRR, no m/r/g . No LE extremity edema   Abdomen:  . Abdomen appears normal; no tenderness or masses, well-healed vertical lower abd scar . No hernias Musculoskeletal:  . Digits/nails BUE/BLE: no clubbing, cyanosis, petechiae, infection . RUE, LUE, RLE, LLE   o strength and tone normal, no atrophy o No tenderness, masses Skin:  . No lesions, ulcers; chronic-appearing skin changes/erythema LLE c/w previous infection . palpation of skin: no induration or nodules Psychiatric:  . judgement and insight appear impaired . Mental status o Mood, affect difficult to assess secondary to irritability and agitation.  Wt Readings from Last 3 Encounters:  03/09/16 54.4 kg (120 lb)  01/30/16 53.9 kg (118 lb 12.8 oz)  04/21/15 57.2 kg (126 lb)    I have personally reviewed following labs and imaging studies  Labs on Admission:  CBC:  Recent Labs Lab 03/09/16 0627  WBC 6.3  HGB 12.7  HCT 41.7  MCV 96.1  PLT 154   Basic Metabolic Panel:  Recent Labs Lab 03/09/16 0627  NA 140  K 3.4*  CL 104  CO2 27  GLUCOSE 101*  BUN 22*  CREATININE 0.95  CALCIUM 8.9   Coagulation Profile:  Recent Labs Lab 03/09/16 0627  INR 1.23   Cardiac Enzymes:  Recent Labs Lab 03/09/16 0627  TROPONINI 5.37*   Urine analysis:    Component Value Date/Time   COLORURINE YELLOW 12/23/2014 1942   APPEARANCEUR CLOUDY (A) 12/23/2014 1942   LABSPEC 1.015 12/23/2014 1942   PHURINE 7.0 12/23/2014 1942   GLUCOSEU NEGATIVE 12/23/2014 1942   HGBUR TRACE (A) 12/23/2014 1942   BILIRUBINUR NEGATIVE 12/23/2014 1942   BILIRUBINUR negative 12/06/2014 1507   KETONESUR NEGATIVE 12/23/2014 1942   PROTEINUR NEGATIVE 12/23/2014 1942   UROBILINOGEN 2.0 (H) 12/23/2014 1942   NITRITE NEGATIVE  12/23/2014 1942   LEUKOCYTESUR NEGATIVE 12/23/2014 1942    Radiological Exams on Admission: Dg Chest 2 View  Result Date: 03/09/2016 CLINICAL DATA:  Chest pain and BILATERAL arm pain. EXAM: CHEST  2 VIEW COMPARISON:  11/29/2012. FINDINGS: The heart is enlarged. Calcified tortuous aorta. BILATERAL pulmonary opacities favored to represent early pulmonary edema. Elevated LEFT hemidiaphragm, likely subsegmental atelectasis LEFT base. Definite lobar consolidation not established. Small LEFT effusion. No pneumothorax. No osseous findings. IMPRESSION: Cardiomegaly, question early pulmonary edema. Worsening aeration compared with priors. Retrocardiac density favored to represent elevated LEFT hemidiaphragm due to LEFT lower lobe subsegmental volume loss. Electronically Signed   By: Elsie Stain M.D.   On: 03/09/2016 06:53   Ct Angio Chest/abd/pel For Dissection W And/or W/wo  Result Date: 03/09/2016 CLINICAL DATA:  Upper chest pain, bilateral arm pain for 3 days. Cough, shortness of breath. EXAM: CT ANGIOGRAPHY CHEST, ABDOMEN AND PELVIS TECHNIQUE: Multidetector CT imaging through the chest, abdomen and pelvis was performed using the standard protocol during bolus administration of  intravenous contrast. Multiplanar reconstructed images and MIPs were obtained and reviewed to evaluate the vascular anatomy. CONTRAST:  100 cc Isovue 370 IV COMPARISON:  None. FINDINGS: CTA CHEST FINDINGS Cardiovascular: Slight dilatation of the ascending thoracic aorta, 4 cm maximally. Calcifications in the aortic arch and descending thoracic aorta. No dissection. There is cardiomegaly. Mediastinum/Nodes: No mediastinal, hilar, or axillary adenopathy. Lungs/Pleura: Small left pleural effusion. Vascular congestion. Ground-glass opacities throughout the lungs could reflect early interstitial edema. No confluent opacities. Musculoskeletal: No acute bony abnormality or focal bone lesion. Review of the MIP images confirms the above  findings. CTA ABDOMEN AND PELVIS FINDINGS VASCULAR Aorta: Diffuse calcifications throughout the aorta and iliac vessels. No aneurysm or dissection. Celiac: Patent SMA: Origin is heavily calcified with mild narrowing, likely not hemodynamically significant. Renals: Tiny accessory renal artery to the left lower pole. Renal arteries are patent. IMA: Patent Inflow: Patent Veins: Grossly unremarkable. Review of the MIP images confirms the above findings. NON-VASCULAR Hepatobiliary: Numerous hepatic cysts. No biliary ductal dilatation. Gallbladder grossly unremarkable. Pancreas: No focal abnormality or ductal dilatation. Spleen: No focal abnormality.  Normal size. Adrenals/Urinary Tract: No hydronephrosis. No renal or adrenal mass. Urinary bladder unremarkable. Stomach/Bowel: Moderate stool burden throughout the colon. Scattered sigmoid diverticulosis. No active diverticulitis. No evidence of bowel obstruction. Lymphatic: No adenopathy. Reproductive: Prior hysterectomy.  No adnexal mass. Other: Large left inguinal hernia containing small bowel loops. No obstruction. No free fluid or free air. Musculoskeletal: Severe chronic appearing compression fracture at L1 with kyphosis. Review of the MIP images confirms the above findings. IMPRESSION: No evidence of aortic dissection. Slight aneurysmal dilatation of the ascending thoracic aorta, 4 cm. Aortic atherosclerosis. Recommend annual imaging followup by CTA or MRA. This recommendation follows 2010 ACCF/AHA/AATS/ACR/ASA/SCA/SCAI/SIR/STS/SVM Guidelines for the Diagnosis and Management of Patients with Thoracic Aortic Disease. Circulation. 2010; 121: Z610-R604 Cardiomegaly with vascular congestion. Ground-glass opacities in the lungs could reflect early interstitial edema. Small left pleural effusion. Numerous hepatic cysts. Large left inguinal hernia containing small bowel loops. No obstruction. Electronically Signed   By: Charlett Nose M.D.   On: 03/09/2016 08:28    EKG:  Independently reviewed: 0612: SR, LAD, LAFB, lateral ST depression (old), no acute changes 0729 SR, lateral ST depression (old); no acute changes  Principal Problem:   NSTEMI (non-ST elevated myocardial infarction) (HCC) Active Problems:   Essential hypertension   Dementia   Accelerated hypertension   Hyperlipidemia   Assessment/Plan 89yow presented with h/o CP. Initial troponin 5.37 c/w NSTEMI.  1. NSTEMI. Seems to be pain free. Hemodynamics stable.  Heparin x48 hours. No beta-blocker per cardiology (bradycardia). Add long-acting nitrate, +/- Plavix deferred to cardiology. 2. Accelerated HTN/Hypertensive urgency.  Continue lisinopril, add nitrate and PRN hydralazine.  3. Acute CHF with pulmonary edema on CXR  Asymptomatic. BNP 1348  check echo to characterize  Monitor clinically, no clinical evidence of volume overload 4. Hyperlipidemia   Statin  5. Hypokalemia  Replete  6. Parkinson's disease 7. Dementia. Mildly agitated   Supportive care. Continue Aricept.   Agree with cardiology >> conservative management. Overall stable, will admit to telemetry (DNR/DNI, dementia with mild agitiation, therefore think SDU intensity would be detrimental to care without offering significant advantage over telemetry >> minimize stimulation)  ADDENDUM RR for new afib with RVR  Asymptomatic  Appeared calm and comfortable Lungs clear EKGx2 RVR with inferolateral ST depression, rate-related changes Treated with IV metoprolol x1, converted to SB F/u EKG SB, LVH, no acute changes. Will start low dose metoprolol this PM  It is my clinical opinion that admission to INPATIENT is reasonable and necessary in this patient . presenting with symptoms of chest pain, concerning for NSTEMI  . in the context of PMH including: dementia, HTN, hyperlipidemia  . and pertinent positives on radiographic and laboratory data including: pulmonary edema, elevated troponin  Given the aforementioned, the  predictability of an adverse outcome is felt to be significant. I expect that the patient will require at least 2 midnights in the hospital to treat this condition.   DVT prophylaxis:heparin Code Status: DNR/DNI Family Communication: son-in-law/HCPOA Consults called: cardiology    Time spent: 50 minutes  Brendia Sacksaniel Terez Montee, MD  [facility] Direct contact: [phone] --Via amion app OR  --[email or web address]; password [email or web address]  7PM-7AM contact night coverage as above  03/09/2016, 8:59 AM

## 2016-03-09 NOTE — ED Notes (Signed)
Called 4th floor and spoke with Hillary, pt can go up in 15 minutes.

## 2016-03-10 DIAGNOSIS — I5033 Acute on chronic diastolic (congestive) heart failure: Secondary | ICD-10-CM

## 2016-03-10 DIAGNOSIS — F0390 Unspecified dementia without behavioral disturbance: Secondary | ICD-10-CM

## 2016-03-10 DIAGNOSIS — J810 Acute pulmonary edema: Secondary | ICD-10-CM

## 2016-03-10 DIAGNOSIS — E039 Hypothyroidism, unspecified: Secondary | ICD-10-CM

## 2016-03-10 DIAGNOSIS — I5031 Acute diastolic (congestive) heart failure: Secondary | ICD-10-CM

## 2016-03-10 DIAGNOSIS — E785 Hyperlipidemia, unspecified: Secondary | ICD-10-CM

## 2016-03-10 LAB — LIPID PANEL
CHOL/HDL RATIO: 3.9 ratio
CHOLESTEROL: 212 mg/dL — AB (ref 0–200)
HDL: 54 mg/dL (ref >40)
LDL Cholesterol: 150 mg/dL — ABNORMAL HIGH (ref 0–99)
Triglycerides: 42 mg/dL (ref <150)
VLDL: 8 mg/dL (ref 0–40)

## 2016-03-10 LAB — CBC
HEMATOCRIT: 41.9 % (ref 36.0–46.0)
HEMOGLOBIN: 13.8 g/dL (ref 12.0–15.0)
MCH: 30.9 pg (ref 26.0–34.0)
MCHC: 32.9 g/dL (ref 30.0–36.0)
MCV: 93.7 fL (ref 78.0–100.0)
Platelets: 190 10*3/uL (ref 150–400)
RBC: 4.47 MIL/uL (ref 3.87–5.11)
RDW: 13.7 % (ref 11.5–15.5)
WBC: 6.4 10*3/uL (ref 4.0–10.5)

## 2016-03-10 LAB — BASIC METABOLIC PANEL
Anion gap: 5 (ref 5–15)
BUN: 21 mg/dL — ABNORMAL HIGH (ref 6–20)
CHLORIDE: 108 mmol/L (ref 101–111)
CO2: 27 mmol/L (ref 22–32)
CREATININE: 0.89 mg/dL (ref 0.44–1.00)
Calcium: 8.9 mg/dL (ref 8.9–10.3)
GFR calc non Af Amer: 55 mL/min — ABNORMAL LOW (ref >60)
Glucose, Bld: 94 mg/dL (ref 65–99)
POTASSIUM: 3.7 mmol/L (ref 3.5–5.1)
Sodium: 140 mmol/L (ref 135–145)

## 2016-03-10 LAB — HEPARIN LEVEL (UNFRACTIONATED)
Heparin Unfractionated: 0.35 IU/mL (ref 0.30–0.70)
Heparin Unfractionated: 0.35 IU/mL (ref 0.30–0.70)

## 2016-03-10 LAB — TROPONIN I: TROPONIN I: 5 ng/mL — AB (ref <0.03)

## 2016-03-10 MED ORDER — CARVEDILOL 6.25 MG PO TABS
6.2500 mg | ORAL_TABLET | Freq: Two times a day (BID) | ORAL | Status: DC
  Administered 2016-03-10 – 2016-03-11 (×4): 6.25 mg via ORAL
  Filled 2016-03-10 (×4): qty 1

## 2016-03-10 MED ORDER — AMLODIPINE BESYLATE 5 MG PO TABS
2.5000 mg | ORAL_TABLET | Freq: Every day | ORAL | Status: DC
  Administered 2016-03-10 – 2016-03-11 (×2): 2.5 mg via ORAL
  Filled 2016-03-10 (×2): qty 1

## 2016-03-10 NOTE — Progress Notes (Signed)
Subjective: PT sleeping  When awake comforftable Objective: Vitals:   03/09/16 1641 03/09/16 1653 03/10/16 0022 03/10/16 0603  BP: 125/60 (!) 127/51 (!) 139/46 (!) 181/61  Pulse: 60 (!) 55 (!) 56 66  Resp:   16 16  Temp:   97.5 F (36.4 C) 98.5 F (36.9 C)  TempSrc:   Oral Oral  SpO2:   97% 94%  Weight:    112 lb 14 oz (51.2 kg)  Height:       Weight change:   Intake/Output Summary (Last 24 hours) at 03/10/16 [id number] Last data filed at 03/10/16 0200  Gross per 24 hour  Intake            53.28 ml  Output                0 ml  Net            53.28 ml    General: Alert, awake, oriented x3, in no acute distress Neck:  JVP is normal Heart: Regular rate and rhythm, without murmurs, rubs, gallops.  Lungs: Clear to auscultation.  No rales or wheezes. Exemities:  No edema.   Neuro: Grossly intact, nonfocal.  TEle:  SB/SR as well as bursts of afib with RVR   Lab Results: Results for orders placed or performed during the hospital encounter of 03/09/16 (from the past 24 hour(s))  Troponin I     Status: Abnormal   Collection Time: 03/09/16  1:00 PM  Result Value Ref Range   Troponin I 5.90 (HH) <0.03 ng/mL  Heparin level (unfractionated)     Status: Abnormal   Collection Time: 03/09/16  4:10 PM  Result Value Ref Range   Heparin Unfractionated 0.28 (L) 0.30 - 0.70 IU/mL  Troponin I     Status: Abnormal   Collection Time: 03/09/16  6:13 PM  Result Value Ref Range   Troponin I 5.72 (HH) <0.03 ng/mL  Troponin I     Status: Abnormal   Collection Time: 03/10/16 12:03 AM  Result Value Ref Range   Troponin I 5.00 (HH) <0.03 ng/mL  Basic metabolic panel     Status: Abnormal   Collection Time: 03/10/16  2:10 AM  Result Value Ref Range   Sodium 140 135 - 145 mmol/L   Potassium 3.7 3.5 - 5.1 mmol/L   Chloride 108 101 - 111 mmol/L   CO2 27 22 - 32 mmol/L   Glucose, Bld 94 65 - 99 mg/dL   BUN 21 (H) 6 - 20 mg/dL   Creatinine, Ser 2.950.89 0.44 - 1.00 mg/dL   Calcium 8.9 8.9 - 62.110.3  mg/dL   GFR calc non Af Amer 55 (L) >60 mL/min   GFR calc Af Amer >60 >60 mL/min   Anion gap 5 5 - 15  Heparin level (unfractionated)     Status: None   Collection Time: 03/10/16  2:10 AM  Result Value Ref Range   Heparin Unfractionated 0.35 0.30 - 0.70 IU/mL  CBC     Status: None   Collection Time: 03/10/16  2:10 AM  Result Value Ref Range   WBC 6.4 4.0 - 10.5 K/uL   RBC 4.47 3.87 - 5.11 MIL/uL   Hemoglobin 13.8 12.0 - 15.0 g/dL   HCT 30.841.9 65.736.0 - 84.646.0 %   MCV 93.7 78.0 - 100.0 fL   MCH 30.9 26.0 - 34.0 pg   MCHC 32.9 30.0 - 36.0 g/dL   RDW 96.213.7 95.211.5 - 84.115.5 %   Platelets 190 150 -  400 K/uL    Studies/Results: Ct Angio Chest/abd/pel For Dissection W And/or W/wo  Result Date: 03/09/2016 CLINICAL DATA:  Upper chest pain, bilateral arm pain for 3 days. Cough, shortness of breath. EXAM: CT ANGIOGRAPHY CHEST, ABDOMEN AND PELVIS TECHNIQUE: Multidetector CT imaging through the chest, abdomen and pelvis was performed using the standard protocol during bolus administration of intravenous contrast. Multiplanar reconstructed images and MIPs were obtained and reviewed to evaluate the vascular anatomy. CONTRAST:  100 cc Isovue 370 IV COMPARISON:  None. FINDINGS: CTA CHEST FINDINGS Cardiovascular: Slight dilatation of the ascending thoracic aorta, 4 cm maximally. Calcifications in the aortic arch and descending thoracic aorta. No dissection. There is cardiomegaly. Mediastinum/Nodes: No mediastinal, hilar, or axillary adenopathy. Lungs/Pleura: Small left pleural effusion. Vascular congestion. Ground-glass opacities throughout the lungs could reflect early interstitial edema. No confluent opacities. Musculoskeletal: No acute bony abnormality or focal bone lesion. Review of the MIP images confirms the above findings. CTA ABDOMEN AND PELVIS FINDINGS VASCULAR Aorta: Diffuse calcifications throughout the aorta and iliac vessels. No aneurysm or dissection. Celiac: Patent SMA: Origin is heavily calcified with  mild narrowing, likely not hemodynamically significant. Renals: Tiny accessory renal artery to the left lower pole. Renal arteries are patent. IMA: Patent Inflow: Patent Veins: Grossly unremarkable. Review of the MIP images confirms the above findings. NON-VASCULAR Hepatobiliary: Numerous hepatic cysts. No biliary ductal dilatation. Gallbladder grossly unremarkable. Pancreas: No focal abnormality or ductal dilatation. Spleen: No focal abnormality.  Normal size. Adrenals/Urinary Tract: No hydronephrosis. No renal or adrenal mass. Urinary bladder unremarkable. Stomach/Bowel: Moderate stool burden throughout the colon. Scattered sigmoid diverticulosis. No active diverticulitis. No evidence of bowel obstruction. Lymphatic: No adenopathy. Reproductive: Prior hysterectomy.  No adnexal mass. Other: Large left inguinal hernia containing small bowel loops. No obstruction. No free fluid or free air. Musculoskeletal: Severe chronic appearing compression fracture at L1 with kyphosis. Review of the MIP images confirms the above findings. IMPRESSION: No evidence of aortic dissection. Slight aneurysmal dilatation of the ascending thoracic aorta, 4 cm. Aortic atherosclerosis. Recommend annual imaging followup by CTA or MRA. This recommendation follows 2010 ACCF/AHA/AATS/ACR/ASA/SCA/SCAI/SIR/STS/SVM Guidelines for the Diagnosis and Management of Patients with Thoracic Aortic Disease. Circulation. 2010; 121: Y051-T021 Cardiomegaly with vascular congestion. Ground-glass opacities in the lungs could reflect early interstitial edema. Small left pleural effusion. Numerous hepatic cysts. Large left inguinal hernia containing small bowel loops. No obstruction. Electronically Signed   By: Charlett Nose M.D.   On: 03/09/2016 08:28    Medications:REviewed    @PROBHOSP @  1  NSTEMI  Echo yesterday showed LVEF 55%  Gr I diastolic dysfunction  PAP est at 50 mm Hg    Incidental note of 4 x 3.5 cm cystic nodule in liver   WOuld continue  medical Rx  Heparin for another 24 hours   2  PAF  Pt in and out  WIll need to review fall risk for use with Eliquis   When not in afib HR is rel slow  I would not push for greater control  3.  HTN  BP is labile   I would stop imdur and add very low dose amlodipine 2.5  Follow  If too low then back off.    LOS: 1 day   Dietrich Pates 03/10/2016, 7:28 AM

## 2016-03-10 NOTE — Progress Notes (Addendum)
Notified by telemetry that pt's HR dropped to 35. HR back up to 51 at this time. Pt is sleeping. No acute distress noted. MD notified.

## 2016-03-10 NOTE — Progress Notes (Signed)
ANTICOAGULATION CONSULT NOTE  Pharmacy Consult for IV heparin Indication: chest pain/ACS  Allergies  Allergen Reactions  . Citalopram Hydrobromide     REACTION: hallucinations    Patient Measurements: Height: 5\' 6"  (167.6 cm) Weight: 112 lb 14 oz (51.2 kg) IBW/kg (Calculated) : 59.3 Heparin Dosing Weight: 53.9 kg  Vital Signs: Temp: 97.5 F (36.4 C) (12/30 0022) Temp Source: Oral (12/30 0022) BP: 139/46 (12/30 0022) Pulse Rate: 56 (12/30 0022)  Labs:  Recent Labs  03/09/16 0627 03/09/16 1300 03/09/16 1610 03/09/16 1813 03/10/16 0003 03/10/16 0210  HGB 12.7  --   --   --   --  13.8  HCT 41.7  --   --   --   --  41.9  PLT 154  --   --   --   --  190  APTT 33  --   --   --   --   --   LABPROT 15.5*  --   --   --   --   --   INR 1.23  --   --   --   --   --   HEPARINUNFRC  --   --  0.28*  --   --  0.35  CREATININE 0.95  --   --   --   --  0.89  TROPONINI 5.37* 5.90*  --  5.72* 5.00*  --     Estimated Creatinine Clearance: 32.6 mL/min (by C-G formula based on SCr of 0.89 mg/dL).   Medical History: Past Medical History:  Diagnosis Date  . Cellulitis of left lower leg 11/30/2012  . Dementia   . Diverticulosis of colon   . Hyperlipidemia   . Hypertension   . Hypothyroidism   . ONYCHOMYCOSIS, TOENAILS 02/25/2008   Qualifier: Diagnosis of  By: Cato Mulligan MD, Bruce    . Parkinson disease (HCC)   . Undiagnosed cardiac murmurs    a. 06/2008 Echo: EF nl, mild LVH, mild AI, atrial septal aneurysm, mild PR/TR, mildly increased PASP.    Medications:  Scheduled:  . aspirin EC  81 mg Oral Daily  . atorvastatin  40 mg Oral q1800  . donepezil  5 mg Oral QHS  . isosorbide mononitrate  30 mg Oral Daily  . levothyroxine  50 mcg Oral QAC breakfast  . lisinopril  40 mg Oral Daily  . metoprolol tartrate  12.5 mg Oral BID  . sodium chloride flush  3 mL Intravenous Q12H   Infusions:  . sodium chloride Stopped (03/09/16 0753)  . heparin 650 Units/hr (03/09/16 1802)     Assessment: 82 yo female presents to ER with CC chest pain and radiating pain in both arms. To start IV heparin per pharmacy dosing for NSTEMI. Baseline CBC good  Goal of Therapy:  Heparin level 0.3-0.7 units/ml Monitor platelets by anticoagulation protocol: Yes    03/10/16 Heparin level = 0.35 with heparin infusing @ 650 units/hr CBC stable  No complications of therapy noted  Plan:   Continue IV heparin @ 650 units/hr  Recheck heparin level in 8 hr to confirm therapeutic dose  Follow heparin level & CBC daily  Terrilee Files, PharmD 03/10/2016 2:58 AM

## 2016-03-10 NOTE — Progress Notes (Signed)
[facility] PROGRESS NOTE  Tammy BlendGladys S Branch JYN:[id number] DOB: 08/21/1921 DOA: 03/09/2016 PCP: Terressa KoyanagiKIM, HANNAH R., DO  Interim summary and HPI 89yow PMH of advance dementia, HTN, Hypothyroidism; who presented with chest pain. Troponin 5.37 c/w NSTEMI. Hypertensive urgency on admisison, but otherwise stable and apparently pain free at this moment. Per cardiology rec's plan was for conservative medical management with heparin, ASA, statins and BP control.  Assessment/Plan: 1-NSTEMI: pain free currently -will continue heparin drip for another 24 hours -continue ASA, statins, coreg and lisinopril -will follow cardiology rec's -echo with preserved EF  2-hypertensive urgency and diastolic HF seen on Echo -much better -will continue coreg, lisinopril and per cardiology low dose amlodipine  -will follow levels and adjust further as needed (preventing orthostatic hypotension)  3-HLD -will continue statins   4-dementia: -will continue aricept  5-hypothyroidism Will continue synthroid  6-PAF -rate controlled overall -high risk for falling -CHADsVASC score 3 -on aspirin; discussing risk for chronic anticoagulation   7-acute diastolic CHF and pulmonary edema seen on CXR -resolved with IV lasix -most likely from NSTEMI, hypertensive urgency and PAF -patient with preserved EF -discussed with family importance of low sodium diet  -continue BP control  Code Status: DNR Family Communication: son at bedside  Disposition Plan: continue conservative management for NSTEMI; another 24 hours of IV heparin, continue adjustment for BP control and follow clinical response and cardiology rec's   Consultants:  Cardiology   Procedures:  2-D echo -preserved EF (55%), elevated pulmonary artery pressure and grade 1 DD  Antibiotics:  None   HPI/Subjective: Afebrile, in no acute distress. Currently denying CP. Good O2 sat on RA  Objective: Vitals:   03/10/16 1100 03/10/16 1605  BP:  (!) 123/48 (!) 123/43  Pulse: 60 (!) 58  Resp:  20  Temp:  98.1 F (36.7 C)    Intake/Output Summary (Last 24 hours) at 03/10/16 1813 Last data filed at 03/10/16 1600  Gross per 24 hour  Intake           744.28 ml  Output                0 ml  Net           744.28 ml   Filed Weights   03/09/16 0715 03/09/16 1109 03/10/16 0603  Weight: 54.4 kg (120 lb) 51.2 kg (112 lb 14 oz) 51.2 kg (112 lb 14 oz)    Exam:   General:  Afebrile and currently CP free. Patient easily aroused and comfortable overall. She doesn't talk much. Per family members close to her baseline mentation.  Cardiovascular:irregular, no JVD, no rubs or gallops  Respiratory: good air movement, no crackles and no wheezing appreciated  Abdomen: soft, NT, ND, positive BS  Musculoskeletal: no edema, no cyanosis   Data Reviewed: Basic Metabolic Panel:  Recent Labs Lab 03/09/16 0627 03/10/16 0210  NA 140 140  K 3.4* 3.7  CL 104 108  CO2 27 27  GLUCOSE 101* 94  BUN 22* 21*  CREATININE 0.95 0.89  CALCIUM 8.9 8.9   CBC:  Recent Labs Lab 03/09/16 0627 03/10/16 0210  WBC 6.3 6.4  HGB 12.7 13.8  HCT 41.7 41.9  MCV 96.1 93.7  PLT 154 190   Cardiac Enzymes:  Recent Labs Lab 03/09/16 0627 03/09/16 1300 03/09/16 1813 03/10/16 0003  TROPONINI 5.37* 5.90* 5.72* 5.00*   BNP (last 3 results)  Recent Labs  03/09/16 0718  BNP 1,348.6*    Studies: Dg Chest 2 View  Result  Date: 03/09/2016 CLINICAL DATA:  Chest pain and BILATERAL arm pain. EXAM: CHEST  2 VIEW COMPARISON:  11/29/2012. FINDINGS: The heart is enlarged. Calcified tortuous aorta. BILATERAL pulmonary opacities favored to represent early pulmonary edema. Elevated LEFT hemidiaphragm, likely subsegmental atelectasis LEFT base. Definite lobar consolidation not established. Small LEFT effusion. No pneumothorax. No osseous findings. IMPRESSION: Cardiomegaly, question early pulmonary edema. Worsening aeration compared with priors. Retrocardiac  density favored to represent elevated LEFT hemidiaphragm due to LEFT lower lobe subsegmental volume loss. Electronically Signed   By: Elsie Stain M.D.   On: 03/09/2016 06:53   Ct Angio Chest/abd/pel For Dissection W And/or W/wo  Result Date: 03/09/2016 CLINICAL DATA:  Upper chest pain, bilateral arm pain for 3 days. Cough, shortness of breath. EXAM: CT ANGIOGRAPHY CHEST, ABDOMEN AND PELVIS TECHNIQUE: Multidetector CT imaging through the chest, abdomen and pelvis was performed using the standard protocol during bolus administration of intravenous contrast. Multiplanar reconstructed images and MIPs were obtained and reviewed to evaluate the vascular anatomy. CONTRAST:  100 cc Isovue 370 IV COMPARISON:  None. FINDINGS: CTA CHEST FINDINGS Cardiovascular: Slight dilatation of the ascending thoracic aorta, 4 cm maximally. Calcifications in the aortic arch and descending thoracic aorta. No dissection. There is cardiomegaly. Mediastinum/Nodes: No mediastinal, hilar, or axillary adenopathy. Lungs/Pleura: Small left pleural effusion. Vascular congestion. Ground-glass opacities throughout the lungs could reflect early interstitial edema. No confluent opacities. Musculoskeletal: No acute bony abnormality or focal bone lesion. Review of the MIP images confirms the above findings. CTA ABDOMEN AND PELVIS FINDINGS VASCULAR Aorta: Diffuse calcifications throughout the aorta and iliac vessels. No aneurysm or dissection. Celiac: Patent SMA: Origin is heavily calcified with mild narrowing, likely not hemodynamically significant. Renals: Tiny accessory renal artery to the left lower pole. Renal arteries are patent. IMA: Patent Inflow: Patent Veins: Grossly unremarkable. Review of the MIP images confirms the above findings. NON-VASCULAR Hepatobiliary: Numerous hepatic cysts. No biliary ductal dilatation. Gallbladder grossly unremarkable. Pancreas: No focal abnormality or ductal dilatation. Spleen: No focal abnormality.  Normal  size. Adrenals/Urinary Tract: No hydronephrosis. No renal or adrenal mass. Urinary bladder unremarkable. Stomach/Bowel: Moderate stool burden throughout the colon. Scattered sigmoid diverticulosis. No active diverticulitis. No evidence of bowel obstruction. Lymphatic: No adenopathy. Reproductive: Prior hysterectomy.  No adnexal mass. Other: Large left inguinal hernia containing small bowel loops. No obstruction. No free fluid or free air. Musculoskeletal: Severe chronic appearing compression fracture at L1 with kyphosis. Review of the MIP images confirms the above findings. IMPRESSION: No evidence of aortic dissection. Slight aneurysmal dilatation of the ascending thoracic aorta, 4 cm. Aortic atherosclerosis. Recommend annual imaging followup by CTA or MRA. This recommendation follows 2010 ACCF/AHA/AATS/ACR/ASA/SCA/SCAI/SIR/STS/SVM Guidelines for the Diagnosis and Management of Patients with Thoracic Aortic Disease. Circulation. 2010; 121: I627-O350 Cardiomegaly with vascular congestion. Ground-glass opacities in the lungs could reflect early interstitial edema. Small left pleural effusion. Numerous hepatic cysts. Large left inguinal hernia containing small bowel loops. No obstruction. Electronically Signed   By: Charlett Nose M.D.   On: 03/09/2016 08:28    Scheduled Meds: . amLODipine  2.5 mg Oral Daily  . aspirin EC  81 mg Oral Daily  . atorvastatin  40 mg Oral q1800  . carvedilol  6.25 mg Oral BID WC  . donepezil  5 mg Oral QHS  . levothyroxine  50 mcg Oral QAC breakfast  . lisinopril  40 mg Oral Daily  . sodium chloride flush  3 mL Intravenous Q12H   Continuous Infusions: . sodium chloride Stopped (03/09/16 0753)  . heparin  650 Units/hr (03/09/16 1802)    Principal Problem:   NSTEMI (non-ST elevated myocardial infarction) (HCC) Active Problems:   Essential hypertension   Dementia   Accelerated hypertension   Hyperlipidemia   Hypertension   Grade 1 diastolic HF   hypothyroidism        Time spent: 25 minutes    Vassie Loll  [facility] Pager [phone] If 7PM-7AM, please contact night-coverage at [email or web address], password [facility] 03/10/2016, 6:13 PM  LOS: 1 day

## 2016-03-10 NOTE — Progress Notes (Signed)
ANTICOAGULATION CONSULT NOTE  Pharmacy Consult for IV heparin Indication: chest pain/ACS  Allergies  Allergen Reactions  . Citalopram Hydrobromide     REACTION: hallucinations    Patient Measurements: Height: 5\' 6"  (167.6 cm) Weight: 112 lb 14 oz (51.2 kg) IBW/kg (Calculated) : 59.3 Heparin Dosing Weight: 53.9 kg  Vital Signs: Temp: 98.5 F (36.9 C) (12/30 0603) Temp Source: Oral (12/30 0603) BP: 123/48 (12/30 1100) Pulse Rate: 60 (12/30 1100)  Labs:  Recent Labs  03/09/16 0627 03/09/16 1300 03/09/16 1610 03/09/16 1813 03/10/16 0003 03/10/16 0210 03/10/16 1030  HGB 12.7  --   --   --   --  13.8  --   HCT 41.7  --   --   --   --  41.9  --   PLT 154  --   --   --   --  190  --   APTT 33  --   --   --   --   --   --   LABPROT 15.5*  --   --   --   --   --   --   INR 1.23  --   --   --   --   --   --   HEPARINUNFRC  --   --  0.28*  --   --  0.35 0.35  CREATININE 0.95  --   --   --   --  0.89  --   TROPONINI 5.37* 5.90*  --  5.72* 5.00*  --   --     Estimated Creatinine Clearance: 32.6 mL/min (by C-G formula based on SCr of 0.89 mg/dL).   Medical History: Past Medical History:  Diagnosis Date  . Cellulitis of left lower leg 11/30/2012  . Dementia   . Diverticulosis of colon   . Hyperlipidemia   . Hypertension   . Hypothyroidism   . ONYCHOMYCOSIS, TOENAILS 02/25/2008   Qualifier: Diagnosis of  By: Cato Mulligan MD, Bruce    . Parkinson disease (HCC)   . Undiagnosed cardiac murmurs    a. 06/2008 Echo: EF nl, mild LVH, mild AI, atrial septal aneurysm, mild PR/TR, mildly increased PASP.    Medications:  Scheduled:  . amLODipine  2.5 mg Oral Daily  . aspirin EC  81 mg Oral Daily  . atorvastatin  40 mg Oral q1800  . carvedilol  6.25 mg Oral BID WC  . donepezil  5 mg Oral QHS  . levothyroxine  50 mcg Oral QAC breakfast  . lisinopril  40 mg Oral Daily  . sodium chloride flush  3 mL Intravenous Q12H   Infusions:  . sodium chloride Stopped (03/09/16 0753)  .  heparin 650 Units/hr (03/09/16 1802)    Assessment: 82 yo female presents to ER with CC chest pain and radiating pain in both arms. To start IV heparin per pharmacy dosing for NSTEMI. Baseline CBC good  Goal of Therapy:  Heparin level 0.3-0.7 units/ml Monitor platelets by anticoagulation protocol: Yes   Today: Heparin level therapeutic, stable H/H and pltc WNL No bleeding reported Plans for heparin x 24 hrs more noted   Plan:   Continue IV heparin at present rate (650 units/hr)  Daily heparin level, CBC while on IV heparin  Elie Goody, PharmD, BCPS Pager: [phone] 03/10/2016  11:16 AM

## 2016-03-11 LAB — BASIC METABOLIC PANEL
ANION GAP: 9 (ref 5–15)
BUN: 26 mg/dL — AB (ref 6–20)
CHLORIDE: 107 mmol/L (ref 101–111)
CO2: 25 mmol/L (ref 22–32)
Calcium: 8.8 mg/dL — ABNORMAL LOW (ref 8.9–10.3)
Creatinine, Ser: 0.92 mg/dL (ref 0.44–1.00)
GFR calc Af Amer: >60 mL/min (ref >60)
GFR, EST NON AFRICAN AMERICAN: 52 mL/min — AB (ref >60)
Glucose, Bld: 87 mg/dL (ref 65–99)
POTASSIUM: 3.9 mmol/L (ref 3.5–5.1)
SODIUM: 141 mmol/L (ref 135–145)

## 2016-03-11 LAB — CBC
HEMATOCRIT: 43 % (ref 36.0–46.0)
HEMOGLOBIN: 14.2 g/dL (ref 12.0–15.0)
MCH: 31.3 pg (ref 26.0–34.0)
MCHC: 33 g/dL (ref 30.0–36.0)
MCV: 94.7 fL (ref 78.0–100.0)
Platelets: 160 10*3/uL (ref 150–400)
RBC: 4.54 MIL/uL (ref 3.87–5.11)
RDW: 13.9 % (ref 11.5–15.5)
WBC: 4.5 10*3/uL (ref 4.0–10.5)

## 2016-03-11 LAB — HEPARIN LEVEL (UNFRACTIONATED): HEPARIN UNFRACTIONATED: 0.46 [IU]/mL (ref 0.30–0.70)

## 2016-03-11 MED ORDER — ATORVASTATIN CALCIUM 40 MG PO TABS: 40.0000 mg | ORAL_TABLET | Freq: Every day | ORAL | 1 refills | Status: DC

## 2016-03-11 MED ORDER — APIXABAN 2.5 MG PO TABS: 2.5000 mg | ORAL_TABLET | Freq: Two times a day (BID) | ORAL | 1 refills | Status: DC

## 2016-03-11 MED ORDER — CARVEDILOL 6.25 MG PO TABS: 6.2500 mg | ORAL_TABLET | Freq: Two times a day (BID) | ORAL | 1 refills | Status: DC

## 2016-03-11 MED ORDER — TRAMADOL HCL 50 MG PO TABS: 25.0000 mg | ORAL_TABLET | Freq: Three times a day (TID) | ORAL | 0 refills | Status: DC | PRN

## 2016-03-11 MED ORDER — AMLODIPINE BESYLATE 2.5 MG PO TABS: 2.5000 mg | ORAL_TABLET | Freq: Every day | ORAL | 1 refills | Status: DC

## 2016-03-11 NOTE — Progress Notes (Signed)
Completed D/C teaching with son. Gave prescriptions. Answered questions. Patient will be D/C home in stable condition with family.

## 2016-03-11 NOTE — Progress Notes (Signed)
   Subjective: Patient comfortable  Does not answer questions   Objective: Vitals:   03/10/16 1605 03/10/16 2135 03/11/16 0417 03/11/16 0518  BP: (!) 123/43 (!) 170/51 (!) 185/59 (!) 157/51  Pulse: (!) 58 (!) 50 (!) 54 63  Resp: 20 20 16    Temp: 98.1 F (36.7 C) 97.8 F (36.6 C) 98.7 F (37.1 C)   TempSrc: Oral Oral Oral   SpO2: 96% 98% 98%   Weight:   115 lb 4.8 oz (52.3 kg)   Height:       Weight change: -4 lb 11.2 oz (-2.132 kg)  Intake/Output Summary (Last 24 hours) at 03/11/16 0740 Last data filed at 03/11/16 0600  Gross per 24 hour  Intake           1109.5 ml  Output                0 ml  Net           1109.5 ml    General: Awake , in no acute distress Neck:  JVP is normal Heart: Regular rate and rhythm, without murmurs, rubs, gallops.  Lungs: Clear to auscultation.  No rales or wheezes. Exemities:  No edema.     Tel:  SR    Lab Results: Results for orders placed or performed during the hospital encounter of 03/09/16 (from the past 24 hour(s))  Heparin level (unfractionated)     Status: None   Collection Time: 03/10/16 10:30 AM  Result Value Ref Range   Heparin Unfractionated 0.35 0.30 - 0.70 IU/mL  CBC     Status: None   Collection Time: 03/11/16  5:18 AM  Result Value Ref Range   WBC 4.5 4.0 - 10.5 K/uL   RBC 4.54 3.87 - 5.11 MIL/uL   Hemoglobin 14.2 12.0 - 15.0 g/dL   HCT 23.9 53.2 - 02.3 %   MCV 94.7 78.0 - 100.0 fL   MCH 31.3 26.0 - 34.0 pg   MCHC 33.0 30.0 - 36.0 g/dL   RDW 34.3 56.8 - 61.6 %   Platelets 160 150 - 400 K/uL  Heparin level (unfractionated)     Status: None   Collection Time: 03/11/16  5:57 AM  Result Value Ref Range   Heparin Unfractionated 0.46 0.30 - 0.70 IU/mL    Studies/Results: No results found.  Medications:Reviewed  @PROBHOSP @  1  NSTEMI  Curr appears comfortable  Would d/c heparin   Keep on current meds otherwise  2  PAF  Remains in SR  If she is not a fall risk consider Eliquis 2.5 bid  If concern for fall would  keep on 81 ASA  In that case if used would not place on ASA   3  HTN  BP is labile  I would not push meds further   OK to d/c home  Discuss with family desires for follow up  Given advanced age, dementia, reasonable for primary care to follow   LOS: 2 days   Dietrich Pates 03/11/2016, 7:40 AM

## 2016-03-11 NOTE — Progress Notes (Signed)
ANTICOAGULATION CONSULT NOTE  Pharmacy Consult for IV heparin Indication: NSTEMI  Allergies  Allergen Reactions  . Citalopram Hydrobromide     REACTION: hallucinations    Patient Measurements: Height: 5\' 6"  (167.6 cm) Weight: 115 lb 4.8 oz (52.3 kg) IBW/kg (Calculated) : 59.3 Heparin Dosing Weight: 53.9 kg  Vital Signs: Temp: 98.7 F (37.1 C) (12/31 0417) Temp Source: Oral (12/31 0417) BP: 157/51 (12/31 0518) Pulse Rate: 63 (12/31 0518)  Labs:  Recent Labs  03/09/16 0627 03/09/16 1300  03/09/16 1813 03/10/16 0003 03/10/16 0210 03/10/16 1030 03/11/16 0518 03/11/16 0557  HGB 12.7  --   --   --   --  13.8  --  Tammy.2  --   HCT 41.7  --   --   --   --  41.9  --  43.0  --   PLT 154  --   --   --   --  190  --  160  --   APTT 33  --   --   --   --   --   --   --   --   LABPROT 15.5*  --   --   --   --   --   --   --   --   INR 1.23  --   --   --   --   --   --   --   --   HEPARINUNFRC  --   --   < >  --   --  0.35 0.35  --  0.46  CREATININE 0.95  --   --   --   --  0.89  --   --   --   TROPONINI 5.37* 5.90*  --  5.72* 5.00*  --   --   --   --   < > = values in this interval not displayed.  Estimated Creatinine Clearance: 33.3 mL/min (by C-G formula based on SCr of 0.89 mg/dL).   Medical History: Past Medical History:  Diagnosis Date  . Cellulitis of left lower leg 11/30/2012  . Dementia   . Diverticulosis of colon   . Hyperlipidemia   . Hypertension   . Hypothyroidism   . ONYCHOMYCOSIS, TOENAILS 02/25/2008   Qualifier: Diagnosis of  By: Cato Mulligan MD, Bruce    . Parkinson disease (HCC)   . Undiagnosed cardiac murmurs    a. 06/2008 Echo: EF nl, mild LVH, mild AI, atrial septal aneurysm, mild PR/TR, mildly increased PASP.    Medications:  Scheduled:  . amLODipine  2.5 mg Oral Daily  . aspirin EC  81 mg Oral Daily  . atorvastatin  40 mg Oral q1800  . carvedilol  6.25 mg Oral BID WC  . donepezil  5 mg Oral QHS  . levothyroxine  50 mcg Oral QAC breakfast  .  lisinopril  40 mg Oral Daily  . sodium chloride flush  3 mL Intravenous Q12H   Infusions:  . sodium chloride Stopped (03/09/16 0753)  . heparin 650 Units/hr (03/10/16 2124)    Assessment: 82 yo Branch presents to ER with CC chest pain and radiating pain in both arms. To start IV heparin per pharmacy dosing for NSTEMI. Baseline CBC good  Goal of Therapy:  Heparin level 0.3-0.7 units/ml Monitor platelets by anticoagulation protocol: Yes   Today: Heparin level therapeutic, stable H/H and pltc WNL No bleeding reported Per cardiology notes anticipate heparin may be stopped later today   Plan:  Continue IV heparin at present rate (650 units/hr)  Await further orders from cardiology  Daily heparin level, CBC while on IV heparin  Elie Goodyandy Lariyah Shetterly, PharmD, BCPS Pager: [phone] 03/11/2016  8:Tammy AM

## 2016-03-11 NOTE — Discharge Summary (Signed)
Physician Discharge Summary  Tammy Branch ZOX:[id number] DOB: 04/27/1921 DOA: 03/09/2016  PCP: Kriste Basque R., DO  Admit date: 03/09/2016 Discharge date: 03/11/2016  Time spent: 35 minutes  Recommendations for Outpatient Follow-up:  1. Repeat BMET to follow electrolytes and renal function  2. Repeat CBC to follow Hgb trend (patient started on Eliquis) 3. Reassess BP and adjust antihypertensive regimen as needed    Discharge Diagnoses:  Principal Problem:   NSTEMI (non-ST elevated myocardial infarction) (HCC) Active Problems:   Essential hypertension   Dementia   Accelerated hypertension   Hyperlipidemia   Hypertension   Acute left-sided CHF (congestive heart failure) (HCC)   Acute pulmonary edema (HCC)   Acute diastolic HF (heart failure) (HCC)   Discharge Condition: stable and improved. Discharge home with instructions to follow up with PCP in 10 days.  Diet recommendation: heart healthy diet   Filed Weights   03/09/16 1109 03/10/16 0603 03/11/16 0417  Weight: 51.2 kg (112 lb 14 oz) 51.2 kg (112 lb 14 oz) 52.3 kg (115 lb 4.8 oz)    History of present illness:  89yow PMH of advance dementia, HTN, Hypothyroidism; who presented with chest pain. Troponin 5.37 c/w NSTEMI. Hypertensive urgency on admisison, but otherwise stable and apparently pain free at this moment. Per cardiology rec's plan was for conservative medical management with heparin, ASA, statins and BP control.  Hospital Course:  1-NSTEMI: pain free currently -patient treated with 48 hours of heparin, ASA, statins, B-blocker and ACE -no further CP -Echo with preserved EF -patient discharge on coreg, lisinopril and Eliquis  -cardiology recommended follow up with PCP and continue conservative management   2-hypertensive urgency and diastolic HF seen on Echo -BP much better control -will continue coreg, lisinopril and per cardiology rec's low dose amlodipine  -will follow levels and adjust further as  needed (preventing orthostatic hypotension to minimize chances for falls)  3-HLD -will continue statins   4-dementia: -will continue Aricept and supportive care  5-hypothyroidism -Will continue synthroid  6-PAF -rate controlled overall -risk for falling acceptable as per family reports and she will have 24/7 assistance -CHADsVASC score 3 -discussed risk for chronic anticoagulation with cardiology and with patient's family; Eliquis 2.5mg  BID recommended   7-acute diastolic CHF and pulmonary edema seen on CXR -resolved with IV lasix -most likely from NSTEMI, hypertensive urgency and PAF -patient with preserved EF on 2-D echo -discussed with family importance of low sodium diet  -continue BP control   Procedures:  2-D echo -preserved EF (55%), elevated pulmonary artery pressure and grade 1 DD  Consultations:  Cardiology   Discharge Exam: Vitals:   03/11/16 0518 03/11/16 1454  BP: (!) 157/51 (!) 127/49  Pulse: 63 (!) 51  Resp:  18  Temp:  97.8 F (36.6 C)    General:  Afebrile and currently CP free. Patient easily aroused and comfortable overall. She doesn't talk much. Per family members close to her baseline mentation.  Cardiovascular:irregular, no JVD, no rubs or gallops  Respiratory: good air movement, no crackles and no wheezing appreciated  Abdomen: soft, NT, ND, positive BS  Musculoskeletal: no edema, no cyanosis   Discharge Instructions   Discharge Instructions    Diet - low sodium heart healthy    Complete by:  As directed    Discharge instructions    Complete by:  As directed    Take medications as prescribed  Follow heart healthy diet  Carefull with falls Arrange follow up with PCP in 1 week  Current Discharge Medication List    START taking these medications   Details  amLODipine (NORVASC) 2.5 MG tablet Take 1 tablet (2.5 mg total) by mouth daily. Qty: 30 tablet, Refills: 1    apixaban (ELIQUIS) 2.5 MG TABS tablet Take 1  tablet (2.5 mg total) by mouth 2 (two) times daily. Qty: 60 tablet, Refills: 1    atorvastatin (LIPITOR) 40 MG tablet Take 1 tablet (40 mg total) by mouth daily at 6 PM. Qty: 30 tablet, Refills: 1    carvedilol (COREG) 6.25 MG tablet Take 1 tablet (6.25 mg total) by mouth 2 (two) times daily with a meal. Qty: 60 tablet, Refills: 1    traMADol (ULTRAM) 50 MG tablet Take 0.5 tablets (25 mg total) by mouth every 8 (eight) hours as needed for severe pain. Qty: 20 tablet, Refills: 0      CONTINUE these medications which have NOT CHANGED   Details  donepezil (ARICEPT) 5 MG tablet TAKE 1 TABLET (5 MG TOTAL) BY MOUTH AT BEDTIME. Qty: 90 tablet, Refills: 3   Associated Diagnoses: Paralysis agitans (HCC)    levothyroxine (SYNTHROID, LEVOTHROID) 50 MCG tablet TAKE 1 TABLET (50 MCG TOTAL) BY MOUTH DAILY BEFORE BREAKFAST. Qty: 90 tablet, Refills: 1    lisinopril (PRINIVIL,ZESTRIL) 40 MG tablet TAKE 1 TABLET (40 MG TOTAL) BY MOUTH DAILY. Qty: 90 tablet, Refills: 3      STOP taking these medications     ibuprofen (ADVIL,MOTRIN) 200 MG tablet      cephALEXin (KEFLEX) 500 MG capsule        Allergies  Allergen Reactions  . Citalopram Hydrobromide     REACTION: hallucinations   Follow-up Information    Kriste BasqueKIM, HANNAH R., DO. Schedule an appointment as soon as possible for a visit in 1 week(s).   Specialty:  Family Medicine Contact information: [address] [place] [address] [phone]            The results of significant diagnostics from this hospitalization (including imaging, microbiology, ancillary and laboratory) are listed below for reference.    Significant Diagnostic Studies: Dg Chest 2 View  Result Date: 03/09/2016 CLINICAL DATA:  Chest pain and BILATERAL arm pain. EXAM: CHEST  2 VIEW COMPARISON:  11/29/2012. FINDINGS: The heart is enlarged. Calcified tortuous aorta. BILATERAL pulmonary opacities favored to represent early pulmonary edema. Elevated LEFT  hemidiaphragm, likely subsegmental atelectasis LEFT base. Definite lobar consolidation not established. Small LEFT effusion. No pneumothorax. No osseous findings. IMPRESSION: Cardiomegaly, question early pulmonary edema. Worsening aeration compared with priors. Retrocardiac density favored to represent elevated LEFT hemidiaphragm due to LEFT lower lobe subsegmental volume loss. Electronically Signed   By: Elsie StainJohn T Curnes M.D.   On: 03/09/2016 06:53   Ct Angio Chest/abd/pel For Dissection W And/or W/wo  Result Date: 03/09/2016 CLINICAL DATA:  Upper chest pain, bilateral arm pain for 3 days. Cough, shortness of breath. EXAM: CT ANGIOGRAPHY CHEST, ABDOMEN AND PELVIS TECHNIQUE: Multidetector CT imaging through the chest, abdomen and pelvis was performed using the standard protocol during bolus administration of intravenous contrast. Multiplanar reconstructed images and MIPs were obtained and reviewed to evaluate the vascular anatomy. CONTRAST:  100 cc Isovue 370 IV COMPARISON:  None. FINDINGS: CTA CHEST FINDINGS Cardiovascular: Slight dilatation of the ascending thoracic aorta, 4 cm maximally. Calcifications in the aortic arch and descending thoracic aorta. No dissection. There is cardiomegaly. Mediastinum/Nodes: No mediastinal, hilar, or axillary adenopathy. Lungs/Pleura: Small left pleural effusion. Vascular congestion. Ground-glass opacities throughout the lungs could reflect early interstitial edema.  No confluent opacities. Musculoskeletal: No acute bony abnormality or focal bone lesion. Review of the MIP images confirms the above findings. CTA ABDOMEN AND PELVIS FINDINGS VASCULAR Aorta: Diffuse calcifications throughout the aorta and iliac vessels. No aneurysm or dissection. Celiac: Patent SMA: Origin is heavily calcified with mild narrowing, likely not hemodynamically significant. Renals: Tiny accessory renal artery to the left lower pole. Renal arteries are patent. IMA: Patent Inflow: Patent Veins: Grossly  unremarkable. Review of the MIP images confirms the above findings. NON-VASCULAR Hepatobiliary: Numerous hepatic cysts. No biliary ductal dilatation. Gallbladder grossly unremarkable. Pancreas: No focal abnormality or ductal dilatation. Spleen: No focal abnormality.  Normal size. Adrenals/Urinary Tract: No hydronephrosis. No renal or adrenal mass. Urinary bladder unremarkable. Stomach/Bowel: Moderate stool burden throughout the colon. Scattered sigmoid diverticulosis. No active diverticulitis. No evidence of bowel obstruction. Lymphatic: No adenopathy. Reproductive: Prior hysterectomy.  No adnexal mass. Other: Large left inguinal hernia containing small bowel loops. No obstruction. No free fluid or free air. Musculoskeletal: Severe chronic appearing compression fracture at L1 with kyphosis. Review of the MIP images confirms the above findings. IMPRESSION: No evidence of aortic dissection. Slight aneurysmal dilatation of the ascending thoracic aorta, 4 cm. Aortic atherosclerosis. Recommend annual imaging followup by CTA or MRA. This recommendation follows 2010 ACCF/AHA/AATS/ACR/ASA/SCA/SCAI/SIR/STS/SVM Guidelines for the Diagnosis and Management of Patients with Thoracic Aortic Disease. Circulation. 2010; 121: Z610-R604 Cardiomegaly with vascular congestion. Ground-glass opacities in the lungs could reflect early interstitial edema. Small left pleural effusion. Numerous hepatic cysts. Large left inguinal hernia containing small bowel loops. No obstruction. Electronically Signed   By: Charlett Nose M.D.   On: 03/09/2016 08:28    Microbiology: No results found for this or any previous visit (from the past 240 hour(s)).   Labs: Basic Metabolic Panel:  Recent Labs Lab 03/09/16 0627 03/10/16 0210 03/11/16 0756  NA 140 140 141  K 3.4* 3.7 3.9  CL 104 108 107  CO2 27 27 25   GLUCOSE 101* 94 87  BUN 22* 21* 26*  CREATININE 0.95 0.89 0.92  CALCIUM 8.9 8.9 8.8*   Liver Function Tests: No results for  input(s): AST, ALT, ALKPHOS, BILITOT, PROT, ALBUMIN in the last 168 hours. No results for input(s): LIPASE, AMYLASE in the last 168 hours. No results for input(s): AMMONIA in the last 168 hours. CBC:  Recent Labs Lab 03/09/16 0627 03/10/16 0210 03/11/16 0518  WBC 6.3 6.4 4.5  HGB 12.7 13.8 14.2  HCT 41.7 41.9 43.0  MCV 96.1 93.7 94.7  PLT 154 190 160   Cardiac Enzymes:  Recent Labs Lab 03/09/16 0627 03/09/16 1300 03/09/16 1813 03/10/16 0003  TROPONINI 5.37* 5.90* 5.72* 5.00*   BNP: BNP (last 3 results)  Recent Labs  03/09/16 0718  BNP 1,348.6*    ProBNP (last 3 results) No results for input(s): PROBNP in the last 8760 hours.  CBG: No results for input(s): GLUCAP in the last 168 hours.     Signed:  Vassie Loll MD.  [facility] 03/11/2016, 3:35 PM

## 2016-04-26 ENCOUNTER — Ambulatory Visit (INDEPENDENT_AMBULATORY_CARE_PROVIDER_SITE_OTHER): Payer: Medicare Other | Admitting: Family Medicine

## 2016-04-26 ENCOUNTER — Encounter: Payer: Self-pay | Admitting: Family Medicine

## 2016-04-26 VITALS — BP 110/50 | HR 52 | Temp 97.4°F | Ht 66.0 in | Wt 132.2 lb

## 2016-04-26 DIAGNOSIS — G20 Parkinson's disease: Secondary | ICD-10-CM | POA: Diagnosis not present

## 2016-04-26 DIAGNOSIS — I5042 Chronic combined systolic (congestive) and diastolic (congestive) heart failure: Secondary | ICD-10-CM

## 2016-04-26 DIAGNOSIS — I10 Essential (primary) hypertension: Secondary | ICD-10-CM

## 2016-04-26 DIAGNOSIS — L03116 Cellulitis of left lower limb: Secondary | ICD-10-CM

## 2016-04-26 DIAGNOSIS — I480 Paroxysmal atrial fibrillation: Secondary | ICD-10-CM | POA: Insufficient documentation

## 2016-04-26 DIAGNOSIS — I214 Non-ST elevation (NSTEMI) myocardial infarction: Secondary | ICD-10-CM | POA: Diagnosis not present

## 2016-04-26 DIAGNOSIS — E038 Other specified hypothyroidism: Secondary | ICD-10-CM | POA: Diagnosis not present

## 2016-04-26 MED ORDER — CEPHALEXIN 500 MG PO CAPS: 500.0000 mg | ORAL_CAPSULE | Freq: Three times a day (TID) | ORAL | 0 refills | Status: DC

## 2016-04-26 MED ORDER — APIXABAN 2.5 MG PO TABS: 2.5000 mg | ORAL_TABLET | Freq: Two times a day (BID) | ORAL | 5 refills | Status: AC

## 2016-04-26 NOTE — Progress Notes (Signed)
HPI:  Tammy Branch is a pleasant 83 yo with a PMH significant for HTN, Hyperlipidemia, CHF, Hypothyroidism, Parkinson disease and advanced dementia with a recent hospitalization for NSTEMI w/ stay complicated by A. Fib with RVR here for follow up. Discharged on coreg, lisinopril and eliquis. Son reports she has done fine in terms of the heart - no CP, SOB, DOE, bleeding or worsening swelling.  Did scrape L shin on edge of tub last week and has small skin tear now with some surrounding erythema. No falls, pus, fevers, malaise, chills.  ROS: See pertinent positives and negatives per HPI.  Past Medical History:  Diagnosis Date  . Cellulitis of left lower leg 11/30/2012  . Dementia   . Diverticulosis of colon   . Hyperlipidemia   . Hypertension   . Hypothyroidism   . ONYCHOMYCOSIS, TOENAILS 02/25/2008   Qualifier: Diagnosis of  By: Cato Mulligan MD, Bruce    . Parkinson disease (HCC)   . Undiagnosed cardiac murmurs    a. 06/2008 Echo: EF nl, mild LVH, mild AI, atrial septal aneurysm, mild PR/TR, mildly increased PASP.    Past Surgical History:  Procedure Laterality Date  . ABDOMINAL HYSTERECTOMY      Family History  Problem Relation Age of Onset  . Stroke Mother   . Cancer Father     prostate    Social History   Social History  . Marital status: Married    Spouse name: N/A  . Number of children: N/A  . Years of education: N/A   Social History Main Topics  . Smoking status: Never Smoker  . Smokeless tobacco: Never Used  . Alcohol use No  . Drug use: No  . Sexual activity: Not Currently   Other Topics Concern  . None   Social History Narrative   Lives locally with family.  Sedentary.     Current Outpatient Prescriptions:  .  amLODipine (NORVASC) 2.5 MG tablet, Take 1 tablet (2.5 mg total) by mouth daily., Disp: 30 tablet, Rfl: 1 .  apixaban (ELIQUIS) 2.5 MG TABS tablet, Take 1 tablet (2.5 mg total) by mouth 2 (two) times daily., Disp: 60 tablet, Rfl: 5 .   atorvastatin (LIPITOR) 40 MG tablet, Take 1 tablet (40 mg total) by mouth daily at 6 PM., Disp: 30 tablet, Rfl: 1 .  carvedilol (COREG) 6.25 MG tablet, Take 1 tablet (6.25 mg total) by mouth 2 (two) times daily with a meal., Disp: 60 tablet, Rfl: 1 .  donepezil (ARICEPT) 5 MG tablet, TAKE 1 TABLET (5 MG TOTAL) BY MOUTH AT BEDTIME., Disp: 90 tablet, Rfl: 3 .  levothyroxine (SYNTHROID, LEVOTHROID) 50 MCG tablet, TAKE 1 TABLET (50 MCG TOTAL) BY MOUTH DAILY BEFORE BREAKFAST., Disp: 90 tablet, Rfl: 1 .  lisinopril (PRINIVIL,ZESTRIL) 40 MG tablet, TAKE 1 TABLET (40 MG TOTAL) BY MOUTH DAILY., Disp: 90 tablet, Rfl: 3 .  traMADol (ULTRAM) 50 MG tablet, Take 0.5 tablets (25 mg total) by mouth every 8 (eight) hours as needed for severe pain., Disp: 20 tablet, Rfl: 0 .  cephALEXin (KEFLEX) 500 MG capsule, Take 1 capsule (500 mg total) by mouth 3 (three) times daily., Disp: 15 capsule, Rfl: 0  EXAM:  Vitals:   04/26/16 1353  BP: (!) 110/50  Pulse: (!) 52  Temp: 97.4 F (36.3 C)    Body mass index is 21.34 kg/m.  GENERAL: vitals reviewed and listed above, alert, oriented, appears well hydrated and in no acute distress  HEENT: atraumatic, conjunttiva clear, no obvious abnormalities on  inspection of external nose and ears  NECK: no obvious masses on inspection  LUNGS: clear to auscultation bilaterally, no wheezes, rales or rhonchi, good air movement  CV: HRRR, tr edema R, skin tear L shin with serous drainage and some surrounding edema and erythema  MS: moves all extremities without noticeable abnormality  PSYCH: pleasant and cooperative, no obvious depression or anxiety  ASSESSMENT AND PLAN:  Discussed the following assessment and plan:  Cellulitis of left lower extremity  NSTEMI (non-ST elevated myocardial infarction) (HCC)  Essential hypertension  Other specified hypothyroidism  PARKINSON'S DISEASE  Chronic combined systolic and diastolic congestive heart failure  (HCC)  Paroxysmal atrial fibrillation (HCC)  -start abx for cellulitis, they opted to do labs when they follow up for this next week -wound care recs, return precuations -printed rx for elequis as son would like to see if this is cheaper elsewhere -no falls -has 24/7 care at home -Patient advised to return or notify a doctor immediately if symptoms worsen or persist or new concerns arise.  Patient Instructions  BEFORE YOU LEAVE: -follow up: in 1 week to check leg and we will plan on labs then when leg is doing better -printed eliquis rx -wound care supplies  Elevate leg 30 minutes twice daily.  Take the antibiotic as instructed.  Labs in 1 week at follow up.   Kriste Basque R., DO

## 2016-04-26 NOTE — Patient Instructions (Addendum)
BEFORE YOU LEAVE: -follow up: in 1 week to check leg and we will plan on labs then when leg is doing better -printed eliquis rx -wound care supplies  Elevate leg 30 minutes twice daily.  Take the antibiotic as instructed.  Labs in 1 week at follow up.

## 2016-04-26 NOTE — Progress Notes (Signed)
Pre visit review using our clinic review tool, if applicable. No additional management support is needed unless otherwise documented below in the visit note. 

## 2016-05-01 ENCOUNTER — Other Ambulatory Visit: Payer: Self-pay | Admitting: Family Medicine

## 2016-05-01 ENCOUNTER — Encounter: Payer: Self-pay | Admitting: Family Medicine

## 2016-05-01 NOTE — Progress Notes (Signed)
I left a detailed message at [facility] voicemail with the information below and to call for an appt with another physician on Wednesday if needed since Dr Selena Batten is out of the office.

## 2016-05-01 NOTE — Progress Notes (Signed)
Tammy Branch,  They requested refill abx. Refill sent, but wanted to see how she is doing. Ok if doing well, no fevers, redness not spreading, no pus. If worsening needs to bump up appt. Thanks!

## 2016-05-03 ENCOUNTER — Ambulatory Visit (INDEPENDENT_AMBULATORY_CARE_PROVIDER_SITE_OTHER): Payer: Medicare Other | Admitting: Family Medicine

## 2016-05-03 ENCOUNTER — Other Ambulatory Visit: Payer: Self-pay | Admitting: Family Medicine

## 2016-05-03 ENCOUNTER — Encounter: Payer: Self-pay | Admitting: Family Medicine

## 2016-05-03 VITALS — BP 160/60 | HR 54 | Temp 97.5°F | Ht 66.0 in

## 2016-05-03 DIAGNOSIS — R600 Localized edema: Secondary | ICD-10-CM

## 2016-05-03 DIAGNOSIS — S81802D Unspecified open wound, left lower leg, subsequent encounter: Secondary | ICD-10-CM

## 2016-05-03 DIAGNOSIS — L03116 Cellulitis of left lower limb: Secondary | ICD-10-CM

## 2016-05-03 LAB — CBC
HCT: 38.5 % (ref 36.0–46.0)
Hemoglobin: 12.6 g/dL (ref 12.0–15.0)
MCHC: 32.7 g/dL (ref 30.0–36.0)
MCV: 95.9 fl (ref 78.0–100.0)
Platelets: 183 10*3/uL (ref 150.0–400.0)
RBC: 4.02 Mil/uL (ref 3.87–5.11)
RDW: 15.3 % (ref 11.5–15.5)
WBC: 4.1 10*3/uL (ref 4.0–10.5)

## 2016-05-03 LAB — BASIC METABOLIC PANEL
BUN: 21 mg/dL (ref 6–23)
CO2: 31 mEq/L (ref 19–32)
CREATININE: 0.9 mg/dL (ref 0.40–1.20)
Calcium: 8.6 mg/dL (ref 8.4–10.5)
Chloride: 102 mEq/L (ref 96–112)
GFR: 62.15 mL/min (ref >60.00)
Glucose, Bld: 124 mg/dL — ABNORMAL HIGH (ref 70–99)
Potassium: 4.4 mEq/L (ref 3.5–5.1)
Sodium: 135 mEq/L (ref 135–145)

## 2016-05-03 MED ORDER — FUROSEMIDE 20 MG PO TABS: ORAL_TABLET | ORAL | 0 refills | Status: DC

## 2016-05-03 MED ORDER — CLINDAMYCIN HCL 300 MG PO CAPS: 300.0000 mg | ORAL_CAPSULE | Freq: Three times a day (TID) | ORAL | 0 refills | Status: DC

## 2016-05-03 NOTE — Progress Notes (Signed)
Pre visit review using our clinic review tool, if applicable. No additional management support is needed unless otherwise documented below in the visit note. 

## 2016-05-03 NOTE — Progress Notes (Addendum)
HPI:  Tammy Branch is a 83 yo with aPMH Dementia, Parkinson's dz, PAF, hx NSTEMI, HTN, Hypothyroidsim, Hyperlipidemia and recent L lower ext lac with cellulitis here for follow up regarding the cellulitis. Son does not feel has improve much. Still with clear drainage form wound L LE. No fevers, chills, malaise, SOB, DOE, CP. Son reports she is eating normal. They are changing dressing several times daily.  ROS: See pertinent positives and negatives per HPI.  Past Medical History:  Diagnosis Date  . Cellulitis of left lower leg 11/30/2012  . Dementia   . Diverticulosis of colon   . Hyperlipidemia   . Hypertension   . Hypothyroidism   . ONYCHOMYCOSIS, TOENAILS 02/25/2008   Qualifier: Diagnosis of  By: Cato Mulligan MD, Bruce    . Parkinson disease (HCC)   . Undiagnosed cardiac murmurs    a. 06/2008 Echo: EF nl, mild LVH, mild AI, atrial septal aneurysm, mild PR/TR, mildly increased PASP.    Past Surgical History:  Procedure Laterality Date  . ABDOMINAL HYSTERECTOMY      Family History  Problem Relation Age of Onset  . Stroke Mother   . Cancer Father     prostate    Social History   Social History  . Marital status: Married    Spouse name: N/A  . Number of children: N/A  . Years of education: N/A   Social History Main Topics  . Smoking status: Never Smoker  . Smokeless tobacco: Never Used  . Alcohol use No  . Drug use: No  . Sexual activity: Not Currently   Other Topics Concern  . None   Social History Narrative   Lives locally with family.  Sedentary.     Current Outpatient Prescriptions:  .  amLODipine (NORVASC) 2.5 MG tablet, Take 1 tablet (2.5 mg total) by mouth daily., Disp: 30 tablet, Rfl: 1 .  apixaban (ELIQUIS) 2.5 MG TABS tablet, Take 1 tablet (2.5 mg total) by mouth 2 (two) times daily., Disp: 60 tablet, Rfl: 5 .  atorvastatin (LIPITOR) 40 MG tablet, Take 1 tablet (40 mg total) by mouth daily at 6 PM., Disp: 30 tablet, Rfl: 1 .  carvedilol (COREG) 6.25  MG tablet, Take 1 tablet (6.25 mg total) by mouth 2 (two) times daily with a meal., Disp: 60 tablet, Rfl: 1 .  donepezil (ARICEPT) 5 MG tablet, TAKE 1 TABLET (5 MG TOTAL) BY MOUTH AT BEDTIME., Disp: 90 tablet, Rfl: 3 .  levothyroxine (SYNTHROID, LEVOTHROID) 50 MCG tablet, TAKE 1 TABLET (50 MCG TOTAL) BY MOUTH DAILY BEFORE BREAKFAST., Disp: 90 tablet, Rfl: 1 .  lisinopril (PRINIVIL,ZESTRIL) 40 MG tablet, TAKE 1 TABLET (40 MG TOTAL) BY MOUTH DAILY., Disp: 90 tablet, Rfl: 3 .  traMADol (ULTRAM) 50 MG tablet, Take 0.5 tablets (25 mg total) by mouth every 8 (eight) hours as needed for severe pain., Disp: 20 tablet, Rfl: 0 .  clindamycin (CLEOCIN) 300 MG capsule, Take 1 capsule (300 mg total) by mouth 3 (three) times daily., Disp: 15 capsule, Rfl: 0  EXAM:  Vitals:   05/03/16 1304  BP: (!) 160/60  Pulse: (!) 54  Temp: 97.5 F (36.4 C)    There is no height or weight on file to calculate BMI.  GENERAL: vitals reviewed and listed above, alert, oriented, appears well hydrated and in no acute distress  HEENT: atraumatic, conjunttiva clear, no obvious abnormalities on inspection of external nose and ears  NECK: no obvious masses on inspection  LUNGS: clear to auscultation bilaterally, no  wheezes, rales or rhonchi, good air movement  CV: HRRR, bilat LE edema to knees, wearing compression on R, 2-3 cm superficial wound L ant shin with fibrotic tissue filling wound, clear drainage and surrounding erythema and edema of the skin involving the entire lower leg from the ankle to the knee  MS: moves all extremities without noticeable abnormality  PSYCH: pleasant and cooperative, no obvious depression or anxiety  ASSESSMENT AND PLAN:  Discussed the following assessment and plan:  Cellulitis of left lower extremity - Plan: AMB referral to wound care center, Basic metabolic panel, CBC  Wound of left lower extremity, subsequent encounter - Plan: AMB referral to wound care center  Leg edema - Plan:  AMB referral to wound care center  -poor healing, likely 2ndary to edema - a chronic issue for her -referral to wound clinic -change to clinda for broadened coverage in interim given recent hospitalization and not improving on keflex - no purulent discharge, abscess or systemic symptoms today -will check renal function and do low dose lasix for a few days if renal function ok -elevation -BP a little up, lower last visit - follow up here in 2 weeks or sooner if needed -Patient advised to return or notify a doctor immediately if symptoms worsen or persist or new concerns arise.  Patient Instructions  BEFORE YOU LEAVE: Follow up in 2 weeks Wound clinic appointment details labs  Stop the keflex and start the clindamycin  Elevate leg 30 minutes twice daily  Once we get labs back we will start a fluid pill  See the wound clinic as planned      Kriste Basque R., DO

## 2016-05-03 NOTE — Patient Instructions (Addendum)
BEFORE YOU LEAVE: Follow up in 2 weeks Wound clinic appointment details labs  Stop the keflex and start the clindamycin  Elevate leg 30 minutes twice daily  Once we get labs back we will start a fluid pill  See the wound clinic as planned

## 2016-05-07 ENCOUNTER — Telehealth: Payer: Self-pay | Admitting: Family Medicine

## 2016-05-07 DIAGNOSIS — G20 Parkinson's disease: Secondary | ICD-10-CM

## 2016-05-07 NOTE — Telephone Encounter (Signed)
Pt request refill   amLODipine (NORVASC) 2.5 MG tablet atorvastatin (LIPITOR) 40 MG tablet carvedilol (COREG) 6.25 MG tablet donepezil (ARICEPT) 5 MG tablet  90 day supply  [id number] - [place], [place] - [address]

## 2016-05-08 MED ORDER — DONEPEZIL HCL 5 MG PO TABS: 5.0000 mg | ORAL_TABLET | Freq: Every day | ORAL | 1 refills | Status: AC

## 2016-05-08 MED ORDER — CARVEDILOL 6.25 MG PO TABS: 6.2500 mg | ORAL_TABLET | Freq: Two times a day (BID) | ORAL | 1 refills | Status: AC

## 2016-05-08 MED ORDER — ATORVASTATIN CALCIUM 40 MG PO TABS: 40.0000 mg | ORAL_TABLET | Freq: Every day | ORAL | 1 refills | Status: AC

## 2016-05-08 MED ORDER — AMLODIPINE BESYLATE 2.5 MG PO TABS: 2.5000 mg | ORAL_TABLET | Freq: Every day | ORAL | 1 refills | Status: AC

## 2016-05-08 NOTE — Telephone Encounter (Signed)
Ok to refil #90, 1 refill. Thanks.

## 2016-05-08 NOTE — Telephone Encounter (Signed)
Rx done. 

## 2016-05-10 ENCOUNTER — Encounter (HOSPITAL_BASED_OUTPATIENT_CLINIC_OR_DEPARTMENT_OTHER): Payer: Medicare Other | Attending: Internal Medicine

## 2016-05-10 DIAGNOSIS — L97822 Non-pressure chronic ulcer of other part of left lower leg with fat layer exposed: Secondary | ICD-10-CM | POA: Insufficient documentation

## 2016-05-10 DIAGNOSIS — I252 Old myocardial infarction: Secondary | ICD-10-CM | POA: Diagnosis not present

## 2016-05-10 DIAGNOSIS — Z8781 Personal history of (healed) traumatic fracture: Secondary | ICD-10-CM

## 2016-05-10 DIAGNOSIS — I10 Essential (primary) hypertension: Secondary | ICD-10-CM | POA: Diagnosis not present

## 2016-05-10 DIAGNOSIS — R600 Localized edema: Secondary | ICD-10-CM | POA: Diagnosis not present

## 2016-05-10 DIAGNOSIS — I872 Venous insufficiency (chronic) (peripheral): Secondary | ICD-10-CM | POA: Diagnosis not present

## 2016-05-10 DIAGNOSIS — F0390 Unspecified dementia without behavioral disturbance: Secondary | ICD-10-CM | POA: Insufficient documentation

## 2016-05-10 DIAGNOSIS — I890 Lymphedema, not elsewhere classified: Secondary | ICD-10-CM | POA: Insufficient documentation

## 2016-05-10 HISTORY — DX: Personal history of (healed) traumatic fracture: Z87.81

## 2016-05-14 DIAGNOSIS — K5790 Diverticulosis of intestine, part unspecified, without perforation or abscess without bleeding: Secondary | ICD-10-CM | POA: Diagnosis not present

## 2016-05-14 DIAGNOSIS — E039 Hypothyroidism, unspecified: Secondary | ICD-10-CM | POA: Diagnosis not present

## 2016-05-14 DIAGNOSIS — F0390 Unspecified dementia without behavioral disturbance: Secondary | ICD-10-CM | POA: Diagnosis not present

## 2016-05-14 DIAGNOSIS — I890 Lymphedema, not elsewhere classified: Secondary | ICD-10-CM | POA: Diagnosis not present

## 2016-05-14 DIAGNOSIS — E785 Hyperlipidemia, unspecified: Secondary | ICD-10-CM | POA: Diagnosis not present

## 2016-05-14 DIAGNOSIS — I4891 Unspecified atrial fibrillation: Secondary | ICD-10-CM | POA: Diagnosis not present

## 2016-05-14 DIAGNOSIS — I10 Essential (primary) hypertension: Secondary | ICD-10-CM | POA: Diagnosis not present

## 2016-05-14 DIAGNOSIS — S81812D Laceration without foreign body, left lower leg, subsequent encounter: Secondary | ICD-10-CM | POA: Diagnosis not present

## 2016-05-17 DIAGNOSIS — F0390 Unspecified dementia without behavioral disturbance: Secondary | ICD-10-CM | POA: Diagnosis not present

## 2016-05-17 DIAGNOSIS — I252 Old myocardial infarction: Secondary | ICD-10-CM | POA: Diagnosis not present

## 2016-05-17 DIAGNOSIS — L97822 Non-pressure chronic ulcer of other part of left lower leg with fat layer exposed: Secondary | ICD-10-CM | POA: Diagnosis not present

## 2016-05-17 DIAGNOSIS — I872 Venous insufficiency (chronic) (peripheral): Secondary | ICD-10-CM | POA: Diagnosis not present

## 2016-05-17 DIAGNOSIS — I890 Lymphedema, not elsewhere classified: Secondary | ICD-10-CM | POA: Diagnosis not present

## 2016-05-17 DIAGNOSIS — I10 Essential (primary) hypertension: Secondary | ICD-10-CM | POA: Diagnosis not present

## 2016-05-24 DIAGNOSIS — R600 Localized edema: Secondary | ICD-10-CM | POA: Diagnosis not present

## 2016-05-24 DIAGNOSIS — L97821 Non-pressure chronic ulcer of other part of left lower leg limited to breakdown of skin: Secondary | ICD-10-CM | POA: Diagnosis not present

## 2016-05-27 ENCOUNTER — Inpatient Hospital Stay (HOSPITAL_COMMUNITY)
Admission: EM | Admit: 2016-05-27 | Discharge: 2016-06-02 | DRG: 469 | Disposition: A | Discharge status: Skilled Nursing Facility | Payer: Medicare Other | Attending: Family Medicine | Admitting: Family Medicine

## 2016-05-27 ENCOUNTER — Encounter (HOSPITAL_COMMUNITY): Payer: Self-pay | Admitting: Emergency Medicine

## 2016-05-27 ENCOUNTER — Inpatient Hospital Stay (HOSPITAL_COMMUNITY): Payer: Medicare Other

## 2016-05-27 ENCOUNTER — Emergency Department (HOSPITAL_COMMUNITY): Payer: Medicare Other

## 2016-05-27 DIAGNOSIS — W010XXA Fall on same level from slipping, tripping and stumbling without subsequent striking against object, initial encounter: Secondary | ICD-10-CM | POA: Diagnosis present

## 2016-05-27 DIAGNOSIS — E785 Hyperlipidemia, unspecified: Secondary | ICD-10-CM | POA: Diagnosis present

## 2016-05-27 DIAGNOSIS — Z888 Allergy status to other drugs, medicaments and biological substances status: Secondary | ICD-10-CM

## 2016-05-27 DIAGNOSIS — E876 Hypokalemia: Secondary | ICD-10-CM | POA: Diagnosis present

## 2016-05-27 DIAGNOSIS — I5033 Acute on chronic diastolic (congestive) heart failure: Secondary | ICD-10-CM | POA: Diagnosis present

## 2016-05-27 DIAGNOSIS — W19XXXD Unspecified fall, subsequent encounter: Secondary | ICD-10-CM | POA: Diagnosis not present

## 2016-05-27 DIAGNOSIS — S72091A Other fracture of head and neck of right femur, initial encounter for closed fracture: Secondary | ICD-10-CM | POA: Diagnosis not present

## 2016-05-27 DIAGNOSIS — W19XXXA Unspecified fall, initial encounter: Secondary | ICD-10-CM | POA: Diagnosis not present

## 2016-05-27 DIAGNOSIS — R109 Unspecified abdominal pain: Secondary | ICD-10-CM

## 2016-05-27 DIAGNOSIS — R7401 Elevation of levels of liver transaminase levels: Secondary | ICD-10-CM

## 2016-05-27 DIAGNOSIS — K8020 Calculus of gallbladder without cholecystitis without obstruction: Secondary | ICD-10-CM | POA: Diagnosis not present

## 2016-05-27 DIAGNOSIS — S7291XA Unspecified fracture of right femur, initial encounter for closed fracture: Secondary | ICD-10-CM | POA: Diagnosis not present

## 2016-05-27 DIAGNOSIS — R278 Other lack of coordination: Secondary | ICD-10-CM | POA: Diagnosis not present

## 2016-05-27 DIAGNOSIS — K5900 Constipation, unspecified: Secondary | ICD-10-CM | POA: Diagnosis present

## 2016-05-27 DIAGNOSIS — Z419 Encounter for procedure for purposes other than remedying health state, unspecified: Secondary | ICD-10-CM | POA: Diagnosis not present

## 2016-05-27 DIAGNOSIS — M6281 Muscle weakness (generalized): Secondary | ICD-10-CM | POA: Diagnosis not present

## 2016-05-27 DIAGNOSIS — I480 Paroxysmal atrial fibrillation: Secondary | ICD-10-CM | POA: Diagnosis not present

## 2016-05-27 DIAGNOSIS — Z7901 Long term (current) use of anticoagulants: Secondary | ICD-10-CM | POA: Diagnosis not present

## 2016-05-27 DIAGNOSIS — Z9181 History of falling: Secondary | ICD-10-CM | POA: Diagnosis not present

## 2016-05-27 DIAGNOSIS — I10 Essential (primary) hypertension: Secondary | ICD-10-CM | POA: Diagnosis not present

## 2016-05-27 DIAGNOSIS — S72001A Fracture of unspecified part of neck of right femur, initial encounter for closed fracture: Principal | ICD-10-CM | POA: Diagnosis present

## 2016-05-27 DIAGNOSIS — K5903 Drug induced constipation: Secondary | ICD-10-CM | POA: Diagnosis not present

## 2016-05-27 DIAGNOSIS — S79911A Unspecified injury of right hip, initial encounter: Secondary | ICD-10-CM | POA: Diagnosis not present

## 2016-05-27 DIAGNOSIS — G8911 Acute pain due to trauma: Secondary | ICD-10-CM | POA: Diagnosis not present

## 2016-05-27 DIAGNOSIS — F0390 Unspecified dementia without behavioral disturbance: Secondary | ICD-10-CM | POA: Diagnosis present

## 2016-05-27 DIAGNOSIS — E039 Hypothyroidism, unspecified: Secondary | ICD-10-CM | POA: Diagnosis present

## 2016-05-27 DIAGNOSIS — M25551 Pain in right hip: Secondary | ICD-10-CM | POA: Diagnosis not present

## 2016-05-27 DIAGNOSIS — Z96649 Presence of unspecified artificial hip joint: Secondary | ICD-10-CM

## 2016-05-27 DIAGNOSIS — Z96641 Presence of right artificial hip joint: Secondary | ICD-10-CM | POA: Diagnosis present

## 2016-05-27 DIAGNOSIS — G20 Parkinson's disease: Secondary | ICD-10-CM | POA: Diagnosis present

## 2016-05-27 DIAGNOSIS — I110 Hypertensive heart disease with heart failure: Secondary | ICD-10-CM | POA: Diagnosis present

## 2016-05-27 DIAGNOSIS — Z471 Aftercare following joint replacement surgery: Secondary | ICD-10-CM | POA: Diagnosis not present

## 2016-05-27 DIAGNOSIS — S72001D Fracture of unspecified part of neck of right femur, subsequent encounter for closed fracture with routine healing: Secondary | ICD-10-CM | POA: Diagnosis not present

## 2016-05-27 DIAGNOSIS — I4891 Unspecified atrial fibrillation: Secondary | ICD-10-CM | POA: Diagnosis present

## 2016-05-27 DIAGNOSIS — R740 Nonspecific elevation of levels of transaminase and lactic acid dehydrogenase [LDH]: Secondary | ICD-10-CM | POA: Diagnosis not present

## 2016-05-27 DIAGNOSIS — K7689 Other specified diseases of liver: Secondary | ICD-10-CM | POA: Diagnosis not present

## 2016-05-27 DIAGNOSIS — Z66 Do not resuscitate: Secondary | ICD-10-CM | POA: Diagnosis present

## 2016-05-27 DIAGNOSIS — D62 Acute posthemorrhagic anemia: Secondary | ICD-10-CM | POA: Diagnosis not present

## 2016-05-27 DIAGNOSIS — D696 Thrombocytopenia, unspecified: Secondary | ICD-10-CM | POA: Diagnosis present

## 2016-05-27 DIAGNOSIS — T148XXA Other injury of unspecified body region, initial encounter: Secondary | ICD-10-CM | POA: Diagnosis not present

## 2016-05-27 DIAGNOSIS — R41841 Cognitive communication deficit: Secondary | ICD-10-CM | POA: Diagnosis not present

## 2016-05-27 DIAGNOSIS — K4090 Unilateral inguinal hernia, without obstruction or gangrene, not specified as recurrent: Secondary | ICD-10-CM | POA: Diagnosis present

## 2016-05-27 DIAGNOSIS — Y92009 Unspecified place in unspecified non-institutional (private) residence as the place of occurrence of the external cause: Secondary | ICD-10-CM

## 2016-05-27 DIAGNOSIS — R2689 Other abnormalities of gait and mobility: Secondary | ICD-10-CM | POA: Diagnosis not present

## 2016-05-27 DIAGNOSIS — R2681 Unsteadiness on feet: Secondary | ICD-10-CM | POA: Diagnosis not present

## 2016-05-27 DIAGNOSIS — S32010A Wedge compression fracture of first lumbar vertebra, initial encounter for closed fracture: Secondary | ICD-10-CM | POA: Diagnosis not present

## 2016-05-27 DIAGNOSIS — S299XXA Unspecified injury of thorax, initial encounter: Secondary | ICD-10-CM | POA: Diagnosis not present

## 2016-05-27 HISTORY — DX: Personal history of (healed) traumatic fracture: Z87.81

## 2016-05-27 LAB — CBC WITH DIFFERENTIAL/PLATELET
Basophils Absolute: 0 10*3/uL (ref 0.0–0.1)
Basophils Relative: 0 %
Eosinophils Absolute: 0.1 10*3/uL (ref 0.0–0.7)
Eosinophils Relative: 1 %
HEMATOCRIT: 40.6 % (ref 36.0–46.0)
Hemoglobin: 13.3 g/dL (ref 12.0–15.0)
LYMPHS ABS: 0.7 10*3/uL (ref 0.7–4.0)
LYMPHS PCT: 8 %
MCH: 31.5 pg (ref 26.0–34.0)
MCHC: 32.8 g/dL (ref 30.0–36.0)
MCV: 96.2 fL (ref 78.0–100.0)
MONO ABS: 0.5 10*3/uL (ref 0.1–1.0)
Monocytes Relative: 6 %
NEUTROS ABS: 7.6 10*3/uL (ref 1.7–7.7)
Neutrophils Relative %: 85 %
Platelets: 141 10*3/uL — ABNORMAL LOW (ref 150–400)
RBC: 4.22 MIL/uL (ref 3.87–5.11)
RDW: 14.5 % (ref 11.5–15.5)
WBC: 8.9 10*3/uL (ref 4.0–10.5)

## 2016-05-27 LAB — COMPREHENSIVE METABOLIC PANEL
ALT: 60 U/L — AB (ref 14–54)
AST: 108 U/L — AB (ref 15–41)
Albumin: 3.3 g/dL — ABNORMAL LOW (ref 3.5–5.0)
Alkaline Phosphatase: 162 U/L — ABNORMAL HIGH (ref 38–126)
Anion gap: 5 (ref 5–15)
BILIRUBIN TOTAL: 1.5 mg/dL — AB (ref 0.3–1.2)
BUN: 17 mg/dL (ref 6–20)
CALCIUM: 8.5 mg/dL — AB (ref 8.9–10.3)
CO2: 27 mmol/L (ref 22–32)
Chloride: 100 mmol/L — ABNORMAL LOW (ref 101–111)
Creatinine, Ser: 0.92 mg/dL (ref 0.44–1.00)
GFR, EST NON AFRICAN AMERICAN: 52 mL/min — AB (ref >60)
GLUCOSE: 135 mg/dL — AB (ref 65–99)
Potassium: 5.6 mmol/L — ABNORMAL HIGH (ref 3.5–5.1)
Sodium: 132 mmol/L — ABNORMAL LOW (ref 135–145)
TOTAL PROTEIN: 5.5 g/dL — AB (ref 6.5–8.1)

## 2016-05-27 LAB — PROTIME-INR
INR: 1.38
Prothrombin Time: 17 seconds — ABNORMAL HIGH (ref 11.4–15.2)

## 2016-05-27 MED ORDER — AMLODIPINE BESYLATE 2.5 MG PO TABS
2.5000 mg | ORAL_TABLET | Freq: Every day | ORAL | Status: DC
  Administered 2016-05-28 – 2016-06-02 (×5): 2.5 mg via ORAL
  Filled 2016-05-27 (×6): qty 1

## 2016-05-27 MED ORDER — CARVEDILOL 6.25 MG PO TABS
6.2500 mg | ORAL_TABLET | Freq: Two times a day (BID) | ORAL | Status: DC
  Administered 2016-05-27 – 2016-06-02 (×6): 6.25 mg via ORAL
  Filled 2016-05-27 (×10): qty 1

## 2016-05-27 MED ORDER — ATORVASTATIN CALCIUM 40 MG PO TABS
40.0000 mg | ORAL_TABLET | Freq: Every day | ORAL | Status: DC
  Administered 2016-05-28 – 2016-06-01 (×4): 40 mg via ORAL
  Filled 2016-05-27 (×4): qty 1

## 2016-05-27 MED ORDER — FENTANYL CITRATE (PF) 100 MCG/2ML IJ SOLN
50.0000 ug | Freq: Once | INTRAMUSCULAR | Status: AC
  Administered 2016-05-27: 50 ug via INTRAVENOUS
  Filled 2016-05-27: qty 2

## 2016-05-27 MED ORDER — LISINOPRIL 40 MG PO TABS
40.0000 mg | ORAL_TABLET | Freq: Every day | ORAL | Status: DC
  Administered 2016-05-28 – 2016-06-02 (×5): 40 mg via ORAL
  Filled 2016-05-27 (×6): qty 1

## 2016-05-27 MED ORDER — FUROSEMIDE 20 MG PO TABS
20.0000 mg | ORAL_TABLET | Freq: Every day | ORAL | Status: DC
  Administered 2016-05-28 – 2016-06-02 (×5): 20 mg via ORAL
  Filled 2016-05-27 (×6): qty 1

## 2016-05-27 MED ORDER — DONEPEZIL HCL 5 MG PO TABS
5.0000 mg | ORAL_TABLET | Freq: Every day | ORAL | Status: DC
  Administered 2016-05-27 – 2016-06-01 (×6): 5 mg via ORAL
  Filled 2016-05-27 (×7): qty 1

## 2016-05-27 MED ORDER — LEVOTHYROXINE SODIUM 50 MCG PO TABS
50.0000 ug | ORAL_TABLET | Freq: Every day | ORAL | Status: DC
  Administered 2016-05-29 – 2016-06-02 (×5): 50 ug via ORAL
  Filled 2016-05-27 (×6): qty 1

## 2016-05-27 MED ORDER — FENTANYL CITRATE (PF) 100 MCG/2ML IJ SOLN: 50.0000 ug | INTRAMUSCULAR | Status: DC | PRN

## 2016-05-27 MED ORDER — FUROSEMIDE 20 MG PO TABS: 20.0000 mg | ORAL_TABLET | Freq: Every day | ORAL | Status: DC

## 2016-05-27 MED ORDER — HYDROCODONE-ACETAMINOPHEN 5-325 MG PO TABS
1.0000 | ORAL_TABLET | Freq: Four times a day (QID) | ORAL | Status: DC | PRN
  Administered 2016-05-27: 1 via ORAL
  Administered 2016-05-28: 2 via ORAL
  Administered 2016-05-29: 1 via ORAL
  Filled 2016-05-27 (×2): qty 1
  Filled 2016-05-27: qty 2

## 2016-05-27 MED ORDER — FUROSEMIDE 10 MG/ML IJ SOLN
20.0000 mg | Freq: Once | INTRAMUSCULAR | Status: AC
  Administered 2016-05-27: 20 mg via INTRAVENOUS
  Filled 2016-05-27: qty 2

## 2016-05-27 MED ORDER — MORPHINE SULFATE (PF) 2 MG/ML IV SOLN
0.5000 mg | INTRAVENOUS | Status: DC | PRN
  Filled 2016-05-27: qty 1

## 2016-05-27 NOTE — ED Notes (Signed)
Patient transported to X-ray 

## 2016-05-27 NOTE — ED Provider Notes (Signed)
[facility]-EMERGENCY DEPT Provider Note   CSN: [id number] Arrival date & time: 05/27/16  1450     History   Chief Complaint Chief Complaint  Patient presents with  . Hip Injury  . Fall    HPI Tammy Branch is a 83 y.o. female.  Patient presents from home via EMS with right hip pain and shortening. Patient had mechanical fall tripping on the carpet per family. No loss of consciousness no seizure activity no head injury. Patient given 100 mics of fentanyl on route for pain control. No history of hip fracture or dislocation. No recent infectious symptoms or fevers. No other new concerns per family patient at baseline with minimal verbal and dementia/Parkinson's.      Past Medical History:  Diagnosis Date  . Cellulitis of left lower leg 11/30/2012  . Dementia   . Diverticulosis of colon   . Hyperlipidemia   . Hypertension   . Hypothyroidism   . ONYCHOMYCOSIS, TOENAILS 02/25/2008   Qualifier: Diagnosis of  By: Cato Mulligan MD, Bruce    . Parkinson disease (HCC)   . Undiagnosed cardiac murmurs    a. 06/2008 Echo: EF nl, mild LVH, mild AI, atrial septal aneurysm, mild PR/TR, mildly increased PASP.    Patient Active Problem List   Diagnosis Date Noted  . Paroxysmal atrial fibrillation (HCC) 04/26/2016  . NSTEMI (non-ST elevated myocardial infarction) (HCC) 03/09/2016  . Accelerated hypertension 03/09/2016  . Hyperlipidemia 03/09/2016  . Hypertension   . Undiagnosed cardiac murmurs 08/19/2014  . Dementia 05/27/2014  . PARKINSON'S DISEASE 06/03/2009  . Hypothyroidism 12/12/2005  . Essential hypertension 12/12/2005    Past Surgical History:  Procedure Laterality Date  . ABDOMINAL HYSTERECTOMY      OB History    No data available       Home Medications    Prior to Admission medications   Medication Sig Start Date End Date Taking? Authorizing Provider  amLODipine (NORVASC) 2.5 MG tablet Take 1 tablet (2.5 mg total) by mouth daily. 05/08/16  Yes [name], DO  apixaban  (ELIQUIS) 2.5 MG TABS tablet Take 1 tablet (2.5 mg total) by mouth 2 (two) times daily. 04/26/16  Yes [name], DO  atorvastatin (LIPITOR) 40 MG tablet Take 1 tablet (40 mg total) by mouth daily at 6 PM. 05/08/16  Yes [name], DO  carvedilol (COREG) 6.25 MG tablet Take 1 tablet (6.25 mg total) by mouth 2 (two) times daily with a meal. 05/08/16  Yes [name], DO  donepezil (ARICEPT) 5 MG tablet Take 1 tablet (5 mg total) by mouth at bedtime. 05/08/16  Yes [name], DO  furosemide (LASIX) 20 MG tablet 20mg  once daily in the morning as needed for swelling in the lgs 05/03/16  Yes [name], DO  levothyroxine (SYNTHROID, LEVOTHROID) 50 MCG tablet TAKE 1 TABLET (50 MCG TOTAL) BY MOUTH DAILY BEFORE BREAKFAST. 02/16/16  Yes [name], DO  lisinopril (PRINIVIL,ZESTRIL) 40 MG tablet TAKE 1 TABLET (40 MG TOTAL) BY MOUTH DAILY. 10/24/15  Yes [name], DO  clindamycin (CLEOCIN) 300 MG capsule Take 1 capsule (300 mg total) by mouth 3 (three) times daily. Patient not taking: Reported on 05/27/2016 05/03/16   [name], DO  traMADol (ULTRAM) 50 MG tablet Take 0.5 tablets (25 mg total) by mouth every 8 (eight) hours as needed for severe pain. Patient not taking: Reported on 05/27/2016 03/11/16   Vassie Loll, MD    Family History Family History  Problem  Relation Age of Onset  . Stroke Mother   . Cancer Father     prostate    Social History Social History  Substance Use Topics  . Smoking status: Never Smoker  . Smokeless tobacco: Never Used  . Alcohol use No     Allergies   Citalopram hydrobromide   Review of Systems Review of Systems  Unable to perform ROS: Patient nonverbal     Physical Exam Updated Vital Signs BP (!) 188/59   Pulse (!) 53   SpO2 97%   Physical Exam  Constitutional: She is oriented to person, place, and time. She appears well-developed and well-nourished.  HENT:  Head: Normocephalic and atraumatic.  Eyes: Conjunctivae are normal. Right eye  exhibits no discharge. Left eye exhibits no discharge.  Neck: Normal range of motion. Neck supple. No tracheal deviation present.  Cardiovascular: Regular rhythm.   Pulmonary/Chest: Effort normal. She has rales (crackles lower bilateral).  Abdominal: Soft. She exhibits no distension. There is no tenderness. There is no guarding.  Musculoskeletal: She exhibits no edema.  Neurological: She is alert and oriented to person, place, and time.  Patient has 2 mm pupils equal bilateral. No significant verbal response. Neck supple. Patient moves upper extremities equal bilateral with mild general weakness as well as left lower extremity. Due to pain patient has minimal movement of the right lower extremity. No other focal tenderness to palpation of extremities except for the right femur in the right hip. No signs of tenderness to palpation of the midline cervical or thoracic spine.  Skin: Skin is warm. No rash noted.  Psychiatric:  Nonverbal, dementia  Nursing note and vitals reviewed.    ED Treatments / Results  Labs (all labs ordered are listed, but only abnormal results are displayed) Labs Reviewed  CBC WITH DIFFERENTIAL/PLATELET  COMPREHENSIVE METABOLIC PANEL  PROTIME-INR    EKG  EKG Interpretation None       Radiology No results found.  Procedures Procedures (including critical care time)  Medications Ordered in ED Medications - No data to display   Initial Impression / Assessment and Plan / ED Course  I have reviewed the triage vital signs and the nursing notes.  Pertinent labs & imaging results that were available during my care of the patient were reviewed by me and considered in my medical decision making (see chart for details).     Pain meds given. Hip fx.  Discussed with ortho on call.  The patients results and plan were reviewed and discussed.   Any x-rays performed were independently reviewed by myself.   Differential diagnosis were considered with the  presenting HPI.  Medications  fentaNYL (SUBLIMAZE) injection 50 mcg ( Intravenous MAR Unhold 05/29/16 2033)  amLODipine (NORVASC) tablet 2.5 mg (2.5 mg Oral Given 05/30/16 1046)  atorvastatin (LIPITOR) tablet 40 mg ( Oral MAR Unhold 05/29/16 2033)  carvedilol (COREG) tablet 6.25 mg (6.25 mg Oral Not Given 05/30/16 0837)  donepezil (ARICEPT) tablet 5 mg (5 mg Oral Given 05/29/16 2137)  levothyroxine (SYNTHROID, LEVOTHROID) tablet 50 mcg (50 mcg Oral Given 05/30/16 0640)  lisinopril (PRINIVIL,ZESTRIL) tablet 40 mg (40 mg Oral Given 05/30/16 1046)  morphine 2 MG/ML injection 0.5 mg ( Intravenous MAR Unhold 05/29/16 2033)  furosemide (LASIX) tablet 20 mg (20 mg Oral Given 05/30/16 1046)  dextrose 5 %-0.9 % sodium chloride infusion ( Intravenous New Bag/Given 05/29/16 1007)  lactated ringers infusion ( Intravenous New Bag/Given 05/29/16 1900)  tranexamic acid (CYKLOKAPRON) 2,000 mg in sodium chloride 0.9 % 50  mL Topical Application (not administered)  lactated ringers infusion ( Intravenous New Bag/Given 05/29/16 1648)  apixaban (ELIQUIS) tablet 2.5 mg (not administered)  ceFAZolin (ANCEF) IVPB 2g/100 mL premix (2 g Intravenous Given 05/30/16 1128)  acetaminophen (TYLENOL) tablet 650 mg (not administered)    Or  acetaminophen (TYLENOL) suppository 650 mg (not administered)  HYDROcodone-acetaminophen (NORCO/VICODIN) 5-325 MG per tablet 1-2 tablet (1 tablet Oral Given 05/30/16 1610)  oxyCODONE (Oxy IR/ROXICODONE) immediate release tablet 5-10 mg (not administered)  morphine 2 MG/ML injection 0.5 mg (not administered)  methocarbamol (ROBAXIN) tablet 500 mg (not administered)    Or  methocarbamol (ROBAXIN) 500 mg in dextrose 5 % 50 mL IVPB (not administered)  ondansetron (ZOFRAN) tablet 4 mg (not administered)    Or  ondansetron (ZOFRAN) injection 4 mg (not administered)  metoCLOPramide (REGLAN) tablet 5-10 mg (not administered)    Or  metoCLOPramide (REGLAN) injection 5-10 mg (not administered)  alum &  mag hydroxide-simeth (MAALOX/MYLANTA) 200-200-20 MG/5ML suspension 30 mL (not administered)  menthol-cetylpyridinium (CEPACOL) lozenge 3 mg (not administered)    Or  phenol (CHLORASEPTIC) mouth spray 1 spray (not administered)  fentaNYL (SUBLIMAZE) injection 50 mcg (50 mcg Intravenous Given 05/27/16 1709)  furosemide (LASIX) injection 20 mg (20 mg Intravenous Given 05/27/16 1819)  ceFAZolin (ANCEF) IVPB 2g/100 mL premix (2 g Intravenous Given 05/29/16 1732)  potassium chloride SA (K-DUR,KLOR-CON) CR tablet 40 mEq (40 mEq Oral Given 05/29/16 2137)  tranexamic acid (CYKLOKAPRON) 1,000 mg in sodium chloride 0.9 % 100 mL IVPB (1,000 mg Intravenous New Bag/Given 05/29/16 1733)    Vitals:   05/30/16 0553 05/30/16 0700 05/30/16 0800 05/30/16 1045  BP: (!) 164/57  (!) 118/30 (!) 142/36  Pulse:   (!) 57 (!) 57  Resp: 16     Temp: 98.7 F (37.1 C)     TempSrc: Oral     SpO2: 99%  98% 97%  Weight:  132 lb 3.2 oz (60 kg)    Height:        Final diagnoses:  Fall, initial encounter  Closed right hip fracture, initial encounter [facility])    Admission/ observation were discussed with the admitting physician, patient and/or family and they are comfortable with the plan.    Final Clinical Impressions(s) / ED Diagnoses   Final diagnoses:  Fall, initial encounter  Closed right hip fracture, initial encounter [facility])    New Prescriptions New Prescriptions   No medications on file     Blane Ohara, MD 05/30/16 1138

## 2016-05-27 NOTE — ED Triage Notes (Addendum)
Pt from home via [facility] with c/o right hip pain with rotation s/p tripping on carpet at home and falling.  Denies LOC, unwitnessed.  Given 100 mcg fentanyl with pain decreased. NAD, A&Ox4.

## 2016-05-27 NOTE — H&P (Addendum)
History and Physical    Tammy Branch MWU:[id number] DOB: [birth year] DOA: 05/27/2016  Referring MD/NP/PA: Dr. Jodi Mourning  PCP: [name]., DO   Patient coming from: home  Chief Complaint: fall and right hip pain   HPI: Tammy Branch is a 83 y.o. female with known HTN, a-fib on eliquis, hypothyroidism, parkinson's, chronic diastolic CHF,  presented to [facility] ED after an episode of fall at home, tripping on the carpet and falling the right side. Now with constant right hip pain, sharp and constant, 7/10 in severity, non radiating, no specific alleviating factors, worse with movement. No fevers, chills, no chest pain, no dyspnea, no specific abd or urinary concerns. No symptoms prior to falls, no seizure like activity, no similar events in the past. Please note most information was provided from family at bedside due to patient's dementia.   ED Course: Pt is hemodynamically stable, VS notable for HR 56, BP 167/54, blood work not completed, CBC only with Plt slightly low 141 K. Imaging studies notable for right hip fracture. Ortho consulted, TRH to admit for further evaluation.   Review of Systems: obtained with assistance from family  Constitutional: Negative for fever, appetite change and fatigue.  HENT: Negative for ear pain, nosebleeds, congestion, facial swelling, rhinorrhea, neck pain Eyes: Negative for pain, discharge, redness, itching and visual disturbance.  Respiratory: Negative for cough, choking, chest tightness, shortness of breath, wheezing and stridor.   Cardiovascular: Negative for chest pain, palpitations and leg swelling.  Gastrointestinal: Negative for abdominal distention.  Genitourinary: Negative for dysuria, urgency, frequency, hematuria, flank pain, decreased urine volume, Musculoskeletal: Negative for back pain, joint swelling, arthralgias Neurological: Negative for dizziness, tremors, seizures, syncope, facial asymmetr Hematological: Negative for adenopathy. Does  not bruise/bleed easily.  Psychiatric/Behavioral: Negative for hallucinations, behavioral problems  Past Medical History:  Diagnosis Date  . Cellulitis of left lower leg 11/30/2012  . Dementia   . Diverticulosis of colon   . Hyperlipidemia   . Hypertension   . Hypothyroidism   . ONYCHOMYCOSIS, TOENAILS 02/25/2008   Qualifier: Diagnosis of  By: Cato Mulligan MD, Bruce    . Parkinson disease (HCC)   . Undiagnosed cardiac murmurs    a. 06/2008 Echo: EF nl, mild LVH, mild AI, atrial septal aneurysm, mild PR/TR, mildly increased PASP.    Past Surgical History:  Procedure Laterality Date  . ABDOMINAL HYSTERECTOMY     Social Hx  reports that she has never smoked. She has never used smokeless tobacco. She reports that she does not drink alcohol or use drugs.  Allergies  Allergen Reactions  . Citalopram Hydrobromide     REACTION: hallucinations    Family History  Problem Relation Age of Onset  . Stroke Mother   . Cancer Father     prostate    Prior to Admission medications   Medication Sig Start Date End Date Taking? Authorizing Provider  amLODipine (NORVASC) 2.5 MG tablet Take 1 tablet (2.5 mg total) by mouth daily. 05/08/16  Yes [name], DO  apixaban (ELIQUIS) 2.5 MG TABS tablet Take 1 tablet (2.5 mg total) by mouth 2 (two) times daily. 04/26/16  Yes [name], DO  atorvastatin (LIPITOR) 40 MG tablet Take 1 tablet (40 mg total) by mouth daily at 6 PM. 05/08/16  Yes [name], DO  carvedilol (COREG) 6.25 MG tablet Take 1 tablet (6.25 mg total) by mouth 2 (two) times daily with a meal. 05/08/16  Yes [name], DO  donepezil (ARICEPT) 5  MG tablet Take 1 tablet (5 mg total) by mouth at bedtime. 05/08/16  Yes [name], DO  furosemide (LASIX) 20 MG tablet 20mg  once daily in the morning as needed for swelling in the lgs 05/03/16  Yes [name], DO  levothyroxine (SYNTHROID, LEVOTHROID) 50 MCG tablet TAKE 1 TABLET (50 MCG TOTAL) BY MOUTH DAILY BEFORE BREAKFAST. 02/16/16  Yes  [name], DO  lisinopril (PRINIVIL,ZESTRIL) 40 MG tablet TAKE 1 TABLET (40 MG TOTAL) BY MOUTH DAILY. 10/24/15  Yes [name], DO  clindamycin (CLEOCIN) 300 MG capsule Take 1 capsule (300 mg total) by mouth 3 (three) times daily. Patient not taking: Reported on 05/27/2016 05/03/16   [name], DO  traMADol (ULTRAM) 50 MG tablet Take 0.5 tablets (25 mg total) by mouth every 8 (eight) hours as needed for severe pain. Patient not taking: Reported on 05/27/2016 03/11/16   Vassie Loll, MD    Physical Exam: Vitals:   05/27/16 1500 05/27/16 1515 05/27/16 1530 05/27/16 1630  BP: (!) 159/49 (!) 188/59 (!) 175/53 (!) 165/89  Pulse: (!) 38 (!) 53 (!) 45 61  Resp:   12 (!) 21  SpO2: 92% 97% 96% 94%    Constitutional: in mild distress due to pain  Vitals:   05/27/16 1500 05/27/16 1515 05/27/16 1530 05/27/16 1630  BP: (!) 159/49 (!) 188/59 (!) 175/53 (!) 165/89  Pulse: (!) 38 (!) 53 (!) 45 61  Resp:   12 (!) 21  SpO2: 92% 97% 96% 94%   Eyes: PERRL, lids and conjunctivae normal ENMT: Mucous membranes are moist. Posterior pharynx clear of any exudate or lesions.Normal dentition.  Neck: normal, supple, no masses, no thyromegaly Respiratory: Normal respiratory effort. No accessory muscle use. Mild bibasilar crackles  Cardiovascular: Regular rate and rhythm, SEM 2/6 / no rubs / no gallops. No extremity edema. 2+ pedal pulses.  Abdomen: no tenderness, no masses palpated. No hepatosplenomegaly. Bowel sounds positive.  Musculoskeletal: no clubbing / cyanosis. TTP at the right hip area.and back area, pain with even minimal movement  Neurologic: Sensation intact, DTR normal. Strength 5/5 in all 4.  Psychiatric: difficult to assess due to dementia and now acute pain   Labs on Admission: I have personally reviewed following labs and imaging studies  CBC:  Recent Labs Lab 05/27/16 1628  WBC 8.9  NEUTROABS 7.6  HGB 13.3  HCT 40.6  MCV 96.2  PLT 141*   Urine analysis:    Component Value  Date/Time   COLORURINE YELLOW 12/23/2014 1942   APPEARANCEUR CLOUDY (A) 12/23/2014 1942   LABSPEC 1.015 12/23/2014 1942   PHURINE 7.0 12/23/2014 1942   GLUCOSEU NEGATIVE 12/23/2014 1942   HGBUR TRACE (A) 12/23/2014 1942   BILIRUBINUR NEGATIVE 12/23/2014 1942   BILIRUBINUR negative 12/06/2014 1507   KETONESUR NEGATIVE 12/23/2014 1942   PROTEINUR NEGATIVE 12/23/2014 1942   UROBILINOGEN 2.0 (H) 12/23/2014 1942   NITRITE NEGATIVE 12/23/2014 1942   LEUKOCYTESUR NEGATIVE 12/23/2014 1942   Radiological Exams on Admission: Dg Chest 1 View  Result Date: 05/27/2016 CLINICAL DATA:  Right hip fracture following a fall. EXAM: CHEST 1 VIEW COMPARISON:  03/09/2016. FINDINGS: Stable enlarged cardiac silhouette. Prominent pulmonary vasculature and interstitial markings with Kerley lines. Small bilateral pleural effusions. Atheromatous aortic calcifications. Thoracic spine degenerative changes. IMPRESSION: 1. Cardiomegaly and changes of congestive heart failure. 2. Aortic atherosclerosis. Electronically Signed   By: Beckie Salts M.D.   On: 05/27/2016 16:16   Dg Hip Unilat  With Pelvis 2-3 Views Right  Result Date: 05/27/2016 CLINICAL DATA:  Right hip pain following a fall on carpet at home. EXAM: DG HIP (WITH OR WITHOUT PELVIS) 2-3V RIGHT COMPARISON:  10/15/2014. FINDINGS: Right femoral neck fracture with varus angulation and mild proximal displacement of the distal fragment. Diffuse osteopenia. Mild lower lumbar spine levoconvex scoliosis and degenerative changes. IMPRESSION: Right femoral neck fracture, as described above. Electronically Signed   By: Beckie Salts M.D.   On: 05/27/2016 16:15    EKG: pending   Assessment/Plan Active Problems: Right femoral neck fracture - admit to tele unit - hold eliquis, provide analgesia as needed - keep NPO after midnight, ortho to see in AM  HTN - resume home medical regimen   Acute on chronic diastolic CHF - mild crackles on exam - resume home regimen with  lasix  - daily weights, strict I/O  Hypothyroidism - continue synthroid   A-fib - keep on tele - hold Eliquis   Dementia - per family at baseline    DVT prophylaxis: SCD's Code Status: Full  Family Communication: Pt and family updated at bedside Disposition Plan: to be determined Consults called: ortho  Admission status: inpatient   Debbora Presto MD [facility] Pager [phone]  If 7PM-7AM, please contact night-coverage [email or web address] Password [email or web address]  05/27/2016, 5:22 PM

## 2016-05-28 ENCOUNTER — Encounter (HOSPITAL_COMMUNITY): Payer: Self-pay | Admitting: General Practice

## 2016-05-28 ENCOUNTER — Inpatient Hospital Stay (HOSPITAL_COMMUNITY): Payer: Medicare Other

## 2016-05-28 DIAGNOSIS — S72001A Fracture of unspecified part of neck of right femur, initial encounter for closed fracture: Secondary | ICD-10-CM

## 2016-05-28 LAB — CBC
HEMATOCRIT: 39.1 % (ref 36.0–46.0)
HEMOGLOBIN: 12.8 g/dL (ref 12.0–15.0)
MCH: 31.1 pg (ref 26.0–34.0)
MCHC: 32.7 g/dL (ref 30.0–36.0)
MCV: 94.9 fL (ref 78.0–100.0)
Platelets: 130 10*3/uL — ABNORMAL LOW (ref 150–400)
RBC: 4.12 MIL/uL (ref 3.87–5.11)
RDW: 14.3 % (ref 11.5–15.5)
WBC: 5.9 10*3/uL (ref 4.0–10.5)

## 2016-05-28 LAB — URINALYSIS, COMPLETE (UACMP) WITH MICROSCOPIC
Bacteria, UA: NONE SEEN
Bilirubin Urine: NEGATIVE
GLUCOSE, UA: NEGATIVE mg/dL
Ketones, ur: NEGATIVE mg/dL
LEUKOCYTES UA: NEGATIVE
Nitrite: NEGATIVE
PH: 8 (ref 5.0–8.0)
Protein, ur: 30 mg/dL — AB
SQUAMOUS EPITHELIAL / LPF: NONE SEEN
Specific Gravity, Urine: 1.005 (ref 1.005–1.030)

## 2016-05-28 LAB — BASIC METABOLIC PANEL
Anion gap: 8 (ref 5–15)
BUN: 14 mg/dL (ref 6–20)
CHLORIDE: 101 mmol/L (ref 101–111)
CO2: 30 mmol/L (ref 22–32)
CREATININE: 0.88 mg/dL (ref 0.44–1.00)
Calcium: 8.4 mg/dL — ABNORMAL LOW (ref 8.9–10.3)
GFR calc Af Amer: >60 mL/min (ref >60)
GFR calc non Af Amer: 55 mL/min — ABNORMAL LOW (ref >60)
Glucose, Bld: 100 mg/dL — ABNORMAL HIGH (ref 65–99)
Potassium: 3.1 mmol/L — ABNORMAL LOW (ref 3.5–5.1)
Sodium: 139 mmol/L (ref 135–145)

## 2016-05-28 LAB — MRSA PCR SCREENING: MRSA by PCR: NEGATIVE

## 2016-05-28 MED ORDER — POTASSIUM CHLORIDE CRYS ER 20 MEQ PO TBCR: 20.0000 meq | EXTENDED_RELEASE_TABLET | Freq: Once | ORAL | Status: DC

## 2016-05-28 MED ORDER — CEFAZOLIN SODIUM-DEXTROSE 2-4 GM/100ML-% IV SOLN
2.0000 g | INTRAVENOUS | Status: AC
Start: 2016-05-29 — End: 2016-05-29
  Administered 2016-05-29: 2 g via INTRAVENOUS
  Filled 2016-05-28: qty 100

## 2016-05-28 MED ORDER — VANCOMYCIN HCL 10 G IV SOLR: 1500.0000 mg | INTRAVENOUS | Status: DC

## 2016-05-28 MED ORDER — VANCOMYCIN HCL IN DEXTROSE 1-5 GM/200ML-% IV SOLN: 1000.0000 mg | INTRAVENOUS | Status: DC

## 2016-05-28 MED ORDER — POVIDONE-IODINE 10 % EX SWAB: 2.0000 "application " | Freq: Once | CUTANEOUS | Status: DC

## 2016-05-28 MED ORDER — DEXTROSE 5 % IV SOLN: 3.0000 g | INTRAVENOUS | Status: DC

## 2016-05-28 NOTE — Consult Note (Signed)
ORTHOPAEDIC CONSULTATION  REQUESTING PHYSICIAN: Theodis Blaze, MD  Chief Complaint: Right femoral neck hip fracture  HPI: Tammy Branch is a 83 y.o. female who presents with right hip fracture s/p mechanical fall PTA.  Patient's dementia limits HPI which is mainly obtained from son in law who is HCPOA.  The patient endorses severe pain in the right hip, that does not radiate, grinding in quality, worse with any movement, better with immobilization.  Denies LOC/fever/chills/nausea/vomiting.  Walks without assistive devices (walker, cane, wheelchair).  Does live with grandson.  Denies LOC, neck pain, abd pain.  Past Medical History:  Diagnosis Date  . Cellulitis of left lower leg 11/30/2012  . Dementia   . Diverticulosis of colon   . Hyperlipidemia   . Hypertension   . Hypothyroidism   . ONYCHOMYCOSIS, TOENAILS 02/25/2008   Qualifier: Diagnosis of  By: Leanne Chang MD, Bruce    . Parkinson disease ([place])   . Undiagnosed cardiac murmurs    a. 06/2008 Echo: EF nl, mild LVH, mild AI, atrial septal aneurysm, mild PR/TR, mildly increased PASP.   Past Surgical History:  Procedure Laterality Date  . ABDOMINAL HYSTERECTOMY     Social History   Social History  . Marital status: Married    Spouse name: N/A  . Number of children: N/A  . Years of education: N/A   Social History Main Topics  . Smoking status: Never Smoker  . Smokeless tobacco: Never Used  . Alcohol use No  . Drug use: No  . Sexual activity: Not Currently   Other Topics Concern  . None   Social History Narrative   Lives locally with family.  Sedentary.   Family History  Problem Relation Age of Onset  . Stroke Mother   . Cancer Father     prostate   Allergies  Allergen Reactions  . Citalopram Hydrobromide     REACTION: hallucinations   Prior to Admission medications   Medication Sig Start Date End Date Taking? Authorizing Provider  amLODipine (NORVASC) 2.5 MG tablet Take 1 tablet (2.5 mg total) by mouth  daily. 05/08/16  Yes Lucretia Kern, DO  apixaban (ELIQUIS) 2.5 MG TABS tablet Take 1 tablet (2.5 mg total) by mouth 2 (two) times daily. 04/26/16  Yes Lucretia Kern, DO  atorvastatin (LIPITOR) 40 MG tablet Take 1 tablet (40 mg total) by mouth daily at 6 PM. 05/08/16  Yes Lucretia Kern, DO  carvedilol (COREG) 6.25 MG tablet Take 1 tablet (6.25 mg total) by mouth 2 (two) times daily with a meal. 05/08/16  Yes Lucretia Kern, DO  donepezil (ARICEPT) 5 MG tablet Take 1 tablet (5 mg total) by mouth at bedtime. 05/08/16  Yes Lucretia Kern, DO  furosemide (LASIX) 20 MG tablet 31m once daily in the morning as needed for swelling in the lgs 05/03/16  Yes HLucretia Kern DO  levothyroxine (SYNTHROID, LEVOTHROID) 50 MCG tablet TAKE 1 TABLET (50 MCG TOTAL) BY MOUTH DAILY BEFORE BREAKFAST. 02/16/16  Yes HLucretia Kern DO  lisinopril (PRINIVIL,ZESTRIL) 40 MG tablet TAKE 1 TABLET (40 MG TOTAL) BY MOUTH DAILY. 10/24/15  Yes HLucretia Kern DO  clindamycin (CLEOCIN) 300 MG capsule Take 1 capsule (300 mg total) by mouth 3 (three) times daily. Patient not taking: Reported on 05/27/2016 05/03/16   HLucretia Kern DO  traMADol (ULTRAM) 50 MG tablet Take 0.5 tablets (25 mg total) by mouth every 8 (eight) hours as needed for severe pain. Patient not taking: Reported  on 05/27/2016 03/11/16   Barton Dubois, MD   Dg Chest 1 View  Result Date: 05/27/2016 CLINICAL DATA:  Right hip fracture following a fall. EXAM: CHEST 1 VIEW COMPARISON:  03/09/2016. FINDINGS: Stable enlarged cardiac silhouette. Prominent pulmonary vasculature and interstitial markings with Kerley lines. Small bilateral pleural effusions. Atheromatous aortic calcifications. Thoracic spine degenerative changes. IMPRESSION: 1. Cardiomegaly and changes of congestive heart failure. 2. Aortic atherosclerosis. Electronically Signed   By: Claudie Revering M.D.   On: 05/27/2016 16:16   Dg Lumbar Spine 2-3 Views  Result Date: 05/27/2016 CLINICAL DATA:  Status post fall with hip fracture. EXAM:  LUMBAR SPINE - 2-3 VIEW COMPARISON:  MRI of the spine 12/23/2014 FINDINGS: There is no evidence of acute lumbar spine fracture. There is compression fracture with severe anterior compression deformity of L1 vertebral body, which appears stable from MRI dated 12/23/2014, accounting for cross modality comparison. There is a stable retropulsion into the spinal canal of the L1 vertebral body. There is an exaggerated kyphosis centered at this level. Mild multilevel osteoarthritic changes is seen. There is generalized osteopenia. Calcific atherosclerotic disease of the aorta noted. IMPRESSION: No evidence of new lumbosacral spine fractures. Stable compression fracture with severe anterior wedge deformity and retropulsion of L1 vertebral body. Generalized osteopenia. Electronically Signed   By: Fidela Salisbury M.D.   On: 05/27/2016 18:20   Dg Hip Unilat  With Pelvis 2-3 Views Right  Result Date: 05/27/2016 CLINICAL DATA:  Right hip pain following a fall on carpet at home. EXAM: DG HIP (WITH OR WITHOUT PELVIS) 2-3V RIGHT COMPARISON:  10/15/2014. FINDINGS: Right femoral neck fracture with varus angulation and mild proximal displacement of the distal fragment. Diffuse osteopenia. Mild lower lumbar spine levoconvex scoliosis and degenerative changes. IMPRESSION: Right femoral neck fracture, as described above. Electronically Signed   By: Claudie Revering M.D.   On: 05/27/2016 16:15    All pertinent xrays, MRI, CT independently reviewed and interpreted  Positive ROS: All other systems have been reviewed and were otherwise negative with the exception of those mentioned in the HPI and as above.  Physical Exam: General: Alert, no acute distress Cardiovascular: No pedal edema Respiratory: No cyanosis, no use of accessory musculature GI: No organomegaly, abdomen is soft and non-tender Skin: No lesions in the area of chief complaint Neurologic: Sensation intact distally Psychiatric: Patient is competent for consent  with normal mood and affect Lymphatic: No axillary or cervical lymphadenopathy  MUSCULOSKELETAL:  - pain with movement of the hip and extremity - skin intact - NVI distally - compartments soft  Assessment: Right femoral neck hip fracture  Plan: - partial hip replacement is recommended, patient and family are aware of r/b/a and wish to proceed - consent obtained - medical optimization per primary team - surgery is planned for Tuesday afternoon to allow eliquis to metabolize - Based on history and fracture pattern this likely represents a fragility fracture. - Fragility fractures affect up to one half of women and one third of men after age 4 years and occur in the setting of bone disorder such as osteoporosis or osteopenia and warrant appropriate work-up. - The following are general recommendations that may serve as an outline for an appropriate work-up:  1.) Obtain bone density measurement to confirm presumptive diagnosis, assess severity of osteoporosis and risk of future fracture, and use as baseline for monitoring treatment  2.) Obtain laboratory tests: CBC, ESR, serum calcium, creatinine, albumin,phosphate, alkaline phosphatase, liver transaminases, protein electrophoresis, urinalysis, 25-hydroxyvitamin D.  3.) Exclude secondary  causes of low bone mass and skeletal fragility (eg,multiple myeloma, lymphoma) as indicated.  4.) Obtain radiograph of thoracic and lumbar spine, particularly among individuals with back pain or height loss to assess presence of vertebral fractures  5.) Intermittent administration of recombinant human parathyroid hormone  6.) Optimize nutritional status using nutritional supplementation.  7.) Patient/family education to prevent future falls.  8.) Early mobilization and exercise program - exercise decreases the rate of bone loss and has been associated with decreased rate of fragility fractures   Thank you for the consult and the opportunity to see  Ms. Seymour Bars, MD [place] 7:57 AM

## 2016-05-28 NOTE — Care Management Note (Signed)
Case Management Note  Patient Details  Name: Tammy Branch MRN: [id number] Date of Birth: Feb 15, 1922  Subjective/Objective:                    Action/Plan: Dementia , lives with grandson, son in law [place], partial hip replacement - surgery is planned for Tuesday afternoon to allow eliquis to metabolize  Will await post op PT/OT evals  Expected Discharge Date:                  Expected Discharge Plan:     In-House Referral:     Discharge planning Services     Post Acute Care Choice:    Choice offered to:     DME Arranged:    DME Agency:     HH Arranged:    HH Agency:     Status of Service:  In process, will continue to follow  If discussed at Long Length of Stay Meetings, dates discussed:    Additional Comments:  Kingsley Plan, RN 05/28/2016, 9:46 AM

## 2016-05-28 NOTE — Progress Notes (Signed)
Patient ID: Tammy Branch, female   DOB: [birth year], 83 y.o.   MRN: [id number]    PROGRESS NOTE    Tammy Branch  Tammy Branch DOB: [birth year] DOA: 05/27/2016  PCP: [name]., DO   Brief Narrative:  83 y.o. female with known HTN, a-fib on eliquis, hypothyroidism, parkinson's, chronic diastolic CHF,  presented to [facility] ED after an episode of fall at home, tripping on the carpet and falling the right side. Now with constant right hip pain, sharp and constant, 7/10 in severity, non radiating, no specific alleviating factors, worse with movement. No fevers, chills, no chest pain, no dyspnea, no specific abd or urinary concerns. No symptoms prior to falls, no seizure like activity, no similar events in the past. Please note most information was provided from family at bedside due to patient's dementia.   ED Course: Pt is hemodynamically stable, VS notable for HR 56, BP 167/54, blood work not completed, CBC only with Plt slightly low 141 K. Imaging studies notable for right hip fracture. Ortho consulted, TRH to admit for further evaluation.    Assessment & Plan:   Assessment/Plan Active Problems: Right femoral neck fracture - hold eliquis, provide analgesia as needed - plan to take to OR in AM - keep NPO after midnight - appreciate ortho team following   Transaminitis - unclear etiology, no specific abd pain - RUQ requested for further evaluation   HTN, essential  - resumed home medical regimen   Thrombocytopenia - mild, possibly reactive - CBC in AM  Acute on chronic diastolic CHF - mild crackles on exam - resumed home regimen with lasix  - daily weights, strict I/O  Hypokalemia - supplement and repeat BMP in AM  Hypothyroidism - continue synthroid   A-fib - keep on tele - no RVR - hold Eliquis   Dementia - per family at baseline    DVT prophylaxis: SCD's Code Status: Full  Family Communication: Patient and son at bedside  Disposition Plan:  likely SNF post op  Consultants:   Ortho  Procedures:   None   Antimicrobials:   None  Subjective: No events overnight, persistent pain in the right hip area.   Objective: Vitals:   05/27/16 1850 05/28/16 0015 05/28/16 0345 05/28/16 0500  BP: (!) 174/77  (!) 161/49   Pulse: 63 64 (!) 57   Resp: 16 16 16    Temp: 97.7 F (36.5 C) 97.4 F (36.3 C) 97.9 F (36.6 C)   TempSrc: Oral Oral Oral   SpO2: 94% 95% 97%   Weight:    55.5 kg (122 lb 6.4 oz)  Height:    5\' 6"  (1.676 m)    Intake/Output Summary (Last 24 hours) at 05/28/16 1246 Last data filed at 05/28/16 0344  Gross per 24 hour  Intake                0 ml  Output              250 ml  Net             -250 ml   Filed Weights   05/28/16 0500  Weight: 55.5 kg (122 lb 6.4 oz)    Examination:  General exam: Appears calm, NAD Respiratory system: Respiratory effort normal. Diminished breath sounds at bases  Cardiovascular system: IRRR. No JVD, rubs, gallops or clicks. No pedal edema. Gastrointestinal system: Abdomen is nondistended, soft and nontender. No organomegaly or masses felt.  Extremities: TTP in the right hip area  Data Reviewed: I have personally reviewed following labs and imaging studies  CBC:  Recent Labs Lab 05/27/16 1628 05/28/16 0518  WBC 8.9 5.9  NEUTROABS 7.6  --   HGB 13.3 12.8  HCT 40.6 39.1  MCV 96.2 94.9  PLT 141* 130*   Basic Metabolic Panel:  Recent Labs Lab 05/27/16 1628 05/28/16 0518  NA 132* 139  K 5.6* 3.1*  CL 100* 101  CO2 27 30  GLUCOSE 135* 100*  BUN 17 14  CREATININE 0.92 0.88  CALCIUM 8.5* 8.4*   Liver Function Tests:  Recent Labs Lab 05/27/16 1628  AST 108*  ALT 60*  ALKPHOS 162*  BILITOT 1.5*  PROT 5.5*  ALBUMIN 3.3*   Coagulation Profile:  Recent Labs Lab 05/27/16 1715  INR 1.38   Urine analysis:    Component Value Date/Time   COLORURINE STRAW (A) 05/27/2016 2347   APPEARANCEUR CLEAR 05/27/2016 2347   LABSPEC 1.005 05/27/2016 2347     PHURINE 8.0 05/27/2016 2347   GLUCOSEU NEGATIVE 05/27/2016 2347   HGBUR SMALL (A) 05/27/2016 2347   BILIRUBINUR NEGATIVE 05/27/2016 2347   BILIRUBINUR negative 12/06/2014 1507   KETONESUR NEGATIVE 05/27/2016 2347   PROTEINUR 30 (A) 05/27/2016 2347   UROBILINOGEN 2.0 (H) 12/23/2014 1942   NITRITE NEGATIVE 05/27/2016 2347   LEUKOCYTESUR NEGATIVE 05/27/2016 2347   Recent Results (from the past 240 hour(s))  MRSA PCR Screening     Status: None   Collection Time: 05/27/16 11:59 PM  Result Value Ref Range Status   MRSA by PCR NEGATIVE NEGATIVE Final    Comment:        The GeneXpert MRSA Assay (FDA approved for NASAL specimens only), is one component of a comprehensive MRSA colonization surveillance program. It is not intended to diagnose MRSA infection nor to guide or monitor treatment for MRSA infections.       Radiology Studies: Dg Chest 1 View  Result Date: 05/27/2016 CLINICAL DATA:  Right hip fracture following a fall. EXAM: CHEST 1 VIEW COMPARISON:  03/09/2016. FINDINGS: Stable enlarged cardiac silhouette. Prominent pulmonary vasculature and interstitial markings with Kerley lines. Small bilateral pleural effusions. Atheromatous aortic calcifications. Thoracic spine degenerative changes. IMPRESSION: 1. Cardiomegaly and changes of congestive heart failure. 2. Aortic atherosclerosis. Electronically Signed   By: Beckie Salts M.D.   On: 05/27/2016 16:16   Dg Lumbar Spine 2-3 Views  Result Date: 05/27/2016 CLINICAL DATA:  Status post fall with hip fracture. EXAM: LUMBAR SPINE - 2-3 VIEW COMPARISON:  MRI of the spine 12/23/2014 FINDINGS: There is no evidence of acute lumbar spine fracture. There is compression fracture with severe anterior compression deformity of L1 vertebral body, which appears stable from MRI dated 12/23/2014, accounting for cross modality comparison. There is a stable retropulsion into the spinal canal of the L1 vertebral body. There is an exaggerated kyphosis  centered at this level. Mild multilevel osteoarthritic changes is seen. There is generalized osteopenia. Calcific atherosclerotic disease of the aorta noted. IMPRESSION: No evidence of new lumbosacral spine fractures. Stable compression fracture with severe anterior wedge deformity and retropulsion of L1 vertebral body. Generalized osteopenia. Electronically Signed   By: Ted Mcalpine M.D.   On: 05/27/2016 18:20   Dg Hip Unilat  With Pelvis 2-3 Views Right  Result Date: 05/27/2016 CLINICAL DATA:  Right hip pain following a fall on carpet at home. EXAM: DG HIP (WITH OR WITHOUT PELVIS) 2-3V RIGHT COMPARISON:  10/15/2014. FINDINGS: Right femoral neck fracture with varus angulation and mild proximal displacement of the  distal fragment. Diffuse osteopenia. Mild lower lumbar spine levoconvex scoliosis and degenerative changes. IMPRESSION: Right femoral neck fracture, as described above. Electronically Signed   By: Beckie Salts M.D.   On: 05/27/2016 16:15   Scheduled Meds: . amLODipine  2.5 mg Oral Daily  . atorvastatin  40 mg Oral q1800  . carvedilol  6.25 mg Oral BID WC  . [START ON 05/29/2016]  ceFAZolin (ANCEF) IV  2 g Intravenous To SS-Surg  . donepezil  5 mg Oral QHS  . furosemide  20 mg Oral Daily  . levothyroxine  50 mcg Oral QAC breakfast  . lisinopril  40 mg Oral Daily  . povidone-iodine  2 application Topical Once   Continuous Infusions:   LOS: 1 day   Time spent: 20 minutes   Debbora Presto, MD [facility] Pager [phone]  If 7PM-7AM, please contact night-coverage [email or web address] Password [email or web address] 05/28/2016, 12:46 PM

## 2016-05-28 NOTE — Progress Notes (Signed)
Consent signed by son-in-law -POA

## 2016-05-28 NOTE — Progress Notes (Signed)
Initial Nutrition Assessment  DOCUMENTATION CODES:   Not applicable  INTERVENTION:  - Magic Cup po BID, each supplement provides 290 calories and 9 grams protein  NUTRITION DIAGNOSIS:   Increased nutrient needs related to acute illness as evidenced by estimated needs.  GOAL:   Patient will meet greater than or equal to 90% of their needs  MONITOR:   PO intake, Supplement acceptance, I & O's, Weight trends, Labs  REASON FOR ASSESSMENT:   Consult Assessment of nutrition requirement/status  ASSESSMENT:   83 y.o. female with PMH of HTN, a-fib on eliquis, hypothyroidism, parkinson's, chronic diastolic CHF,  presented after an episode of fall at home, tripping on the carpet and falling the right side. Now with constant right hip pain, sharp and constant, 7/10 in severity, non radiating, no specific alleviating factors, worse with movement.  Pt's son in law at bedside and assisted in collecting pt history as pt did not communicate  Pt's family reports a fairly stable weight status. Reports a UBW for patient ~ 130 lbs, and reports she weighed this 3 weeks ago. Per chart review pt has experienced fluctuations in weight over the past year, could be related to CHF and fluid fluctuations.  Wt Readings from Last 10 Encounters:  05/28/16 122 lb 6.4 oz (55.5 kg)  04/26/16 132 lb 3.2 oz (60 kg)  03/11/16 115 lb 4.8 oz (52.3 kg)  01/30/16 118 lb 12.8 oz (53.9 kg)  04/21/15 126 lb (57.2 kg)   Pt has been NPO so no meal completion percentages available. Per pt's son-in-law pt never misses a meal and has a great appetite. He reports "missing" breakfast because pt's diet was advanced to soft today but states she ate her applesauce and was deciding what to order her for lunch  Pt's son-in-law reports she does not consume nutritional supplements at home because she has a good appetite.   Pt's son-in-law reports pt receives the service meals on wheels and eats the food as long as it is  transferred to a "real plate"  Pt's son-in-law reports pt likes ice cream and is willing to supplement with magic cups during admission.   Labs reviewed; K (3.1) Medications reviewed; 20 mg Lasix   Nutrition-focused physical exam completed but was limited d/t pt's pain s/p fall. Findings include mild fat depletion, mild muscle depletion, and no edema. Depletions could be attributed to age-related sarcopenia.   Diet Order:  Diet NPO time specified Except for: Sips with Meds Diet NPO time specified DIET SOFT Room service appropriate? Yes; Fluid consistency: Thin  Skin:  Reviewed, no issues  Last BM:  No BM Recorded  Height:   Ht Readings from Last 1 Encounters:  05/28/16 5\' 6"  (1.676 m)    Weight:   Wt Readings from Last 1 Encounters:  05/28/16 122 lb 6.4 oz (55.5 kg)    Ideal Body Weight:  59 kg  BMI:  Body mass index is 19.76 kg/m.  Estimated Nutritional Needs:   Kcal:  1300-1500  Protein:  65-75 grams  Fluid:  >/= 1.3 L/d  EDUCATION NEEDS:   Education needs no appropriate at this time  [employer] Intern

## 2016-05-29 ENCOUNTER — Inpatient Hospital Stay (HOSPITAL_COMMUNITY): Payer: Medicare Other

## 2016-05-29 ENCOUNTER — Inpatient Hospital Stay (HOSPITAL_COMMUNITY): Payer: Medicare Other | Admitting: Certified Registered Nurse Anesthetist

## 2016-05-29 ENCOUNTER — Encounter (HOSPITAL_COMMUNITY)
Admission: EM | Disposition: A | Discharge status: Skilled Nursing Facility | Payer: Self-pay | Source: Home / Self Care | Attending: Family Medicine

## 2016-05-29 DIAGNOSIS — S72001A Fracture of unspecified part of neck of right femur, initial encounter for closed fracture: Secondary | ICD-10-CM

## 2016-05-29 HISTORY — PX: ANTERIOR APPROACH HEMI HIP ARTHROPLASTY: SHX6690

## 2016-05-29 LAB — CBC
HCT: 37 % (ref 36.0–46.0)
Hemoglobin: 12 g/dL (ref 12.0–15.0)
MCH: 30.6 pg (ref 26.0–34.0)
MCHC: 32.4 g/dL (ref 30.0–36.0)
MCV: 94.4 fL (ref 78.0–100.0)
PLATELETS: 123 10*3/uL — AB (ref 150–400)
RBC: 3.92 MIL/uL (ref 3.87–5.11)
RDW: 14.2 % (ref 11.5–15.5)
WBC: 5.2 10*3/uL (ref 4.0–10.5)

## 2016-05-29 LAB — COMPREHENSIVE METABOLIC PANEL
ALBUMIN: 2.5 g/dL — AB (ref 3.5–5.0)
ALT: 33 U/L (ref 14–54)
AST: 40 U/L (ref 15–41)
Alkaline Phosphatase: 94 U/L (ref 38–126)
Anion gap: 8 (ref 5–15)
BUN: 17 mg/dL (ref 6–20)
CALCIUM: 8.1 mg/dL — AB (ref 8.9–10.3)
CHLORIDE: 99 mmol/L — AB (ref 101–111)
CO2: 29 mmol/L (ref 22–32)
CREATININE: 1.05 mg/dL — AB (ref 0.44–1.00)
GFR calc Af Amer: 52 mL/min — ABNORMAL LOW (ref >60)
GFR, EST NON AFRICAN AMERICAN: 45 mL/min — AB (ref >60)
Glucose, Bld: 83 mg/dL (ref 65–99)
Potassium: 3.3 mmol/L — ABNORMAL LOW (ref 3.5–5.1)
SODIUM: 136 mmol/L (ref 135–145)
Total Bilirubin: 1.6 mg/dL — ABNORMAL HIGH (ref 0.3–1.2)
Total Protein: 4.7 g/dL — ABNORMAL LOW (ref 6.5–8.1)

## 2016-05-29 LAB — URINE CULTURE: CULTURE: NO GROWTH

## 2016-05-29 SURGERY — HEMIARTHROPLASTY, HIP, DIRECT ANTERIOR APPROACH, FOR FRACTURE
Anesthesia: General | Laterality: Right

## 2016-05-29 MED ORDER — LACTATED RINGERS IV SOLN
INTRAVENOUS | Status: DC
  Administered 2016-05-29: 17:00:00 via INTRAVENOUS

## 2016-05-29 MED ORDER — EPHEDRINE 5 MG/ML INJ
INTRAVENOUS | Status: AC
  Filled 2016-05-29: qty 10

## 2016-05-29 MED ORDER — 0.9 % SODIUM CHLORIDE (POUR BTL) OPTIME
TOPICAL | Status: DC | PRN
  Administered 2016-05-29: 1000 mL

## 2016-05-29 MED ORDER — HYDROCODONE-ACETAMINOPHEN 5-325 MG PO TABS
1.0000 | ORAL_TABLET | Freq: Four times a day (QID) | ORAL | Status: DC | PRN
  Administered 2016-05-30 (×2): 1 via ORAL
  Administered 2016-05-31: 2 via ORAL
  Administered 2016-05-31: 1 via ORAL
  Administered 2016-05-31: 2 via ORAL
  Administered 2016-06-01: 1 via ORAL
  Administered 2016-06-01 – 2016-06-02 (×3): 2 via ORAL
  Filled 2016-05-29: qty 2
  Filled 2016-05-29 (×3): qty 1
  Filled 2016-05-29: qty 2
  Filled 2016-05-29 (×3): qty 1
  Filled 2016-05-29 (×3): qty 2

## 2016-05-29 MED ORDER — FENTANYL CITRATE (PF) 100 MCG/2ML IJ SOLN
INTRAMUSCULAR | Status: AC
  Filled 2016-05-29: qty 2

## 2016-05-29 MED ORDER — LACTATED RINGERS IV SOLN
INTRAVENOUS | Status: DC
  Administered 2016-05-29 – 2016-05-31 (×3): via INTRAVENOUS

## 2016-05-29 MED ORDER — TRANEXAMIC ACID 1000 MG/10ML IV SOLN
2000.0000 mg | INTRAVENOUS | Status: AC
  Filled 2016-05-29: qty 20

## 2016-05-29 MED ORDER — CEFAZOLIN SODIUM-DEXTROSE 2-4 GM/100ML-% IV SOLN
2.0000 g | Freq: Four times a day (QID) | INTRAVENOUS | Status: AC
  Administered 2016-05-30 (×3): 2 g via INTRAVENOUS
  Filled 2016-05-29 (×3): qty 100

## 2016-05-29 MED ORDER — METOCLOPRAMIDE HCL 5 MG/ML IJ SOLN: 5.0000 mg | Freq: Three times a day (TID) | INTRAMUSCULAR | Status: DC | PRN

## 2016-05-29 MED ORDER — SODIUM CHLORIDE 0.9 % IV SOLN
INTRAVENOUS | Status: DC
  Administered 2016-05-29: 125 mL/h via INTRAVENOUS

## 2016-05-29 MED ORDER — ALUM & MAG HYDROXIDE-SIMETH 200-200-20 MG/5ML PO SUSP: 30.0000 mL | ORAL | Status: DC | PRN

## 2016-05-29 MED ORDER — HYDROCODONE-ACETAMINOPHEN 7.5-325 MG PO TABS: 1.0000 | ORAL_TABLET | Freq: Four times a day (QID) | ORAL | 0 refills | Status: AC | PRN

## 2016-05-29 MED ORDER — FENTANYL CITRATE (PF) 100 MCG/2ML IJ SOLN: 25.0000 ug | INTRAMUSCULAR | Status: DC | PRN

## 2016-05-29 MED ORDER — GLYCOPYRROLATE 0.2 MG/ML IJ SOLN
INTRAMUSCULAR | Status: DC | PRN
  Administered 2016-05-29: 0.2 mg via INTRAVENOUS

## 2016-05-29 MED ORDER — SUGAMMADEX SODIUM 200 MG/2ML IV SOLN
INTRAVENOUS | Status: DC | PRN
  Administered 2016-05-29: 200 mg via INTRAVENOUS

## 2016-05-29 MED ORDER — FENTANYL CITRATE (PF) 100 MCG/2ML IJ SOLN
INTRAMUSCULAR | Status: DC | PRN
  Administered 2016-05-29 (×4): 50 ug via INTRAVENOUS

## 2016-05-29 MED ORDER — TRANEXAMIC ACID 1000 MG/10ML IV SOLN
INTRAVENOUS | Status: DC | PRN
  Administered 2016-05-29: 2000 mg via TOPICAL

## 2016-05-29 MED ORDER — POTASSIUM CHLORIDE CRYS ER 20 MEQ PO TBCR
40.0000 meq | EXTENDED_RELEASE_TABLET | Freq: Two times a day (BID) | ORAL | Status: AC
  Administered 2016-05-29 (×2): 40 meq via ORAL
  Filled 2016-05-29 (×2): qty 2

## 2016-05-29 MED ORDER — ONDANSETRON HCL 4 MG/2ML IJ SOLN
INTRAMUSCULAR | Status: AC
  Filled 2016-05-29: qty 2

## 2016-05-29 MED ORDER — SODIUM CHLORIDE 0.9 % IJ SOLN
INTRAMUSCULAR | Status: AC
  Filled 2016-05-29: qty 10

## 2016-05-29 MED ORDER — ACETAMINOPHEN 650 MG RE SUPP: 650.0000 mg | Freq: Four times a day (QID) | RECTAL | Status: DC | PRN

## 2016-05-29 MED ORDER — ONDANSETRON HCL 4 MG PO TABS: 4.0000 mg | ORAL_TABLET | Freq: Four times a day (QID) | ORAL | Status: DC | PRN

## 2016-05-29 MED ORDER — SUFENTANIL CITRATE 50 MCG/ML IV SOLN
INTRAVENOUS | Status: AC
  Filled 2016-05-29: qty 1

## 2016-05-29 MED ORDER — DEXTROSE-NACL 5-0.9 % IV SOLN
INTRAVENOUS | Status: DC
  Administered 2016-05-29: 10:00:00 via INTRAVENOUS

## 2016-05-29 MED ORDER — SUGAMMADEX SODIUM 200 MG/2ML IV SOLN
INTRAVENOUS | Status: AC
  Filled 2016-05-29: qty 2

## 2016-05-29 MED ORDER — MORPHINE SULFATE (PF) 2 MG/ML IV SOLN
0.5000 mg | INTRAVENOUS | Status: DC | PRN
  Administered 2016-05-31: 0.5 mg via INTRAVENOUS

## 2016-05-29 MED ORDER — EPHEDRINE SULFATE 50 MG/ML IJ SOLN
INTRAMUSCULAR | Status: DC | PRN
  Administered 2016-05-29: 15 mg via INTRAVENOUS
  Administered 2016-05-29: 10 mg via INTRAVENOUS

## 2016-05-29 MED ORDER — ONDANSETRON HCL 4 MG/2ML IJ SOLN: 4.0000 mg | Freq: Four times a day (QID) | INTRAMUSCULAR | Status: DC | PRN

## 2016-05-29 MED ORDER — ACETAMINOPHEN 325 MG PO TABS
650.0000 mg | ORAL_TABLET | Freq: Four times a day (QID) | ORAL | Status: DC | PRN
  Administered 2016-06-01: 650 mg via ORAL
  Filled 2016-05-29: qty 2

## 2016-05-29 MED ORDER — METOCLOPRAMIDE HCL 5 MG PO TABS: 5.0000 mg | ORAL_TABLET | Freq: Three times a day (TID) | ORAL | Status: DC | PRN

## 2016-05-29 MED ORDER — METHOCARBAMOL 500 MG PO TABS: 500.0000 mg | ORAL_TABLET | Freq: Four times a day (QID) | ORAL | Status: DC | PRN

## 2016-05-29 MED ORDER — DEXTROSE 5 % IV SOLN
500.0000 mg | Freq: Four times a day (QID) | INTRAVENOUS | Status: DC | PRN
  Filled 2016-05-29: qty 5

## 2016-05-29 MED ORDER — TRANEXAMIC ACID 1000 MG/10ML IV SOLN
1000.0000 mg | INTRAVENOUS | Status: AC
  Administered 2016-05-29: 1000 mg via INTRAVENOUS
  Filled 2016-05-29: qty 10

## 2016-05-29 MED ORDER — OXYCODONE HCL 5 MG PO TABS: 5.0000 mg | ORAL_TABLET | Freq: Once | ORAL | Status: DC | PRN

## 2016-05-29 MED ORDER — PHENOL 1.4 % MT LIQD: 1.0000 | OROMUCOSAL | Status: DC | PRN

## 2016-05-29 MED ORDER — ROCURONIUM BROMIDE 100 MG/10ML IV SOLN
INTRAVENOUS | Status: DC | PRN
  Administered 2016-05-29: 50 mg via INTRAVENOUS

## 2016-05-29 MED ORDER — APIXABAN 2.5 MG PO TABS
2.5000 mg | ORAL_TABLET | Freq: Two times a day (BID) | ORAL | Status: DC
  Administered 2016-05-30 – 2016-05-31 (×3): 2.5 mg via ORAL
  Filled 2016-05-29 (×3): qty 1

## 2016-05-29 MED ORDER — OXYCODONE HCL 5 MG PO TABS
5.0000 mg | ORAL_TABLET | ORAL | Status: DC | PRN
  Filled 2016-05-29: qty 1

## 2016-05-29 MED ORDER — LIDOCAINE HCL (CARDIAC) 20 MG/ML IV SOLN
INTRAVENOUS | Status: DC | PRN
  Administered 2016-05-29: 60 mg via INTRATRACHEAL

## 2016-05-29 MED ORDER — PROPOFOL 10 MG/ML IV BOLUS
INTRAVENOUS | Status: AC
  Filled 2016-05-29: qty 20

## 2016-05-29 MED ORDER — SODIUM CHLORIDE 0.9 % IR SOLN
Status: DC | PRN
  Administered 2016-05-29: 3000 mL

## 2016-05-29 MED ORDER — OXYCODONE HCL 5 MG/5ML PO SOLN: 5.0000 mg | Freq: Once | ORAL | Status: DC | PRN

## 2016-05-29 MED ORDER — ONDANSETRON HCL 4 MG/2ML IJ SOLN
INTRAMUSCULAR | Status: DC | PRN
  Administered 2016-05-29: 4 mg via INTRAVENOUS

## 2016-05-29 MED ORDER — MENTHOL 3 MG MT LOZG: 1.0000 | LOZENGE | OROMUCOSAL | Status: DC | PRN

## 2016-05-29 MED ORDER — PROPOFOL 10 MG/ML IV BOLUS
INTRAVENOUS | Status: DC | PRN
  Administered 2016-05-29: 90 mg via INTRAVENOUS

## 2016-05-29 SURGICAL SUPPLY — 57 items
BAG DECANTER FOR FLEXI CONT (MISCELLANEOUS) ×3 IMPLANT
CAPT HIP HEMI 2 ×2 IMPLANT
CELLS DAT CNTRL 66122 CELL SVR (MISCELLANEOUS) IMPLANT
COVER SURGICAL LIGHT HANDLE (MISCELLANEOUS) ×3 IMPLANT
DRAPE C-ARM 42X72 X-RAY (DRAPES) ×3 IMPLANT
DRAPE STERI IOBAN 125X83 (DRAPES) ×3 IMPLANT
DRAPE U-SHAPE 47X51 STRL (DRAPES) ×6 IMPLANT
DRSG AQUACEL AG ADV 3.5X10 (GAUZE/BANDAGES/DRESSINGS) ×3 IMPLANT
DURAPREP 26ML APPLICATOR (WOUND CARE) ×3 IMPLANT
ELECT BLADE 4.0 EZ CLEAN MEGAD (MISCELLANEOUS) ×3
ELECT REM PT RETURN 9FT ADLT (ELECTROSURGICAL) ×3
ELECTRODE BLDE 4.0 EZ CLN MEGD (MISCELLANEOUS) ×1 IMPLANT
ELECTRODE REM PT RTRN 9FT ADLT (ELECTROSURGICAL) ×1 IMPLANT
GLOVE BIO SURGEON STRL SZ7 (GLOVE) ×4 IMPLANT
GLOVE BIOGEL PI IND STRL 6.5 (GLOVE) IMPLANT
GLOVE BIOGEL PI IND STRL 7.5 (GLOVE) IMPLANT
GLOVE BIOGEL PI INDICATOR 6.5 (GLOVE) ×4
GLOVE BIOGEL PI INDICATOR 7.5 (GLOVE) ×2
GLOVE SKINSENSE NS SZ7.5 (GLOVE) ×2
GLOVE SKINSENSE STRL SZ7.5 (GLOVE) ×1 IMPLANT
GLOVE SURG SS PI 6.5 STRL IVOR (GLOVE) ×2 IMPLANT
GLOVE SURG SYN 7.5  E (GLOVE) ×4
GLOVE SURG SYN 7.5 E (GLOVE) ×2 IMPLANT
GLOVE SURG SYN 7.5 PF PI (GLOVE) ×2 IMPLANT
GOWN SRG XL XLNG 56XLVL 4 (GOWN DISPOSABLE) ×1 IMPLANT
GOWN STRL NON-REIN XL XLG LVL4 (GOWN DISPOSABLE) ×3
GOWN STRL REUS W/ TWL LRG LVL3 (GOWN DISPOSABLE) IMPLANT
GOWN STRL REUS W/TWL LRG LVL3 (GOWN DISPOSABLE) ×6
HANDPIECE INTERPULSE COAX TIP (DISPOSABLE) ×3
HOOD PEEL AWAY FLYTE STAYCOOL (MISCELLANEOUS) ×6 IMPLANT
IV NS 1000ML (IV SOLUTION)
IV NS 1000ML BAXH (IV SOLUTION) ×1 IMPLANT
IV NS IRRIG 3000ML ARTHROMATIC (IV SOLUTION) ×3 IMPLANT
KIT BASIN OR (CUSTOM PROCEDURE TRAY) ×3 IMPLANT
MARKER SKIN DUAL TIP RULER LAB (MISCELLANEOUS) ×3 IMPLANT
NDL SPNL 18GX3.5 QUINCKE PK (NEEDLE) ×1 IMPLANT
NEEDLE SPNL 18GX3.5 QUINCKE PK (NEEDLE) ×3 IMPLANT
PACK TOTAL JOINT (CUSTOM PROCEDURE TRAY) ×3 IMPLANT
PACK UNIVERSAL I (CUSTOM PROCEDURE TRAY) ×3 IMPLANT
RETRACTOR WND ALEXIS 18 MED (MISCELLANEOUS) ×1 IMPLANT
RTRCTR WOUND ALEXIS 18CM MED (MISCELLANEOUS)
RTRCTR WOUND ALEXIS 18CM SML (INSTRUMENTS) ×3
SAVER CELL AAL HAEMONETICS (INSTRUMENTS) IMPLANT
SAW OSC TIP CART 19.5X105X1.3 (SAW) ×3 IMPLANT
SEALER BIPOLAR AQUA 6.0 (INSTRUMENTS) ×3 IMPLANT
SET HNDPC FAN SPRY TIP SCT (DISPOSABLE) ×1 IMPLANT
STAPLER VISISTAT 35W (STAPLE) IMPLANT
SUT ETHIBOND 2 V 37 (SUTURE) ×3 IMPLANT
SUT VIC AB 1 CT1 27 (SUTURE) ×3
SUT VIC AB 1 CT1 27XBRD ANBCTR (SUTURE) ×1 IMPLANT
SUT VIC AB 2-0 CT1 27 (SUTURE) ×3
SUT VIC AB 2-0 CT1 TAPERPNT 27 (SUTURE) ×1 IMPLANT
SYR 20CC LL (SYRINGE) ×3 IMPLANT
SYR 50ML LL SCALE MARK (SYRINGE) ×3 IMPLANT
TOWEL OR 17X26 10 PK STRL BLUE (TOWEL DISPOSABLE) ×3 IMPLANT
TRAY CATH 16FR W/PLASTIC CATH (SET/KITS/TRAYS/PACK) ×3 IMPLANT
YANKAUER SUCT BULB TIP NO VENT (SUCTIONS) ×3 IMPLANT

## 2016-05-29 NOTE — H&P (Signed)
H&P update  The surgical history has been reviewed and remains accurate without interval change.  The patient was re-examined and patient's physiologic condition has not changed significantly in the last 30 days. The condition still exists that makes this procedure necessary. The treatment plan remains the same, without new options for care.  No new pharmacological allergies or types of therapy has been initiated that would change the plan or the appropriateness of the plan.  The patient and/or family understand the potential benefits and risks.  Mayra Reel, MD 05/29/2016 5:11 PM

## 2016-05-29 NOTE — Anesthesia Preprocedure Evaluation (Signed)
Anesthesia Evaluation  Patient identified by MRN, date of birth, ID band Patient awake    Reviewed: Allergy & Precautions, H&P , NPO status , Patient's Chart, lab work & pertinent test results  Airway Mallampati: II   Neck ROM: full    Dental   Pulmonary neg pulmonary ROS,    breath sounds clear to auscultation       Cardiovascular hypertension, + dysrhythmias Atrial Fibrillation  Rhythm:regular Rate:Normal  Takes eliquis   Neuro/Psych Parkinson's dz. Dementia    GI/Hepatic   Endo/Other  Hypothyroidism   Renal/GU      Musculoskeletal   Abdominal   Peds  Hematology   Anesthesia Other Findings   Reproductive/Obstetrics                             Anesthesia Physical Anesthesia Plan  ASA: III  Anesthesia Plan: General   Post-op Pain Management:    Induction: Intravenous  Airway Management Planned: Oral ETT  Additional Equipment:   Intra-op Plan:   Post-operative Plan: Extubation in OR and Possible Post-op intubation/ventilation  Informed Consent: I have reviewed the patients History and Physical, chart, labs and discussed the procedure including the risks, benefits and alternatives for the proposed anesthesia with the patient or authorized representative who has indicated his/her understanding and acceptance.     Plan Discussed with: CRNA, Anesthesiologist and Surgeon  Anesthesia Plan Comments:         Anesthesia Quick Evaluation

## 2016-05-29 NOTE — Progress Notes (Signed)
PT has plan of care listed as Telemetry. Paged [facility] To get order for Telemetry. Received order/instruction: The tele order was discontinued 3/18. Will cont. To monitor pt.

## 2016-05-29 NOTE — Transfer of Care (Signed)
Immediate Anesthesia Transfer of Care Note  Patient: Tammy Branch  Procedure(s) Performed: Procedure(s): ANTERIOR APPROACH RIGHT HEMI HIP ARTHROPLASTY (Right)  Patient Location: PACU  Anesthesia Type:General  Level of Consciousness: awake  Airway & Oxygen Therapy: Patient Spontanous Breathing and Patient connected to face mask oxygen  Post-op Assessment: Report given to RN and Post -op Vital signs reviewed and stable  Post vital signs: Reviewed and stable  Last Vitals:  Vitals:   05/29/16 1000 05/29/16 1300  BP: (!) 149/36 (!) 159/39  Pulse:  (!) 50  Resp:    Temp:  36.7 C    Last Pain:  Vitals:   05/29/16 1300  TempSrc: Axillary  PainSc:          Complications: No apparent anesthesia complications

## 2016-05-29 NOTE — Discharge Instructions (Signed)
    1. Change dressings as needed 2. May shower but keep incisions covered and dry 3. Take eliquis to prevent blood clots 4. Take stool softeners as needed 5. Take pain meds as needed   

## 2016-05-29 NOTE — Anesthesia Procedure Notes (Signed)
Procedure Name: Intubation Date/Time: 05/29/2016 5:21 PM Performed by: Rosiland Oz Pre-anesthesia Checklist: Emergency Drugs available, Patient identified, Suction available, Patient being monitored and Timeout performed Patient Re-evaluated:Patient Re-evaluated prior to inductionOxygen Delivery Method: Circle system utilized Preoxygenation: Pre-oxygenation with 100% oxygen Intubation Type: IV induction Ventilation: Mask ventilation without difficulty Laryngoscope Size: Miller and 3 Grade View: Grade I Tube type: Oral Tube size: 7.0 mm Number of attempts: 1 Airway Equipment and Method: Stylet Placement Confirmation: ETT inserted through vocal cords under direct vision,  positive ETCO2 and breath sounds checked- equal and bilateral Secured at: 21 cm Tube secured with: Tape Dental Injury: Teeth and Oropharynx as per pre-operative assessment

## 2016-05-29 NOTE — Progress Notes (Addendum)
Patient ID: Tammy Branch, female   DOB: 17-Oct-1921, 83 y.o.   MRN: [id number]    PROGRESS NOTE    Tammy Branch  EFE:[id number] DOB: January 08, 1922 DOA: 05/27/2016  PCP: [name]., DO   Brief Narrative:  83 y.o. female with known HTN, a-fib on eliquis, hypothyroidism, parkinson's, chronic diastolic CHF,  presented to [facility] ED after an episode of fall at home, tripping on the carpet and falling the right side. Now with constant right hip pain, sharp and constant, 7/10 in severity, non radiating, no specific alleviating factors, worse with movement. No fevers, chills, no chest pain, no dyspnea, no specific abd or urinary concerns. No symptoms prior to falls, no seizure like activity, no similar events in the past. Please note most information was provided from family at bedside due to patient's dementia.   ED Course: Pt is hemodynamically stable, VS notable for HR 56, BP 167/54, blood work not completed, CBC only with Plt slightly low 141 K. Imaging studies notable for right hip fracture. Ortho consulted, TRH to admit for further evaluation.   During this hospital course, eliquis was placed on hold and patient has since undergone surgery on 3/20.   Assessment & Plan:   Assessment/Plan Active Problems: Right femoral neck fracture - eliquis remains on hold - Orthopedic surgery following. Plans for surgery today - Stable at present  Transaminitis - uncertain etiology - Denies abd pain - RUQ [place] reviewed. Gallstones noted without evidence of cholecystitis. No GB thickening on [place]  HTN, essential  - resumed home medical regimen  - BP stable, albeit suboptimally controlled - Continue analgasia as tolerated  Thrombocytopenia - mild, possibly reactive - Will repeat CBC in AM  Acute on chronic diastolic CHF - on minimal O2 support - continue home lasix as tolerated - daily weights, strict I/O  Hypokalemia - Improved, but still low - Will correct - Will repeat bmet in  AM  Hypothyroidism - continue synthroid as tolerated - Stable  A-fib - presently rate controlled - holding Eliquis per above - would resume eliquis when OK with surgery  Dementia - reportedly at baseline    DVT prophylaxis: SCD's Code Status: Full  Family Communication: Patient and son at bedside  Disposition Plan: likely SNF post op  Consultants:   Ortho  Procedures:   None   Antimicrobials:   None  Subjective: No complaints at present  Objective: Vitals:   05/29/16 0556 05/29/16 0746 05/29/16 1000 05/29/16 1300  BP: (!) 155/58  (!) 149/36 (!) 159/39  Pulse: 64   (!) 50  Resp: 16     Temp: 98.1 F (36.7 C)   98 F (36.7 C)  TempSrc:    Axillary  SpO2: 92%   96%  Weight:  57.1 kg (125 lb 14.4 oz)    Height:        Intake/Output Summary (Last 24 hours) at 05/29/16 1545 Last data filed at 05/29/16 1200  Gross per 24 hour  Intake           246.25 ml  Output              700 ml  Net          -453.75 ml   Filed Weights   05/28/16 0500 05/29/16 0746  Weight: 55.5 kg (122 lb 6.4 oz) 57.1 kg (125 lb 14.4 oz)    Examination:  General exam: laying in bed, awake, in nad Respiratory system: normal chest rise, no audible wheezing Cardiovascular system:  regular rate, s1-2 Gastrointestinal system: soft, nondistended, pos BS Extremities: perfused, no clubbing Skin normal skin turgor, no notable skin lesions seen Neuro: no seizures, no tremors Psych: mood appears normal// no visual hallucinaions  Data Reviewed: I have personally reviewed following labs and imaging studies  CBC:  Recent Labs Lab 05/27/16 1628 05/28/16 0518 05/29/16 0524  WBC 8.9 5.9 5.2  NEUTROABS 7.6  --   --   HGB 13.3 12.8 12.0  HCT 40.6 39.1 37.0  MCV 96.2 94.9 94.4  PLT 141* 130* 123*   Basic Metabolic Panel:  Recent Labs Lab 05/27/16 1628 05/28/16 0518 05/29/16 0524  NA 132* 139 136  K 5.6* 3.1* 3.3*  CL 100* 101 99*  CO2 27 30 29   GLUCOSE 135* 100* 83  BUN  17 14 17   CREATININE 0.92 0.88 1.05*  CALCIUM 8.5* 8.4* 8.1*   Liver Function Tests:  Recent Labs Lab 05/27/16 1628 05/29/16 0524  AST 108* 40  ALT 60* 33  ALKPHOS 162* 94  BILITOT 1.5* 1.6*  PROT 5.5* 4.7*  ALBUMIN 3.3* 2.5*   Coagulation Profile:  Recent Labs Lab 05/27/16 1715  INR 1.38   Urine analysis:    Component Value Date/Time   COLORURINE STRAW (A) 05/27/2016 2347   APPEARANCEUR CLEAR 05/27/2016 2347   LABSPEC 1.005 05/27/2016 2347   PHURINE 8.0 05/27/2016 2347   GLUCOSEU NEGATIVE 05/27/2016 2347   HGBUR SMALL (A) 05/27/2016 2347   BILIRUBINUR NEGATIVE 05/27/2016 2347   BILIRUBINUR negative 12/06/2014 1507   KETONESUR NEGATIVE 05/27/2016 2347   PROTEINUR 30 (A) 05/27/2016 2347   UROBILINOGEN 2.0 (H) 12/23/2014 1942   NITRITE NEGATIVE 05/27/2016 2347   LEUKOCYTESUR NEGATIVE 05/27/2016 2347   Recent Results (from the past 240 hour(s))  Urine culture     Status: None   Collection Time: 05/27/16 11:47 PM  Result Value Ref Range Status   Specimen Description URINE, RANDOM  Final   Special Requests NONE  Final   Culture NO GROWTH  Final   Report Status 05/29/2016 FINAL  Final  MRSA PCR Screening     Status: None   Collection Time: 05/27/16 11:59 PM  Result Value Ref Range Status   MRSA by PCR NEGATIVE NEGATIVE Final    Comment:        The GeneXpert MRSA Assay (FDA approved for NASAL specimens only), is one component of a comprehensive MRSA colonization surveillance program. It is not intended to diagnose MRSA infection nor to guide or monitor treatment for MRSA infections.       Radiology Studies: Dg Chest 1 View  Result Date: 05/27/2016 CLINICAL DATA:  Right hip fracture following a fall. EXAM: CHEST 1 VIEW COMPARISON:  03/09/2016. FINDINGS: Stable enlarged cardiac silhouette. Prominent pulmonary vasculature and interstitial markings with Kerley lines. Small bilateral pleural effusions. Atheromatous aortic calcifications. Thoracic spine  degenerative changes. IMPRESSION: 1. Cardiomegaly and changes of congestive heart failure. 2. Aortic atherosclerosis. Electronically Signed   By: Beckie Salts M.D.   On: 05/27/2016 16:16   Dg Lumbar Spine 2-3 Views  Result Date: 05/27/2016 CLINICAL DATA:  Status post fall with hip fracture. EXAM: LUMBAR SPINE - 2-3 VIEW COMPARISON:  MRI of the spine 12/23/2014 FINDINGS: There is no evidence of acute lumbar spine fracture. There is compression fracture with severe anterior compression deformity of L1 vertebral body, which appears stable from MRI dated 12/23/2014, accounting for cross modality comparison. There is a stable retropulsion into the spinal canal of the L1 vertebral body. There is an exaggerated  kyphosis centered at this level. Mild multilevel osteoarthritic changes is seen. There is generalized osteopenia. Calcific atherosclerotic disease of the aorta noted. IMPRESSION: No evidence of new lumbosacral spine fractures. Stable compression fracture with severe anterior wedge deformity and retropulsion of L1 vertebral body. Generalized osteopenia. Electronically Signed   By: Ted Mcalpine M.D.   On: 05/27/2016 18:20   Dg Hip Unilat  With Pelvis 2-3 Views Right  Result Date: 05/27/2016 CLINICAL DATA:  Right hip pain following a fall on carpet at home. EXAM: DG HIP (WITH OR WITHOUT PELVIS) 2-3V RIGHT COMPARISON:  10/15/2014. FINDINGS: Right femoral neck fracture with varus angulation and mild proximal displacement of the distal fragment. Diffuse osteopenia. Mild lower lumbar spine levoconvex scoliosis and degenerative changes. IMPRESSION: Right femoral neck fracture, as described above. Electronically Signed   By: Beckie Salts M.D.   On: 05/27/2016 16:15   Scheduled Meds: . amLODipine  2.5 mg Oral Daily  . atorvastatin  40 mg Oral q1800  . carvedilol  6.25 mg Oral BID WC  .  ceFAZolin (ANCEF) IV  2 g Intravenous To SS-Surg  . donepezil  5 mg Oral QHS  . furosemide  20 mg Oral Daily  .  levothyroxine  50 mcg Oral QAC breakfast  . lisinopril  40 mg Oral Daily  . potassium chloride  40 mEq Oral BID  . povidone-iodine  2 application Topical Once   Continuous Infusions: . dextrose 5 % and 0.9% NaCl 75 mL/hr at 05/29/16 1007     LOS: 2 days   Time spent: 20 minutes   Patra Gherardi, Scheryl Marten, MD [facility] Pager [phone]  If 7PM-7AM, please contact night-coverage [email or web address] Password [facility] 05/29/2016, 3:45 PM

## 2016-05-29 NOTE — Anesthesia Postprocedure Evaluation (Signed)
Anesthesia Post Note  Patient: Tammy Branch  Procedure(s) Performed: Procedure(s) (LRB): ANTERIOR APPROACH RIGHT HEMI HIP ARTHROPLASTY (Right)  Patient location during evaluation: PACU Anesthesia Type: General Level of consciousness: awake and alert Pain management: pain level controlled Vital Signs Assessment: post-procedure vital signs reviewed and stable Respiratory status: spontaneous breathing, nonlabored ventilation, respiratory function stable and patient connected to nasal cannula oxygen Cardiovascular status: blood pressure returned to baseline and stable Postop Assessment: no signs of nausea or vomiting Anesthetic complications: no       Last Vitals:  Vitals:   05/29/16 2010 05/29/16 2100  BP: (!) 168/43 (!) 120/42  Pulse: (!) 48 70  Resp: 11 16  Temp: 36.2 C 36.5 C    Last Pain:  Vitals:   05/29/16 2100  TempSrc: Oral  PainSc:                  [name]

## 2016-05-29 NOTE — Op Note (Addendum)
ANTERIOR APPROACH RIGHT HEMI HIP ARTHROPLASTY  Procedure Note Tammy Branch   [id number]  Pre-op Diagnosis: right femoral neck fracture     Post-op Diagnosis: same   Operative Procedures  1. Prosthetic replacement for femoral neck fracture. CPT [phone]  Personnel  Surgeon(s): Tarry Kos, MD   Anesthesia: general  Prosthesis: depuy Femur: corail KLA 12 Head: 46 size: +1.5 Bearing Type: bipolar  Date of Service: 05/29/2016  Hip Hemiarthroplasty (Anterior Approach) Op Note:  After informed consent was obtained and the operative extremity marked in the holding area, the patient was brought back to the operating room and placed supine on the HANA table. Next, the operative extremity was prepped and draped in normal sterile fashion. Surgical timeout occurred verifying patient identification, surgical site, surgical procedure and administration of antibiotics.  A modified anterior Smith-Peterson approach to the hip was performed, using the interval between tensor fascia lata and sartorius.  Dissection was carried bluntly down onto the anterior hip capsule. The lateral femoral circumflex vessels were identified and coagulated. A capsulotomy was performed and the capsular flaps tagged for later repair.  Fluoroscopy was utilized to prepare for the femoral neck cut. The neck osteotomy was performed. The femoral head was removed and found a 46 mm head was the appropriate fit.    We then turned our attention to the femur.  After placing the femoral hook, the leg was taken to externally rotated, extended and adducted position taking care to perform soft tissue releases to allow for adequate mobilization of the femur. Soft tissue was cleared from the shoulder of the greater trochanter and the hook elevator used to improve exposure of the proximal femur. Sequential broaching performed up to a size 12. Trial neck and head were placed. The leg was brought back up to neutral and the construct reduced.  The position and sizing of components, offset and leg lengths were checked using fluoroscopy. Stability of the construct was checked in extension and external rotation without any subluxation or impingement of prosthesis. We dislocated the prosthesis, dropped the leg back into position, removed trial components, and irrigated copiously. The final stem and head was then placed, the leg brought back up, the system reduced and fluoroscopy used to verify positioning.  We irrigated, obtained hemostasis and closed the capsule using #2 ethibond suture.  The fascia was closed with #1 vicryl plus, the deep fat layer was closed with 0 vicryl, the subcutaneous layers closed with 2.0 Vicryl Plus and the skin closed with staples. A sterile dressing was applied. The patient was awakened in the operating room and taken to recovery in stable condition. All sponge, needle, and instrument counts were correct at the end of the case.   Position: supine  Complications: none.  Time Out: performed   Drains/Packing: none  Estimated blood loss: 200 cc  Returned to Recovery Room: in good condition.   Antibiotics: yes   Mechanical VTE (DVT) Prophylaxis: sequential compression devices, TED thigh-high  Chemical VTE (DVT) Prophylaxis: resume eliquis on POD 1  Fluid Replacement: Crystalloid: see anesthesia record  Specimens Removed: 1 to pathology   Sponge and Instrument Count Correct? yes   PACU: portable radiograph - low AP   Admission: inpatient status, start PT & OT POD#1  Plan/RTC: Return in 2 weeks for staple removal. Return in 6 weeks to see MD.  Weight Bearing/Load Lower Extremity: full  Hip precautions: none Suture Removal: 10-14 days  Betadine to incision twice daily once dressing is removed on POD#7  Mayra Reel, MD [facility] [phone] 6:55 PM     Implant Name Type Inv. Item Serial No. Manufacturer Lot No. LRB No. Used  STEM Rodena Piety - [id number] Stem STEM CORAIL [id number]   DEPUY SYNTHES [id number] Right 1  BIPOLAR PROS AML - [id number] Hips BIPOLAR PROS AML  DEPUY SYNTHES [id number] Right 1  HIP BALL ARTICU DEPUY - [id number] Hips HIP BALL ARTICU DEPUY   DEPUY SYNTHES [id number] Right 1

## 2016-05-30 ENCOUNTER — Encounter (HOSPITAL_COMMUNITY): Payer: Self-pay | Admitting: Orthopaedic Surgery

## 2016-05-30 ENCOUNTER — Other Ambulatory Visit: Payer: Self-pay | Admitting: Family Medicine

## 2016-05-30 DIAGNOSIS — S72001A Fracture of unspecified part of neck of right femur, initial encounter for closed fracture: Principal | ICD-10-CM

## 2016-05-30 DIAGNOSIS — R740 Nonspecific elevation of levels of transaminase and lactic acid dehydrogenase [LDH]: Secondary | ICD-10-CM

## 2016-05-30 DIAGNOSIS — I480 Paroxysmal atrial fibrillation: Secondary | ICD-10-CM

## 2016-05-30 DIAGNOSIS — D696 Thrombocytopenia, unspecified: Secondary | ICD-10-CM

## 2016-05-30 DIAGNOSIS — R7401 Elevation of levels of liver transaminase levels: Secondary | ICD-10-CM

## 2016-05-30 DIAGNOSIS — I10 Essential (primary) hypertension: Secondary | ICD-10-CM

## 2016-05-30 LAB — BASIC METABOLIC PANEL
ANION GAP: 6 (ref 5–15)
BUN: 20 mg/dL (ref 6–20)
CHLORIDE: 104 mmol/L (ref 101–111)
CO2: 27 mmol/L (ref 22–32)
Calcium: 7.7 mg/dL — ABNORMAL LOW (ref 8.9–10.3)
Creatinine, Ser: 1.14 mg/dL — ABNORMAL HIGH (ref 0.44–1.00)
GFR calc Af Amer: 47 mL/min — ABNORMAL LOW (ref >60)
GFR calc non Af Amer: 40 mL/min — ABNORMAL LOW (ref >60)
GLUCOSE: 123 mg/dL — AB (ref 65–99)
POTASSIUM: 5.3 mmol/L — AB (ref 3.5–5.1)
Sodium: 137 mmol/L (ref 135–145)

## 2016-05-30 LAB — CBC
HCT: 27 % — ABNORMAL LOW (ref 36.0–46.0)
HEMOGLOBIN: 8.8 g/dL — AB (ref 12.0–15.0)
MCH: 31 pg (ref 26.0–34.0)
MCHC: 32.6 g/dL (ref 30.0–36.0)
MCV: 95.1 fL (ref 78.0–100.0)
Platelets: 135 10*3/uL — ABNORMAL LOW (ref 150–400)
RBC: 2.84 MIL/uL — ABNORMAL LOW (ref 3.87–5.11)
RDW: 14 % (ref 11.5–15.5)
WBC: 7 10*3/uL (ref 4.0–10.5)

## 2016-05-30 NOTE — Clinical Social Work Placement (Signed)
   CLINICAL SOCIAL WORK PLACEMENT  NOTE  Date:  05/30/2016  Patient Details  Name: Tammy Branch MRN: [id number] Date of Birth: January 29, 1922  Clinical Social Work is seeking post-discharge placement for this patient at the Skilled  Nursing Facility level of care (*CSW will initial, date and re-position this form in  chart as items are completed):      Patient/family provided with [facility] Clinical Social Work Department's list of facilities offering this level of care within the geographic area requested by the patient (or if unable, by the patient's family).      Patient/family informed of their freedom to choose among providers that offer the needed level of care, that participate in Medicare, Medicaid or managed care program needed by the patient, have an available bed and are willing to accept the patient.      Patient/family informed of [facility]'s ownership interest in [facility] and [facility], as well as of the fact that they are under no obligation to receive care at these facilities.  PASRR submitted to EDS on       PASRR number received on 05/30/16     Existing PASRR number confirmed on       FL2 transmitted to all facilities in geographic area requested by pt/family on 05/30/16     FL2 transmitted to all facilities within larger geographic area on       Patient informed that his/her managed care company has contracts with or will negotiate with certain facilities, including the following:        Yes   Patient/family informed of bed offers received.  Patient chooses bed at       Physician recommends and patient chooses bed at      Patient to be transferred to   on  .  Patient to be transferred to facility by       Patient family notified on   of transfer.  Name of family member notified:        PHYSICIAN Please prepare priority discharge summary, including medications, Please prepare prescriptions, Please sign FL2     Additional Comment:     _______________________________________________ Maree Krabbe, LCSW 05/30/2016, 3:53 PM

## 2016-05-30 NOTE — Progress Notes (Signed)
   Subjective:  Patient reports pain as mild.  No events.  Objective:   VITALS:   Vitals:   05/29/16 1940 05/29/16 2010 05/29/16 2100 05/30/16 0553  BP: (!) 178/45 (!) 168/43 (!) 120/42 (!) 164/57  Pulse: (!) 50 (!) 48 70   Resp: (!) 9 11 16 16   Temp:  97.2 F (36.2 C) 97.7 F (36.5 C) 98.7 F (37.1 C)  TempSrc:   Oral Oral  SpO2: 95% 99% 98% 99%  Weight:      Height:        Neurologically intact Neurovascular intact Sensation intact distally Intact pulses distally Dorsiflexion/Plantar flexion intact Incision: dressing C/D/I and no drainage No cellulitis present Compartment soft   Lab Results  Component Value Date   WBC 7.0 05/30/2016   HGB 8.8 (L) 05/30/2016   HCT 27.0 (L) 05/30/2016   MCV 95.1 05/30/2016   PLT 135 (L) 05/30/2016     Assessment/Plan:  1 Day Post-Op   - Expected postop acute blood loss anemia - will monitor for symptoms - Up with PT/OT - DVT ppx - SCDs, ambulation, eliquis - WBAT operative extremity, no hip precautions - Pain control - Discharge planning - needs SNF  Glee Arvin 05/30/2016, 8:29 AM [phone]

## 2016-05-30 NOTE — Clinical Social Work Note (Signed)
Clinical Social Work Assessment  Patient Details  Name: Tammy Branch MRN: [id number] Date of Birth: [birth year]  Date of referral:  05/30/16               Reason for consult:  Facility Placement                Permission sought to share information with:    Permission granted to share information::     Name::     Roe Coombs  Agency::     Relationship::  Son, POA  Contact Information:     Housing/Transportation Living arrangements for the past 2 months:  Single Family Home Source of Information:  Adult Children Patient Interpreter Needed:  None Criminal Activity/Legal Involvement Pertinent to Current Situation/Hospitalization:  No - Comment as needed Significant Relationships:  Adult Children Lives with:  Adult Children Do you feel safe going back to the place where you live?    Need for family participation in patient care:     Care giving concerns:  Pt is only alert to self. CSW spoke with pt's son, POA.   Social Worker assessment / plan:  Pt is only alert to self. Pt's son/POA was present at bedside. Pt's son states pt was living at home prior to admission with his son. However, the pt's son believes pt may be getting to be too difficult for her grandson to care for. Pt's son is agreeable and knows pt will need SNF at d/c. Pt's son also is exploring the idea of long term if pt does not become independent enough for grandson to care for pt at home. Pt's son agreeable to CSW sending referral to [place] facilities.   Employment status:  Retired Health and safety inspector:  Medicare PT Recommendations:  Skilled Nursing Facility Information / Referral to community resources:  Skilled Nursing Facility  Patient/Family's Response to care:  Pt's son verbalized understanding of CSW role and expressed appreciation for support. Pt's son denies any concern regarding pt care at this time.   Patient/Family's Understanding of and Emotional Response to Diagnosis, Current Treatment, and Prognosis:   Pt's son understanding and realistic regarding physical limitations. Pt's son understands the need for SNF placement at d/c. Pt's son agreeable to SNF placement at d/c, at this time.  Pt's son denies any concern regarding treatment plan at this time. CSW will continue to provide support and facilitate d/c needs.   Emotional Assessment Appearance:  Appears stated age Attitude/Demeanor/Rapport:  Unable to Assess Affect (typically observed):  Unable to Assess Orientation:  Oriented to Self Alcohol / Substance use:  Not Applicable Psych involvement (Current and /or in the community):  No (Comment)  Discharge Needs  Concerns to be addressed:  No discharge needs identified Readmission within the last 30 days:  No Current discharge risk:  Dependent with Mobility Barriers to Discharge:  Continued Medical Work up   [employer], LCSW 05/30/2016, 3:49 PM

## 2016-05-30 NOTE — [place] FL2 (Addendum)
[place] MEDICAID FL2 LEVEL OF CARE SCREENING TOOL     IDENTIFICATION  Patient Name: Tammy Branch Birthdate: 1922/02/17 Sex: female Admission Date (Current Location): 05/27/2016  [facility] and [place] Number:  Producer, television/film/video and Address:  [facility] [place], [address], [place], [place] [address]      Provider Number: [id number]  Attending Physician Name and Address:  Narda Bonds, MD  Relative Name and Phone Number:       Current Level of Care: Hospital Recommended Level of Care: Skilled Nursing Facility Prior Approval Number:    Date Approved/Denied:   PASRR Number: [id number]    Discharge Plan: SNF    Current Diagnoses: Patient Active Problem List   Diagnosis Date Noted  . Closed right hip fracture, initial encounter (HCC) 05/27/2016  . Paroxysmal atrial fibrillation (HCC) 04/26/2016  . NSTEMI (non-ST elevated myocardial infarction) (HCC) 03/09/2016  . Accelerated hypertension 03/09/2016  . Hyperlipidemia 03/09/2016  . Hypertension   . Undiagnosed cardiac murmurs 08/19/2014  . Dementia 05/27/2014  . PARKINSON'S DISEASE 06/03/2009  . Hypothyroidism 12/12/2005  . Essential hypertension 12/12/2005    Orientation RESPIRATION BLADDER Height & Weight     Self  Normal Incontinent Weight: 132 lb 3.2 oz (60 kg) Height:  5\' 6"  (167.6 cm) (Per chart)  BEHAVIORAL SYMPTOMS/MOOD NEUROLOGICAL BOWEL NUTRITION STATUS      Continent  (Please see d/c summary)  AMBULATORY STATUS COMMUNICATION OF NEEDS Skin   Limited Assist Verbally Surgical wounds (Closed incision right hip, adhesive bandage)                       Personal Care Assistance Level of Assistance  Bathing, Feeding, Dressing Bathing Assistance: Limited assistance Feeding assistance: Independent Dressing Assistance: Limited assistance     Functional Limitations Info  Sight, Hearing, Speech Sight Info: Adequate Hearing Info: Adequate Speech Info: Adequate    SPECIAL  CARE FACTORS FREQUENCY  PT (By licensed PT), OT (By licensed OT)     PT Frequency: 5x OT Frequency: 5x            Contractures Contractures Info: Not present    Additional Factors Info  Code Status, Allergies Code Status Info: DNR Allergies Info: Citalopram Hydrobromide           Current Medications (05/30/2016):  This is the current hospital active medication list Current Facility-Administered Medications  Medication Dose Route Frequency Provider Last Rate Last Dose  . acetaminophen (TYLENOL) tablet 650 mg  650 mg Oral Q6H PRN Naiping Donnelly Stager, MD       Or  . acetaminophen (TYLENOL) suppository 650 mg  650 mg Rectal Q6H PRN Naiping Donnelly Stager, MD      . alum & mag hydroxide-simeth (MAALOX/MYLANTA) 200-200-20 MG/5ML suspension 30 mL  30 mL Oral Q4H PRN Tarry Kos, MD      . amLODipine (NORVASC) tablet 2.5 mg  2.5 mg Oral Daily Dorothea Ogle, MD   2.5 mg at 05/30/16 1046  . apixaban (ELIQUIS) tablet 2.5 mg  2.5 mg Oral BID Tarry Kos, MD      . atorvastatin (LIPITOR) tablet 40 mg  40 mg Oral q1800 Dorothea Ogle, MD   40 mg at 05/28/16 1957  . carvedilol (COREG) tablet 6.25 mg  6.25 mg Oral BID WC Jerald Kief, MD   6.25 mg at 05/28/16 1957  . dextrose 5 %-0.9 % sodium chloride infusion   Intravenous Continuous Jerald Kief,  MD 75 mL/hr at 05/29/16 1007    . donepezil (ARICEPT) tablet 5 mg  5 mg Oral QHS Dorothea Ogle, MD   5 mg at 05/29/16 2137  . fentaNYL (SUBLIMAZE) injection 50 mcg  50 mcg Intravenous Q2H PRN Blane Ohara, MD      . furosemide (LASIX) tablet 20 mg  20 mg Oral Daily Dorothea Ogle, MD   20 mg at 05/30/16 1046  . HYDROcodone-acetaminophen (NORCO/VICODIN) 5-325 MG per tablet 1-2 tablet  1-2 tablet Oral Q6H PRN Tarry Kos, MD   1 tablet at 05/30/16 1242  . lactated ringers infusion   Intravenous Continuous Val Eagle, MD      . lactated ringers infusion   Intravenous Continuous Achille Rich, MD 50 mL/hr at 05/29/16 1648    . levothyroxine (SYNTHROID,  LEVOTHROID) tablet 50 mcg  50 mcg Oral QAC breakfast Dorothea Ogle, MD   50 mcg at 05/30/16 0640  . lisinopril (PRINIVIL,ZESTRIL) tablet 40 mg  40 mg Oral Daily Dorothea Ogle, MD   40 mg at 05/30/16 1046  . menthol-cetylpyridinium (CEPACOL) lozenge 3 mg  1 lozenge Oral PRN Naiping Donnelly Stager, MD       Or  . phenol (CHLORASEPTIC) mouth spray 1 spray  1 spray Mouth/Throat PRN Naiping Donnelly Stager, MD      . methocarbamol (ROBAXIN) tablet 500 mg  500 mg Oral Q6H PRN Naiping Donnelly Stager, MD       Or  . methocarbamol (ROBAXIN) 500 mg in dextrose 5 % 50 mL IVPB  500 mg Intravenous Q6H PRN Naiping Donnelly Stager, MD      . metoCLOPramide (REGLAN) tablet 5-10 mg  5-10 mg Oral Q8H PRN Naiping Donnelly Stager, MD       Or  . metoCLOPramide (REGLAN) injection 5-10 mg  5-10 mg Intravenous Q8H PRN Naiping Donnelly Stager, MD      . morphine 2 MG/ML injection 0.5 mg  0.5 mg Intravenous Q2H PRN Dorothea Ogle, MD      . morphine 2 MG/ML injection 0.5 mg  0.5 mg Intravenous Q2H PRN Naiping Donnelly Stager, MD      . ondansetron (ZOFRAN) tablet 4 mg  4 mg Oral Q6H PRN Naiping Donnelly Stager, MD       Or  . ondansetron (ZOFRAN) injection 4 mg  4 mg Intravenous Q6H PRN Naiping Donnelly Stager, MD      . oxyCODONE (Oxy IR/ROXICODONE) immediate release tablet 5-10 mg  5-10 mg Oral Q4H PRN Naiping Donnelly Stager, MD      . tranexamic acid (CYKLOKAPRON) 2,000 mg in sodium chloride 0.9 % 50 mL Topical Application  2,000 mg Topical To OR Naiping Donnelly Stager, MD         Discharge Medications: Please see discharge summary for a list of discharge medications.  Relevant Imaging Results:  Relevant Lab Results:   Additional Information SSN: [id number]  Maree Krabbe, LCSW

## 2016-05-30 NOTE — Evaluation (Addendum)
Physical Therapy Evaluation Patient Details Name: Tammy Branch MRN: [id number] DOB: 07/27/1921 Today's Date: 05/30/2016   History of Present Illness  Pt is a 82 y/o female s/p R hemi hip arthroplasty secondary to R femoral neck fracture from a fall. PMH includes dementia, HTN, and parkinsons.   Clinical Impression  Pt s/p procedure above with deficits below. PTA, pt's grandson reports pt required assist with ADLs, however, did not require assist with mobility. Pt currently presenting with post op pain and weakness, along with confusion secondary to dementia, which limited independence with functional mobility. Pt demonstrating some agitation with OT upon questioning and cursed at OT, however, did not become physically aggressive. Pt requiring max A +2 for basic mobility and transfers. Pt's grandson reports the plan is to go to SNF at d/c. Recommendations listed below. Will continue to follow to maximize functional mobility independence.     Follow Up Recommendations SNF;Supervision/Assistance - 24 hour    Equipment Recommendations  Wheelchair (measurements PT);Wheelchair cushion (measurements PT);Hospital bed    Recommendations for Other Services       Precautions / Restrictions Precautions Precautions: Fall Restrictions Weight Bearing Restrictions: Yes RLE Weight Bearing: Weight bearing as tolerated      Mobility  Bed Mobility Overal bed mobility: Needs Assistance Bed Mobility: Supine to Sit;Sit to Supine     Supine to sit: Max assist;+2 for physical assistance Sit to supine: Max assist;+2 for physical assistance   General bed mobility comments: Max A for trunk elevation and LE management secondary to pain in R hip. Verbal cues for sequencing. Able to assist using RLE to scoot hips.   Transfers Overall transfer level: Needs assistance Equipment used: 2 person hand held assist Transfers: Sit to/from Stand Sit to Stand: Max assist;+2 physical assistance          General transfer comment: Max A for lifting. Pt very resistant to movement. Asks if we are trying to finish her and kill her off multiple times throughout mobility. Unable to attempt stand pivot transfer secondary to resistance.   Ambulation/Gait                Stairs            Wheelchair Mobility    Modified Rankin (Stroke Patients Only)       Balance Overall balance assessment: Needs assistance Sitting-balance support: Bilateral upper extremity supported;Feet supported Sitting balance-Leahy Scale: Poor Sitting balance - Comments: mod-supervision assist required for sitting balance    Standing balance support: Bilateral upper extremity supported Standing balance-Leahy Scale: Poor Standing balance comment: max A +2 to maintain standing.                              Pertinent Vitals/Pain Pain Assessment: Faces Faces Pain Scale: Hurts whole lot Pain Location: R hip  Pain Descriptors / Indicators: Aching;Operative site guarding;Moaning;Guarding;Grimacing;Sore Pain Intervention(s): Limited activity within patient's tolerance    Home Living Family/patient expects to be discharged to:: Skilled nursing facility Living Arrangements: Other (Comment) (grandchildren)               Additional Comments: Walker, shower chair at home. Lives with grandson. Grandson reports the plan is to go to SNF.     Prior Function Level of Independence: Needs assistance      ADL's / Homemaking Assistance Needed: Lucila Maine helps with ADL tasks like bathing.   Comments: Pt's grandson reports she did not use an AD at home. Had  previous fall at home several years ago.      Hand Dominance   Dominant Hand: Right    Extremity/Trunk Assessment   Upper Extremity Assessment Upper Extremity Assessment: Generalized weakness    Lower Extremity Assessment Lower Extremity Assessment: Defer to PT evaluation RLE Deficits / Details: Deficits consistent with post op pain and  weakness. DF/PF at least 2/5; required assist into DF. Unable to test other muscle groups secondary to pain.  LLE Deficits / Details: Grossly 3/5 throughout per pt's ability to perform heel slide and PF/DF    Cervical / Trunk Assessment Cervical / Trunk Assessment: Kyphotic  Communication   Communication: Other (comment) (Dementia causing disorientation)  Cognition Arousal/Alertness: Awake/alert Behavior During Therapy: Agitated Overall Cognitive Status: History of cognitive impairments - at baseline                 General Comments: PMH includes dementia. Pt agitated at points throughout the session and cursing at therapist, however, did not become physically agressive. Easily redirectable.     General Comments General comments (skin integrity, edema, etc.): grandson present.  Pt resistive to therapies today - question if some of this may be due to pain and confusion     Exercises     Assessment/Plan    PT Assessment Patient needs continued PT services  PT Problem List Decreased strength;Decreased range of motion;Decreased activity tolerance;Decreased balance;Decreased mobility;Decreased coordination;Decreased cognition;Decreased knowledge of use of DME;Decreased safety awareness;Decreased knowledge of precautions;Pain       PT Treatment Interventions DME instruction;Gait training;Functional mobility training;Therapeutic activities;Therapeutic exercise;Balance training;Neuromuscular re-education;Patient/family education    PT Goals (Current goals can be found in the Care Plan section)  Acute Rehab PT Goals Patient Stated Goal: unable to state PT Goal Formulation: With patient Time For Goal Achievement: 06/06/16 Potential to Achieve Goals: Fair    Frequency Min 3X/week   Barriers to discharge Other (comment) Grandson presenting with L sided weakness. Unsure of ability to assist pt at home.     Co-evaluation PT/OT/SLP Co-Evaluation/Treatment: Yes Reason for  Co-Treatment: Complexity of the patient's impairments (multi-system involvement) PT goals addressed during session: Mobility/safety with mobility OT goals addressed during session: ADL's and self-care       End of Session Equipment Utilized During Treatment: Gait belt Activity Tolerance: Patient limited by pain Patient left: in bed;with call bell/phone within reach;with family/visitor present;with bed alarm set Nurse Communication: Mobility status PT Visit Diagnosis: Other abnormalities of gait and mobility (R26.89);Pain Pain - Right/Left: Right Pain - part of body: Hip         Time: 1351-1415 PT Time Calculation (min) (ACUTE ONLY): 24 min   Charges:   PT Evaluation $PT Eval Moderate Complexity: 1 Procedure PT Treatments $Therapeutic Activity: 8-22 mins   PT G Codes:         Melvyn Novas 05/30/2016, 4:04 PM  Margot Chimes, PT, DPT  Acute Rehabilitation Services  Pager: [phone]

## 2016-05-30 NOTE — Progress Notes (Signed)
PROGRESS NOTE    Tammy Branch  ZOX:[id number] DOB: April 20, 1921 DOA: 05/27/2016 PCP: [name]., DO   Brief Narrative: Tammy Branch is a 82 y.o. femalewith known HTN, a-fib on eliquis, hypothyroidism, parkinson's, chronic diastolic CHF, presented to [facility] ED after an episode of fall at home, tripping on the carpet and falling the right side. She was found to have a right hip fracture and underwent right hemi hip arthroplasty on 05/29/2016   Assessment & Plan:   Principal Problem:   Closed right hip fracture, initial encounter [facility]) Active Problems:   Essential hypertension   Paroxysmal atrial fibrillation (HCC)   Elevated transaminase level   Thrombocytopenia (HCC)   Right femoral neck fracture s/p right hemi-hip arthroplasty -resume Eliquis -PT/OT recommendations: SNF  Elevated transaminases History of gallstones without cholecystitis. Asymptomatic. Resolved.  Essential hypertension -continue antihypertensives  Thrombocytopenia Reactive. Improved.  Acute on chronic diastolic heart failure -continue Lasix  Hypokalemia -replete  Paroxysmal atrial fibrillation Rate controlled. On Eliquis as an outpatient -Eliquis -Carvedilol  Dementia Baseline.    DVT prophylaxis: Eliquis Code Status: DNR/DNI Family Communication: None at bedside Disposition Plan: Discharge to SNF   Consultants:   Orthopedic surgery  Procedures:   Right hemi hip arthroplasty  Antimicrobials:   Cefazolin (pre-procedure)    Subjective: Pain with minimal pain. No chest pain or dyspnea.  Objective: Vitals:   05/30/16 0800 05/30/16 1045 05/30/16 1457 05/30/16 1600  BP: (!) 118/30 (!) 142/36 (!) 132/33 (!) 142/41  Pulse: (!) 57 (!) 57 60 60  Resp:      Temp:   97.6 F (36.4 C)   TempSrc:   Axillary   SpO2: 98% 97% 98%   Weight:      Height:        Intake/Output Summary (Last 24 hours) at 05/30/16 1634 Last data filed at 05/30/16 1600  Gross per 24 hour    Intake             1705 ml  Output              575 ml  Net             1130 ml   Filed Weights   05/28/16 0500 05/29/16 0746 05/30/16 0700  Weight: 55.5 kg (122 lb 6.4 oz) 57.1 kg (125 lb 14.4 oz) 60 kg (132 lb 3.2 oz)    Examination:  General exam: Appears calm and comfortable Respiratory system: Clear to auscultation. Respiratory effort normal. Cardiovascular system: S1 & S2 heard, RRR. 2/6 systolic murmur Gastrointestinal system: Abdomen is nondistended, soft and nontender. Normal bowel sounds heard. Central nervous system: Alert. No focal neurological deficits. Extremities: No edema. No calf tenderness Skin: No cyanosis. No rashes Psychiatry: Judgement and insight appear normal. Mood & affect appropriate.     Data Reviewed: I have personally reviewed following labs and imaging studies  CBC:  Recent Labs Lab 05/27/16 1628 05/28/16 0518 05/29/16 0524 05/30/16 0532  WBC 8.9 5.9 5.2 7.0  NEUTROABS 7.6  --   --   --   HGB 13.3 12.8 12.0 8.8*  HCT 40.6 39.1 37.0 27.0*  MCV 96.2 94.9 94.4 95.1  PLT 141* 130* 123* 135*   Basic Metabolic Panel:  Recent Labs Lab 05/27/16 1628 05/28/16 0518 05/29/16 0524 05/30/16 0532  NA 132* 139 136 137  K 5.6* 3.1* 3.3* 5.3*  CL 100* 101 99* 104  CO2 27 30 29 27   GLUCOSE 135* 100* 83 123*  BUN 17 14 17  20  CREATININE 0.92 0.88 1.05* 1.14*  CALCIUM 8.5* 8.4* 8.1* 7.7*   GFR: Estimated Creatinine Clearance: 29.5 mL/min (A) (by C-G formula based on SCr of 1.14 mg/dL (H)). Liver Function Tests:  Recent Labs Lab 05/27/16 1628 05/29/16 0524  AST 108* 40  ALT 60* 33  ALKPHOS 162* 94  BILITOT 1.5* 1.6*  PROT 5.5* 4.7*  ALBUMIN 3.3* 2.5*   No results for input(s): LIPASE, AMYLASE in the last 168 hours. No results for input(s): AMMONIA in the last 168 hours. Coagulation Profile:  Recent Labs Lab 05/27/16 1715  INR 1.38   Cardiac Enzymes: No results for input(s): CKTOTAL, CKMB, CKMBINDEX, TROPONINI in the last 168  hours. BNP (last 3 results) No results for input(s): PROBNP in the last 8760 hours. HbA1C: No results for input(s): HGBA1C in the last 72 hours. CBG: No results for input(s): GLUCAP in the last 168 hours. Lipid Profile: No results for input(s): CHOL, HDL, LDLCALC, TRIG, CHOLHDL, LDLDIRECT in the last 72 hours. Thyroid Function Tests: No results for input(s): TSH, T4TOTAL, FREET4, T3FREE, THYROIDAB in the last 72 hours. Anemia Panel: No results for input(s): VITAMINB12, FOLATE, FERRITIN, TIBC, IRON, RETICCTPCT in the last 72 hours. Sepsis Labs: No results for input(s): PROCALCITON, LATICACIDVEN in the last 168 hours.  Recent Results (from the past 240 hour(s))  Urine culture     Status: None   Collection Time: 05/27/16 11:47 PM  Result Value Ref Range Status   Specimen Description URINE, RANDOM  Final   Special Requests NONE  Final   Culture NO GROWTH  Final   Report Status 05/29/2016 FINAL  Final  MRSA PCR Screening     Status: None   Collection Time: 05/27/16 11:59 PM  Result Value Ref Range Status   MRSA by PCR NEGATIVE NEGATIVE Final    Comment:        The GeneXpert MRSA Assay (FDA approved for NASAL specimens only), is one component of a comprehensive MRSA colonization surveillance program. It is not intended to diagnose MRSA infection nor to guide or monitor treatment for MRSA infections.          Radiology Studies: Pelvis Portable  Result Date: 05/29/2016 CLINICAL DATA:  Status post right hip hemiarthroplasty. Initial encounter. EXAM: PORTABLE PELVIS 1-2 VIEWS COMPARISON:  Right hip radiographs performed 05/27/2016 FINDINGS: The patient's right hip hemiarthroplasty is grossly unremarkable in appearance, without evidence of loosening. Overlying postoperative change and soft tissue air are seen. There is no evidence of fracture or dislocation. The left hip joint is grossly unremarkable in appearance. IMPRESSION: Right hip hemiarthroplasty is grossly unremarkable in  appearance, without evidence of loosening. No evidence of fracture. Electronically Signed   By: Roanna Raider M.D.   On: 05/29/2016 21:21   Dg C-arm 61-120 Min  Result Date: 05/29/2016 CLINICAL DATA:  Hemiarthroplasty EXAM: DG C-ARM 61-120 MIN; OPERATIVE RIGHT HIP WITH PELVIS COMPARISON:  05/27/2016 FINDINGS: Total fluoroscopy time was 16 seconds. Two low resolution intraoperative spot films of the right pelvis are submitted. The images demonstrate right hip replacement. Normal hardware alignment. IMPRESSION: Intra operative fluoroscopy provided during right hip replacement. Electronically Signed   By: Jasmine Pang M.D.   On: 05/29/2016 20:38   Dg Hip Operative Unilat W Or W/o Pelvis Right  Result Date: 05/29/2016 CLINICAL DATA:  Hemiarthroplasty EXAM: DG C-ARM 61-120 MIN; OPERATIVE RIGHT HIP WITH PELVIS COMPARISON:  05/27/2016 FINDINGS: Total fluoroscopy time was 16 seconds. Two low resolution intraoperative spot films of the right pelvis are submitted. The  images demonstrate right hip replacement. Normal hardware alignment. IMPRESSION: Intra operative fluoroscopy provided during right hip replacement. Electronically Signed   By: Jasmine Pang M.D.   On: 05/29/2016 20:38   US Abdomen Limited Ruq  Result Date: 05/28/2016 CLINICAL DATA:  83 year old female with elevated transaminases. Initial encounter. EXAM: US ABDOMEN LIMITED - RIGHT UPPER QUADRANT COMPARISON:  04/08/2003 CT. FINDINGS: Gallbladder: Gallstones measuring up to 2.7 cm. No obvious gallbladder wall thickening. Patient was not tender over this region during scanning per ultrasound technologist. Evaluation somewhat limited as patient was not able to turn. Common bile duct: Diameter: 6 mm. Liver: Multiple liver cysts measuring up to 4.9 cm. Right-sided pleural effusion for IMPRESSION: Gallstones. No sonographic evidence of cholecystitis (evaluation slightly limited). Multiple liver cysts. Right-sided pleural effusion. Electronically Signed    By: Lacy Duverney M.D.   On: 05/28/2016 19:39        Scheduled Meds: . amLODipine  2.5 mg Oral Daily  . apixaban  2.5 mg Oral BID  . atorvastatin  40 mg Oral q1800  . carvedilol  6.25 mg Oral BID WC  . donepezil  5 mg Oral QHS  . furosemide  20 mg Oral Daily  . levothyroxine  50 mcg Oral QAC breakfast  . lisinopril  40 mg Oral Daily  . tranexamic acid (CYKLOKAPRON) topical -INTRAOP  2,000 mg Topical To OR   Continuous Infusions: . dextrose 5 % and 0.9% NaCl 75 mL/hr at 05/29/16 1007  . lactated ringers    . lactated ringers 50 mL/hr at 05/29/16 1648     LOS: 3 days     Jacquelin Hawking [facility] 05/30/2016, 4:34 PM Pager: [phone]  If 7PM-7AM, please contact night-coverage [email or web address] Password [facility] 05/30/2016, 4:34 PM

## 2016-05-30 NOTE — Evaluation (Signed)
Occupational Therapy Evaluation Patient Details Name: Tammy Branch MRN: [id number] DOB: Nov 17, 1921 Today's Date: 05/30/2016    History of Present Illness Pt is a 83 y/o female s/p R hemi hip arthroplasty secondary to R femoral neck fracture from a fall. PMH includes dementia, HTN, and parkinsons.    Clinical Impression   Pt admitted with above. She demonstrates the below listed deficits and will benefit from continued OT to maximize safety and independence with BADLs.  Pt presents to OT with pain, generalized weakness, impaired balance, and impaired cognition.  She requires max - total A for ADLs and max A +2 for functional mobility.  Recommend CIR at discharge.       Follow Up Recommendations  SNF    Equipment Recommendations  None recommended by OT    Recommendations for Other Services       Precautions / Restrictions Precautions Precautions: Fall Restrictions Weight Bearing Restrictions: Yes RLE Weight Bearing: Weight bearing as tolerated      Mobility Bed Mobility Overal bed mobility: Needs Assistance Bed Mobility: Supine to Sit;Sit to Supine     Supine to sit: Max assist;+2 for physical assistance Sit to supine: Max assist;+2 for physical assistance   General bed mobility comments: Max A for trunk elevation and LE management secondary to pain in R hip. Verbal cues for sequencing. Able to assist using RLE to scoot hips.   Transfers Overall transfer level: Needs assistance Equipment used: 2 person hand held assist Transfers: Sit to/from Stand Sit to Stand: Max assist;+2 physical assistance         General transfer comment: Max A for lifting. Pt very resistant to movement. Asks if we are trying to finish her and kill her off multiple times throughout mobility. Unable to attempt stand pivot transfer secondary to resistance.     Balance Overall balance assessment: Needs assistance Sitting-balance support: Bilateral upper extremity supported;Feet  supported Sitting balance-Leahy Scale: Poor Sitting balance - Comments: mod-supervision assist required for sitting balance    Standing balance support: Bilateral upper extremity supported Standing balance-Leahy Scale: Poor Standing balance comment: max A +2 to maintain standing.                             ADL Overall ADL's : Needs assistance/impaired Eating/Feeding: Minimal assistance;Bed level   Grooming: Wash/dry hands;Wash/dry face;Oral care;Brushing hair;Minimal assistance;Bed level   Upper Body Bathing: Maximal assistance;Bed level   Lower Body Bathing: Total assistance;Bed level   Upper Body Dressing : Total assistance;Bed level   Lower Body Dressing: Total assistance;Bed level   Toilet Transfer: Total assistance Toilet Transfer Details (indicate cue type and reason): unable  Toileting- Clothing Manipulation and Hygiene: Total assistance;Sit to/from stand Toileting - Clothing Manipulation Details (indicate cue type and reason): Pt incontinent of urine              Vision         Perception     Praxis      Pertinent Vitals/Pain Pain Assessment: Faces Faces Pain Scale: Hurts whole lot Pain Location: R hip  Pain Descriptors / Indicators: Aching;Operative site guarding;Moaning;Guarding;Grimacing;Sore Pain Intervention(s): Limited activity within patient's tolerance     Hand Dominance Right   Extremity/Trunk Assessment Upper Extremity Assessment Upper Extremity Assessment: Generalized weakness   Lower Extremity Assessment Lower Extremity Assessment: Defer to PT evaluation RLE Deficits / Details: Deficits consistent with post op pain and weakness. DF/PF at least 2/5; required assist into DF. Unable to  test other muscle groups secondary to pain.  LLE Deficits / Details: Grossly 3/5 throughout per pt's ability to perform heel slide and PF/DF   Cervical / Trunk Assessment Cervical / Trunk Assessment: Kyphotic   Communication  Communication Communication: Other (comment) (Dementia causing disorientation)   Cognition Arousal/Alertness: Awake/alert Behavior During Therapy: Agitated Overall Cognitive Status: History of cognitive impairments - at baseline                 General Comments: PMH includes dementia. Pt agitated at points throughout the session and cursing at therapist, however, did not become physically agressive. Easily redirectable.    General Comments  grandson present.  Pt resistive to therapies today - question if some of this may be due to pain and confusion     Exercises       Shoulder Instructions      Home Living Family/patient expects to be discharged to:: Skilled nursing facility Living Arrangements: Other (Comment) (grandchildren)                               Additional Comments: Walker, shower chair at home. Lives with grandson. Grandson reports the plan is to go to SNF.       Prior Functioning/Environment Level of Independence: Needs assistance    ADL's / Homemaking Assistance Needed: Lucila Maine helps with ADL tasks like bathing.    Comments: Pt's grandson reports she did not use an AD at home. Had previous fall at home several years ago.         OT Problem List: Decreased strength;Decreased activity tolerance;Impaired balance (sitting and/or standing);Decreased cognition;Decreased safety awareness;Decreased knowledge of use of DME or AE;Decreased knowledge of precautions;Pain      OT Treatment/Interventions: Self-care/ADL training;DME and/or AE instruction;Therapeutic activities;Cognitive remediation/compensation;Patient/family education;Balance training    OT Goals(Current goals can be found in the care plan section) Acute Rehab OT Goals Patient Stated Goal: unable to state OT Goal Formulation: Patient unable to participate in goal setting Time For Goal Achievement: 06/06/16 Potential to Achieve Goals: Good ADL Goals Pt Will Perform Upper Body  Bathing: with min assist;sitting Pt Will Perform Lower Body Bathing: with mod assist;sit to/from stand Pt Will Transfer to Toilet: with mod assist;stand pivot transfer;bedside commode Pt Will Perform Toileting - Clothing Manipulation and hygiene: with mod assist;sit to/from stand  OT Frequency: Min 2X/week   Barriers to D/C: Decreased caregiver support          Co-evaluation PT/OT/SLP Co-Evaluation/Treatment: Yes Reason for Co-Treatment: Complexity of the patient's impairments (multi-system involvement) PT goals addressed during session: Mobility/safety with mobility OT goals addressed during session: ADL's and self-care      End of Session Equipment Utilized During Treatment: Gait belt Nurse Communication: Mobility status  Activity Tolerance: Patient limited by pain Patient left: in bed;with call bell/phone within reach;with bed alarm set  OT Visit Diagnosis: Pain Pain - Right/Left: Right Pain - part of body: Hip                ADL either performed or assessed with clinical judgement  Time: 1351-1415 OT Time Calculation (min): 24 min Charges:  OT General Charges $OT Visit: 1 Procedure OT Evaluation $OT Eval Moderate Complexity: 1 Procedure G-Codes:     Jeani Hawking, OTR/L [phone]   Jeani Hawking M 05/30/2016, 3:40 PM

## 2016-05-31 DIAGNOSIS — D62 Acute posthemorrhagic anemia: Secondary | ICD-10-CM

## 2016-05-31 DIAGNOSIS — W19XXXA Unspecified fall, initial encounter: Secondary | ICD-10-CM

## 2016-05-31 DIAGNOSIS — D696 Thrombocytopenia, unspecified: Secondary | ICD-10-CM

## 2016-05-31 LAB — BASIC METABOLIC PANEL
Anion gap: 6 (ref 5–15)
BUN: 25 mg/dL — AB (ref 6–20)
CO2: 26 mmol/L (ref 22–32)
Calcium: 7.7 mg/dL — ABNORMAL LOW (ref 8.9–10.3)
Chloride: 103 mmol/L (ref 101–111)
Creatinine, Ser: 1.11 mg/dL — ABNORMAL HIGH (ref 0.44–1.00)
GFR calc Af Amer: 48 mL/min — ABNORMAL LOW (ref >60)
GFR calc non Af Amer: 42 mL/min — ABNORMAL LOW (ref >60)
GLUCOSE: 99 mg/dL (ref 65–99)
POTASSIUM: 4.5 mmol/L (ref 3.5–5.1)
Sodium: 135 mmol/L (ref 135–145)

## 2016-05-31 LAB — CBC
HCT: 19.2 % — ABNORMAL LOW (ref 36.0–46.0)
Hemoglobin: 6.4 g/dL — CL (ref 12.0–15.0)
MCH: 31.7 pg (ref 26.0–34.0)
MCHC: 33.3 g/dL (ref 30.0–36.0)
MCV: 95 fL (ref 78.0–100.0)
PLATELETS: 124 10*3/uL — AB (ref 150–400)
RBC: 2.02 MIL/uL — AB (ref 3.87–5.11)
RDW: 14.5 % (ref 11.5–15.5)
WBC: 6.9 10*3/uL (ref 4.0–10.5)

## 2016-05-31 LAB — HEMOGLOBIN AND HEMATOCRIT, BLOOD
HEMATOCRIT: 27.2 % — AB (ref 36.0–46.0)
HEMOGLOBIN: 9.1 g/dL — AB (ref 12.0–15.0)

## 2016-05-31 LAB — PREPARE RBC (CROSSMATCH)

## 2016-05-31 LAB — ABO/RH: ABO/RH(D): A POS

## 2016-05-31 MED ORDER — SODIUM CHLORIDE 0.9 % IV SOLN
Freq: Once | INTRAVENOUS | Status: AC
  Administered 2016-05-31: 13:00:00 via INTRAVENOUS

## 2016-05-31 MED ORDER — POLYETHYLENE GLYCOL 3350 17 G PO PACK
17.0000 g | PACK | Freq: Every day | ORAL | Status: DC
  Administered 2016-05-31 – 2016-06-02 (×3): 17 g via ORAL
  Filled 2016-05-31 (×3): qty 1

## 2016-05-31 NOTE — Consult Note (Signed)
[facility] Surgery Consult Note  Tammy Branch Aug 06, 1921  [id number].    Requesting MD: Lonny Prude Chief Complaint/Reason for Consult: Left inguinal hernia  HPI:  Tammy Branch is a 83yo female admitted to [facility] 05/27/16 with a right hip fracture. She underwent Prosthetic replacement for femoral neck fracture on 05/29/16 by Dr. Erlinda Hong. General surgery has been consulted to evaluate left inguinal hernia noticed earlier today by RN. Per grandson this hernia has been present for several years. She has never complained of pain in this area. She has never had operative fixation for a hernia. States that the hernia enlarges when she coughs, but reduces on its own. She is tolerating a regular diet. Denies n/v. She has not had a BM since admission 4 days ago, but she is passing flatus.  PMH significant for HTN, Acute on chronic diastolic heart failure, Paroxysmal atrial fibrillation, Dementia Abdominal surgical history includes hysterectomy (per chart), patient and grandson do not remember if she has had any other operations  ROS: Review of Systems  Constitutional: Negative.   HENT: Negative.   Eyes: Negative.   Respiratory: Negative.   Cardiovascular: Negative.   Gastrointestinal:       Left inguinal hernia  Genitourinary: Negative.   Musculoskeletal: Positive for joint pain.  Skin: Negative.   Neurological: Negative.    All systems reviewed and otherwise negative except for as above  Family History  Problem Relation Age of Onset  . Stroke Mother   . Cancer Father     prostate    Past Medical History:  Diagnosis Date  . Cellulitis of left lower leg 11/30/2012  . Dementia   . Diverticulosis of colon   . Hyperlipidemia   . Hypertension   . Hypothyroidism   . ONYCHOMYCOSIS, TOENAILS 02/25/2008   Qualifier: Diagnosis of  By: Leanne Chang MD, Bruce    . Parkinson disease ([place])   . S/P right hip fracture 05/2016  . Undiagnosed cardiac murmurs    a. 06/2008 Echo: EF nl, mild LVH,  mild AI, atrial septal aneurysm, mild PR/TR, mildly increased PASP.    Past Surgical History:  Procedure Laterality Date  . ABDOMINAL HYSTERECTOMY    . ANTERIOR APPROACH HEMI HIP ARTHROPLASTY Right 05/29/2016   Procedure: ANTERIOR APPROACH RIGHT HEMI HIP ARTHROPLASTY;  Surgeon: Leandrew Koyanagi, MD;  Location: [place];  Service: Orthopedics;  Laterality: Right;    Social History:  reports that she has never smoked. She has never used smokeless tobacco. She reports that she does not drink alcohol or use drugs.  Allergies:  Allergies  Allergen Reactions  . Citalopram Hydrobromide     REACTION: hallucinations    Medications Prior to Admission  Medication Sig Dispense Refill  . amLODipine (NORVASC) 2.5 MG tablet Take 1 tablet (2.5 mg total) by mouth daily. 90 tablet 1  . apixaban (ELIQUIS) 2.5 MG TABS tablet Take 1 tablet (2.5 mg total) by mouth 2 (two) times daily. 60 tablet 5  . atorvastatin (LIPITOR) 40 MG tablet Take 1 tablet (40 mg total) by mouth daily at 6 PM. 90 tablet 1  . carvedilol (COREG) 6.25 MG tablet Take 1 tablet (6.25 mg total) by mouth 2 (two) times daily with a meal. 180 tablet 1  . donepezil (ARICEPT) 5 MG tablet Take 1 tablet (5 mg total) by mouth at bedtime. 90 tablet 1  . levothyroxine (SYNTHROID, LEVOTHROID) 50 MCG tablet TAKE 1 TABLET (50 MCG TOTAL) BY MOUTH DAILY BEFORE BREAKFAST. 90 tablet 1  . lisinopril (PRINIVIL,ZESTRIL) 40  MG tablet TAKE 1 TABLET (40 MG TOTAL) BY MOUTH DAILY. 90 tablet 3  . clindamycin (CLEOCIN) 300 MG capsule Take 1 capsule (300 mg total) by mouth 3 (three) times daily. (Patient not taking: Reported on 05/27/2016) 15 capsule 0  . traMADol (ULTRAM) 50 MG tablet Take 0.5 tablets (25 mg total) by mouth every 8 (eight) hours as needed for severe pain. (Patient not taking: Reported on 05/27/2016) 20 tablet 0    Prior to Admission medications   Medication Sig Start Date End Date Taking? Authorizing Provider  amLODipine (NORVASC) 2.5 MG tablet Take 1  tablet (2.5 mg total) by mouth daily. 05/08/16  Yes Lucretia Kern, DO  apixaban (ELIQUIS) 2.5 MG TABS tablet Take 1 tablet (2.5 mg total) by mouth 2 (two) times daily. 04/26/16  Yes Lucretia Kern, DO  atorvastatin (LIPITOR) 40 MG tablet Take 1 tablet (40 mg total) by mouth daily at 6 PM. 05/08/16  Yes Lucretia Kern, DO  carvedilol (COREG) 6.25 MG tablet Take 1 tablet (6.25 mg total) by mouth 2 (two) times daily with a meal. 05/08/16  Yes Lucretia Kern, DO  donepezil (ARICEPT) 5 MG tablet Take 1 tablet (5 mg total) by mouth at bedtime. 05/08/16  Yes Lucretia Kern, DO  levothyroxine (SYNTHROID, LEVOTHROID) 50 MCG tablet TAKE 1 TABLET (50 MCG TOTAL) BY MOUTH DAILY BEFORE BREAKFAST. 02/16/16  Yes Lucretia Kern, DO  lisinopril (PRINIVIL,ZESTRIL) 40 MG tablet TAKE 1 TABLET (40 MG TOTAL) BY MOUTH DAILY. 10/24/15  Yes Lucretia Kern, DO  clindamycin (CLEOCIN) 300 MG capsule Take 1 capsule (300 mg total) by mouth 3 (three) times daily. Patient not taking: Reported on 05/27/2016 05/03/16   Lucretia Kern, DO  furosemide (LASIX) 20 MG tablet TAKE 1 TABLET BY MOUTH EVERY DAY IN THE MORNING AS NEEDED FOR SWELLING IN LEGS 05/30/16   Lucretia Kern, DO  HYDROcodone-acetaminophen (NORCO) 7.5-325 MG tablet Take 1-2 tablets by mouth every 6 (six) hours as needed for moderate pain. 05/29/16   Naiping Ephriam Jenkins, MD  traMADol (ULTRAM) 50 MG tablet Take 0.5 tablets (25 mg total) by mouth every 8 (eight) hours as needed for severe pain. Patient not taking: Reported on 05/27/2016 03/11/16   Barton Dubois, MD    Blood pressure (!) 123/31, pulse 65, temperature 98 F (36.7 C), temperature source Oral, resp. rate 16, height '5\' 6"'$  (1.676 m), weight 132 lb 3.2 oz (60 kg), SpO2 98 %. Physical Exam: General: pleasant, laconic, WD/WN white female who is sitting up in chair HEENT: hard of hearing. head is normocephalic, atraumatic.  Sclera are noninjected.  Mouth is pink and moist Heart: regular, rate, and rhythm.  No obvious murmurs, gallops, or rubs  noted.  Palpable pedal pulses bilaterally Lungs: CTAB, no wheezes, rhonchi, or rales noted.  Respiratory effort nonlabored Abd: well healed midline incision, soft, NT/ND, +BS, no masses or organomegaly. Soft, reducible left inguinal hernia that is nontender and has no overlying skin changes MS: all 4 extremities are symmetrical with no cyanosis, clubbing, or edema. Dressing to right hip. Skin: warm and dry with no masses, lesions, or rashes Psych: patient is alert and oriented to self, will not answer other questions regarding orientation; laconic Neuro: CM 2-12 intact, extremity CSM intact bilaterally, normal speech  Results for orders placed or performed during the hospital encounter of 05/27/16 (from the past 48 hour(s))  Basic metabolic panel     Status: Abnormal   Collection Time: 05/30/16  5:32 AM  Result Value Ref Range   Sodium 137 135 - 145 mmol/L   Potassium 5.3 (H) 3.5 - 5.1 mmol/L    Comment: DELTA CHECK NOTED NO VISIBLE HEMOLYSIS    Chloride 104 101 - 111 mmol/L   CO2 27 22 - 32 mmol/L   Glucose, Bld 123 (H) 65 - 99 mg/dL   BUN 20 6 - 20 mg/dL   Creatinine, Ser 1.14 (H) 0.44 - 1.00 mg/dL   Calcium 7.7 (L) 8.9 - 10.3 mg/dL   GFR calc non Af Amer 40 (L) >60 mL/min   GFR calc Af Amer 47 (L) >60 mL/min    Comment: (NOTE) The eGFR has been calculated using the CKD EPI equation. This calculation has not been validated in all clinical situations. eGFR's persistently <60 mL/min signify possible Chronic Kidney Disease.    Anion gap 6 5 - 15  CBC     Status: Abnormal   Collection Time: 05/30/16  5:32 AM  Result Value Ref Range   WBC 7.0 4.0 - 10.5 K/uL   RBC 2.84 (L) 3.87 - 5.11 MIL/uL   Hemoglobin 8.8 (L) 12.0 - 15.0 g/dL    Comment: DELTA CHECK NOTED REPEATED TO VERIFY    HCT 27.0 (L) 36.0 - 46.0 %   MCV 95.1 78.0 - 100.0 fL   MCH 31.0 26.0 - 34.0 pg   MCHC 32.6 30.0 - 36.0 g/dL   RDW 14.0 11.5 - 15.5 %   Platelets 135 (L) 150 - 400 K/uL  CBC     Status: Abnormal    Collection Time: 05/31/16  6:40 AM  Result Value Ref Range   WBC 6.9 4.0 - 10.5 K/uL   RBC 2.02 (L) 3.87 - 5.11 MIL/uL   Hemoglobin 6.4 (LL) 12.0 - 15.0 g/dL    Comment: REPEATED TO VERIFY CRITICAL RESULT CALLED TO, READ BACK BY AND VERIFIED WITH: PEARSE,M RN @ [phone] 05/31/16 LEONARD,A    HCT 19.2 (L) 36.0 - 46.0 %   MCV 95.0 78.0 - 100.0 fL   MCH 31.7 26.0 - 34.0 pg   MCHC 33.3 30.0 - 36.0 g/dL   RDW 14.5 11.5 - 15.5 %   Platelets 124 (L) 150 - 400 K/uL  Basic metabolic panel     Status: Abnormal   Collection Time: 05/31/16  6:40 AM  Result Value Ref Range   Sodium 135 135 - 145 mmol/L   Potassium 4.5 3.5 - 5.1 mmol/L   Chloride 103 101 - 111 mmol/L   CO2 26 22 - 32 mmol/L   Glucose, Bld 99 65 - 99 mg/dL   BUN 25 (H) 6 - 20 mg/dL   Creatinine, Ser 1.11 (H) 0.44 - 1.00 mg/dL   Calcium 7.7 (L) 8.9 - 10.3 mg/dL   GFR calc non Af Amer 42 (L) >60 mL/min   GFR calc Af Amer 48 (L) >60 mL/min    Comment: (NOTE) The eGFR has been calculated using the CKD EPI equation. This calculation has not been validated in all clinical situations. eGFR's persistently <60 mL/min signify possible Chronic Kidney Disease.    Anion gap 6 5 - 15  Type and screen [place]     Status: None (Preliminary result)   Collection Time: 05/31/16 11:22 AM  Result Value Ref Range   ABO/RH(D) A POS    Antibody Screen NEG    Sample Expiration 06/03/2016    Unit Number [id number]    Blood Component Type RED CELLS,LR    Unit division 00  Status of Unit ALLOCATED    Transfusion Status OK TO TRANSFUSE    Crossmatch Result Compatible    Unit Number [id number]    Blood Component Type RED CELLS,LR    Unit division 00    Status of Unit ALLOCATED    Transfusion Status OK TO TRANSFUSE    Crossmatch Result Compatible   Prepare RBC     Status: None   Collection Time: 05/31/16 11:22 AM  Result Value Ref Range   Order Confirmation ORDER PROCESSED BY BLOOD BANK   ABO/Rh     Status: None    Collection Time: 05/31/16 11:22 AM  Result Value Ref Range   ABO/RH(D) A POS    Pelvis Portable  Result Date: 05/29/2016 CLINICAL DATA:  Status post right hip hemiarthroplasty. Initial encounter. EXAM: PORTABLE PELVIS 1-2 VIEWS COMPARISON:  Right hip radiographs performed 05/27/2016 FINDINGS: The patient's right hip hemiarthroplasty is grossly unremarkable in appearance, without evidence of loosening. Overlying postoperative change and soft tissue air are seen. There is no evidence of fracture or dislocation. The left hip joint is grossly unremarkable in appearance. IMPRESSION: Right hip hemiarthroplasty is grossly unremarkable in appearance, without evidence of loosening. No evidence of fracture. Electronically Signed   By: Garald Balding M.D.   On: 05/29/2016 21:21   Dg C-arm 61-120 Min  Result Date: 05/29/2016 CLINICAL DATA:  Hemiarthroplasty EXAM: DG C-ARM 61-120 MIN; OPERATIVE RIGHT HIP WITH PELVIS COMPARISON:  05/27/2016 FINDINGS: Total fluoroscopy time was 16 seconds. Two low resolution intraoperative spot films of the right pelvis are submitted. The images demonstrate right hip replacement. Normal hardware alignment. IMPRESSION: Intra operative fluoroscopy provided during right hip replacement. Electronically Signed   By: Donavan Foil M.D.   On: 05/29/2016 20:38   Dg Hip Operative Unilat W Or W/o Pelvis Right  Result Date: 05/29/2016 CLINICAL DATA:  Hemiarthroplasty EXAM: DG C-ARM 61-120 MIN; OPERATIVE RIGHT HIP WITH PELVIS COMPARISON:  05/27/2016 FINDINGS: Total fluoroscopy time was 16 seconds. Two low resolution intraoperative spot films of the right pelvis are submitted. The images demonstrate right hip replacement. Normal hardware alignment. IMPRESSION: Intra operative fluoroscopy provided during right hip replacement. Electronically Signed   By: Donavan Foil M.D.   On: 05/29/2016 20:38      Assessment/Plan S/p Prosthetic replacement for femoral neck fracture 05/29/16 Dr.  Erlinda Hong HTN Acute on chronic diastolic heart failure Paroxysmal atrial fibrillation Dementia  Left inguinal hernia - per grandson hernia has been present for several years - soft and reducible   Plan - Left inguinal hernia is soft, reducible, and nontender. Patient is tolerating diet and passing flatus. Would not recommend any surgical intervention. Ice as needed for swelling. Will add miralax to avoid constipation as she is requiring narcotics for postop pain.  General surgery will sign off, please call with any concerns (ie if the hernia becomes obstructed and she starts to have pain, nausea, vomiting, and decreased bowel function).  Jerrye Beavers, [facility] Surgery 05/31/2016, 1:33 PM Pager: [phone] Consults: [phone] Mon-Fri 7:00 am-4:30 pm Sat-Sun 7:00 am-11:30 am

## 2016-05-31 NOTE — Progress Notes (Signed)
PROGRESS NOTE    Tammy Branch  ZOX:[id number] DOB: 04/19/1921 DOA: 05/27/2016 PCP: [name]., DO   Brief Narrative: Tammy Branch is a 83 y.o. femalewith known HTN, a-fib on eliquis, hypothyroidism, parkinson's, chronic diastolic CHF, presented to [facility] ED after an episode of fall at home, tripping on the carpet and falling the right side. She was found to have a right hip fracture and underwent right hemi hip arthroplasty on 05/29/2016   Assessment & Plan:   Principal Problem:   Closed right hip fracture, initial encounter [facility]) Active Problems:   Essential hypertension   Paroxysmal atrial fibrillation (HCC)   Elevated transaminase level   Thrombocytopenia (HCC)   Acute posthemorrhagic anemia   Right femoral neck fracture s/p right hemi-hip arthroplasty -continue Eliquis. May need to hold if hemoglobin continues to decline -PT/OT recommendations: SNF  Elevated transaminases History of gallstones without cholecystitis. Asymptomatic. Resolved.  Essential hypertension -continue antihypertensives  Thrombocytopenia Reactive. Improved.  Acute on chronic diastolic heart failure -continue Lasix  Hypokalemia Resolved.  Paroxysmal atrial fibrillation Rate controlled. On Eliquis as an outpatient -Eliquis -Carvedilol  Dementia Baseline.    DVT prophylaxis: Eliquis Code Status: DNR/DNI Family Communication: None at bedside Disposition Plan: Discharge to SNF   Consultants:   Orthopedic surgery  Procedures:   Right hemi hip arthroplasty  Antimicrobials:   Cefazolin (pre-procedure)    Subjective: Pain with minimal pain. No chest pain or dyspnea. No lightheadedness  Objective: Vitals:   05/30/16 1457 05/30/16 1600 05/30/16 2030 05/31/16 0600  BP: (!) 132/33 (!) 142/41 (!) 148/49 (!) 123/31  Pulse: 60 60 60 65  Resp:    16  Temp: 97.6 F (36.4 C)  98.3 F (36.8 C) 98 F (36.7 C)  TempSrc: Axillary  Axillary Oral  SpO2: 98%  97% 98%    Weight:      Height:        Intake/Output Summary (Last 24 hours) at 05/31/16 1246 Last data filed at 05/31/16 0755  Gross per 24 hour  Intake              240 ml  Output                0 ml  Net              240 ml   Filed Weights   05/28/16 0500 05/29/16 0746 05/30/16 0700  Weight: 55.5 kg (122 lb 6.4 oz) 57.1 kg (125 lb 14.4 oz) 60 kg (132 lb 3.2 oz)    Examination:  General exam: Appears calm and comfortable Respiratory system: Clear to auscultation. Respiratory effort normal. Cardiovascular system: S1 & S2 heard, RRR. 2/6 systolic murmur Gastrointestinal system: Abdomen is nondistended, soft and nontender. Normal bowel sounds heard. Central nervous system: Alert. No focal neurological deficits. Extremities: No edema. No calf tenderness Skin: No cyanosis. No rashes. Slightly pale Psychiatry: Judgement and insight appear normal. Mood & affect appropriate.     Data Reviewed: I have personally reviewed following labs and imaging studies  CBC:  Recent Labs Lab 05/27/16 1628 05/28/16 0518 05/29/16 0524 05/30/16 0532 05/31/16 0640  WBC 8.9 5.9 5.2 7.0 6.9  NEUTROABS 7.6  --   --   --   --   HGB 13.3 12.8 12.0 8.8* 6.4*  HCT 40.6 39.1 37.0 27.0* 19.2*  MCV 96.2 94.9 94.4 95.1 95.0  PLT 141* 130* 123* 135* 124*   Basic Metabolic Panel:  Recent Labs Lab 05/27/16 1628 05/28/16 0518 05/29/16 0524 05/30/16  0532 05/31/16 0640  NA 132* 139 136 137 135  K 5.6* 3.1* 3.3* 5.3* 4.5  CL 100* 101 99* 104 103  CO2 27 30 29 27 26   GLUCOSE 135* 100* 83 123* 99  BUN 17 14 17 20  25*  CREATININE 0.92 0.88 1.05* 1.14* 1.11*  CALCIUM 8.5* 8.4* 8.1* 7.7* 7.7*   GFR: Estimated Creatinine Clearance: 30.3 mL/min (A) (by C-G formula based on SCr of 1.11 mg/dL (H)). Liver Function Tests:  Recent Labs Lab 05/27/16 1628 05/29/16 0524  AST 108* 40  ALT 60* 33  ALKPHOS 162* 94  BILITOT 1.5* 1.6*  PROT 5.5* 4.7*  ALBUMIN 3.3* 2.5*   No results for input(s): LIPASE,  AMYLASE in the last 168 hours. No results for input(s): AMMONIA in the last 168 hours. Coagulation Profile:  Recent Labs Lab 05/27/16 1715  INR 1.38   Cardiac Enzymes: No results for input(s): CKTOTAL, CKMB, CKMBINDEX, TROPONINI in the last 168 hours. BNP (last 3 results) No results for input(s): PROBNP in the last 8760 hours. HbA1C: No results for input(s): HGBA1C in the last 72 hours. CBG: No results for input(s): GLUCAP in the last 168 hours. Lipid Profile: No results for input(s): CHOL, HDL, LDLCALC, TRIG, CHOLHDL, LDLDIRECT in the last 72 hours. Thyroid Function Tests: No results for input(s): TSH, T4TOTAL, FREET4, T3FREE, THYROIDAB in the last 72 hours. Anemia Panel: No results for input(s): VITAMINB12, FOLATE, FERRITIN, TIBC, IRON, RETICCTPCT in the last 72 hours. Sepsis Labs: No results for input(s): PROCALCITON, LATICACIDVEN in the last 168 hours.  Recent Results (from the past 240 hour(s))  Urine culture     Status: None   Collection Time: 05/27/16 11:47 PM  Result Value Ref Range Status   Specimen Description URINE, RANDOM  Final   Special Requests NONE  Final   Culture NO GROWTH  Final   Report Status 05/29/2016 FINAL  Final  MRSA PCR Screening     Status: None   Collection Time: 05/27/16 11:59 PM  Result Value Ref Range Status   MRSA by PCR NEGATIVE NEGATIVE Final    Comment:        The GeneXpert MRSA Assay (FDA approved for NASAL specimens only), is one component of a comprehensive MRSA colonization surveillance program. It is not intended to diagnose MRSA infection nor to guide or monitor treatment for MRSA infections.          Radiology Studies: Pelvis Portable  Result Date: 05/29/2016 CLINICAL DATA:  Status post right hip hemiarthroplasty. Initial encounter. EXAM: PORTABLE PELVIS 1-2 VIEWS COMPARISON:  Right hip radiographs performed 05/27/2016 FINDINGS: The patient's right hip hemiarthroplasty is grossly unremarkable in appearance, without  evidence of loosening. Overlying postoperative change and soft tissue air are seen. There is no evidence of fracture or dislocation. The left hip joint is grossly unremarkable in appearance. IMPRESSION: Right hip hemiarthroplasty is grossly unremarkable in appearance, without evidence of loosening. No evidence of fracture. Electronically Signed   By: Roanna Raider M.D.   On: 05/29/2016 21:21   Dg C-arm 61-120 Min  Result Date: 05/29/2016 CLINICAL DATA:  Hemiarthroplasty EXAM: DG C-ARM 61-120 MIN; OPERATIVE RIGHT HIP WITH PELVIS COMPARISON:  05/27/2016 FINDINGS: Total fluoroscopy time was 16 seconds. Two low resolution intraoperative spot films of the right pelvis are submitted. The images demonstrate right hip replacement. Normal hardware alignment. IMPRESSION: Intra operative fluoroscopy provided during right hip replacement. Electronically Signed   By: Jasmine Pang M.D.   On: 05/29/2016 20:38   Dg Hip Operative Unilat  W Or W/o Pelvis Right  Result Date: 05/29/2016 CLINICAL DATA:  Hemiarthroplasty EXAM: DG C-ARM 61-120 MIN; OPERATIVE RIGHT HIP WITH PELVIS COMPARISON:  05/27/2016 FINDINGS: Total fluoroscopy time was 16 seconds. Two low resolution intraoperative spot films of the right pelvis are submitted. The images demonstrate right hip replacement. Normal hardware alignment. IMPRESSION: Intra operative fluoroscopy provided during right hip replacement. Electronically Signed   By: Jasmine Pang M.D.   On: 05/29/2016 20:38        Scheduled Meds: . amLODipine  2.5 mg Oral Daily  . apixaban  2.5 mg Oral BID  . atorvastatin  40 mg Oral q1800  . carvedilol  6.25 mg Oral BID WC  . donepezil  5 mg Oral QHS  . furosemide  20 mg Oral Daily  . levothyroxine  50 mcg Oral QAC breakfast  . lisinopril  40 mg Oral Daily   Continuous Infusions: . dextrose 5 % and 0.9% NaCl 75 mL/hr at 05/29/16 1007  . lactated ringers    . lactated ringers 50 mL/hr at 05/29/16 1648     LOS: 4 days     Jacquelin Hawking [facility] 05/31/2016, 12:46 PM Pager: [phone]  If 7PM-7AM, please contact night-coverage [email or web address] Password [facility] 05/31/2016, 12:46 PM

## 2016-05-31 NOTE — Care Management Important Message (Signed)
Important Message  Patient Details  Name: Tammy Branch MRN: [id number] Date of Birth: 14-May-1921   Medicare Important Message Given:       Dorena Bodo 05/31/2016, 1:51 PM

## 2016-05-31 NOTE — Progress Notes (Signed)
   Subjective:  Patient reports pain as mild.  No events.  Objective:   VITALS:   Vitals:   05/30/16 1457 05/30/16 1600 05/30/16 2030 05/31/16 0600  BP: (!) 132/33 (!) 142/41 (!) 148/49 (!) 123/31  Pulse: 60 60 60 65  Resp:    16  Temp: 97.6 F (36.4 C)  98.3 F (36.8 C) 98 F (36.7 C)  TempSrc: Axillary  Axillary Oral  SpO2: 98%  97% 98%  Weight:      Height:        Neurologically intact Neurovascular intact Sensation intact distally Intact pulses distally Dorsiflexion/Plantar flexion intact Incision: dressing C/D/I and no drainage No cellulitis present Compartment soft   Lab Results  Component Value Date   WBC 6.9 05/31/2016   HGB 6.4 (LL) 05/31/2016   HCT 19.2 (L) 05/31/2016   MCV 95.0 05/31/2016   PLT 124 (L) 05/31/2016     Assessment/Plan:  2 Days Post-Op   - Expected postop acute blood loss anemia - will monitor for symptoms - may dc to SNF once Hgb stabilizes - agree with transfusion  Glee Arvin 05/31/2016, 12:09 PM [phone]

## 2016-05-31 NOTE — Progress Notes (Signed)
Assessed Tammy Branch has a lft bulge the size of a grapefruit in inguinal region. Nurse notice that it is soft unless Tammy Branch is coughing. NP Craige Cotta has been notified will cont. To assess.

## 2016-06-01 ENCOUNTER — Inpatient Hospital Stay (HOSPITAL_COMMUNITY): Payer: Medicare Other

## 2016-06-01 DIAGNOSIS — R109 Unspecified abdominal pain: Secondary | ICD-10-CM

## 2016-06-01 LAB — CBC
HCT: 25.7 % — ABNORMAL LOW (ref 36.0–46.0)
HEMATOCRIT: 25.7 % — AB (ref 36.0–46.0)
HEMOGLOBIN: 8.6 g/dL — AB (ref 12.0–15.0)
Hemoglobin: 8.6 g/dL — ABNORMAL LOW (ref 12.0–15.0)
MCH: 30.9 pg (ref 26.0–34.0)
MCH: 31.3 pg (ref 26.0–34.0)
MCHC: 33.5 g/dL (ref 30.0–36.0)
MCHC: 33.5 g/dL (ref 30.0–36.0)
MCV: 92.4 fL (ref 78.0–100.0)
MCV: 93.5 fL (ref 78.0–100.0)
PLATELETS: 139 10*3/uL — AB (ref 150–400)
Platelets: 133 10*3/uL — ABNORMAL LOW (ref 150–400)
RBC: 2.78 MIL/uL — AB (ref 3.87–5.11)
RBC: 2.75 MIL/uL — ABNORMAL LOW (ref 3.87–5.11)
RDW: 14.8 % (ref 11.5–15.5)
RDW: 14.9 % (ref 11.5–15.5)
WBC: 6.5 10*3/uL (ref 4.0–10.5)
WBC: 5.4 10*3/uL (ref 4.0–10.5)

## 2016-06-01 LAB — BPAM RBC
Blood Product Expiration Date: 201803272359
Blood Product Expiration Date: 201803282359
ISSUE DATE / TIME: 201803221730
ISSUE DATE / TIME: 201803221346
UNIT TYPE AND RH: 600
Unit Type and Rh: 600

## 2016-06-01 LAB — TYPE AND SCREEN
ABO/RH(D): A POS
ANTIBODY SCREEN: NEGATIVE
UNIT DIVISION: 0
Unit division: 0

## 2016-06-01 MED ORDER — BISACODYL 10 MG RE SUPP
10.0000 mg | Freq: Every day | RECTAL | Status: DC | PRN
  Administered 2016-06-01 – 2016-06-02 (×2): 10 mg via RECTAL
  Filled 2016-06-01 (×2): qty 1

## 2016-06-01 NOTE — Progress Notes (Signed)
qPhysical Therapy Treatment Patient Details Name: Tammy Branch MRN: [id number] DOB: October 16, 1921 Today's Date: 06/01/2016    History of Present Illness Pt is a 83 y/o female s/p R hemi hip arthroplasty secondary to R femoral neck fracture from a fall. PMH includes dementia, HTN, and parkinsons.     PT Comments    Pt increased tolerance for stand pivot transfer this session. Continues to require max A +2 for mobility. Unable to follow verbal commands to assist with mobility. Pt showed signs of fear of falling by grasping onto bed rails before transfers; required manual assist to remove UE from rail to complete transfer. Continue to recommend d/c recommendations below. Will continue to follow.    Follow Up Recommendations  SNF;Supervision/Assistance - 24 hour     Equipment Recommendations  Wheelchair (measurements PT);Wheelchair cushion (measurements PT);Hospital bed    Recommendations for Other Services       Precautions / Restrictions Precautions Precautions: Fall Restrictions Weight Bearing Restrictions: Yes RLE Weight Bearing: Weight bearing as tolerated    Mobility  Bed Mobility Overal bed mobility: Needs Assistance Bed Mobility: Supine to Sit     Supine to sit: Max assist;+2 for physical assistance     General bed mobility comments: Max A +2 for BLE management and for trunk elevation. Unable to follow verbal commands to assist with transfer.   Transfers Overall transfer level: Needs assistance Equipment used: 2 person hand held assist Transfers: Sit to/from [employer] Sit to Stand: Max assist;+2 physical assistance Stand pivot transfers: Max assist;+2 physical assistance       General transfer comment: Max A +2 for lift assist. Less resistive to movement, however, pt grasping onto hand rails when standing. Unable to follow verbal commands to let go of handrail, so required manual assist, so transfer could be completed.   Ambulation/Gait                  Stairs            Wheelchair Mobility    Modified Rankin (Stroke Patients Only)       Balance Overall balance assessment: Needs assistance Sitting-balance support: Bilateral upper extremity supported;Feet supported Sitting balance-Leahy Scale: Poor Sitting balance - Comments: mod A-supervision required for sitting balance    Standing balance support: Bilateral upper extremity supported Standing balance-Leahy Scale: Poor Standing balance comment: max A +2 to maintain standing.                             Cognition Arousal/Alertness: Awake/alert Behavior During Therapy: Flat affect Overall Cognitive Status: History of cognitive impairments - at baseline                                 General Comments: PMH includes dementia. Pt not responding to questions and not responding to verbal commands to participate in ther ex.       Exercises Total Joint Exercises Ankle Circles/Pumps: Both;10 reps;Seated;PROM (recliner; required assist from PT. ) Heel Slides: PROM;Right;10 reps;Seated (recliner; unable to assist with heelslide)    General Comments General comments (skin integrity, edema, etc.): Pt son and grandson present throughout session. Pt not responding to verbal commands this session, and not agitated.       Pertinent Vitals/Pain Pain Assessment: Faces Faces Pain Scale: Hurts little more Pain Location: R hip  Pain Descriptors / Indicators: Aching;Operative site guarding;Moaning;Guarding;Grimacing;Sore Pain Intervention(s): Limited  activity within patient's tolerance;Monitored during session;Repositioned    Home Living                      Prior Function            PT Goals (current goals can now be found in the care plan section) Acute Rehab PT Goals Patient Stated Goal: unable to state PT Goal Formulation: With patient Time For Goal Achievement: 06/06/16 Potential to Achieve Goals: Fair Progress towards  PT goals: Progressing toward goals    Frequency    Min 3X/week      PT Plan Current plan remains appropriate    Co-evaluation             End of Session Equipment Utilized During Treatment: Gait belt Activity Tolerance: Patient limited by pain Patient left: in chair;with call bell/phone within reach;with chair alarm set;with family/visitor present Nurse Communication: Mobility status PT Visit Diagnosis: Other abnormalities of gait and mobility (R26.89);Pain Pain - Right/Left: Right Pain - part of body: Hip     Time: 1610-9604 PT Time Calculation (min) (ACUTE ONLY): 18 min  Charges:  $Therapeutic Activity: 8-22 mins                    G Codes:       Margot Chimes, PT, DPT  Acute Rehabilitation Services  Pager: [phone]   Melvyn Novas 06/01/2016, 2:45 PM

## 2016-06-01 NOTE — Progress Notes (Addendum)
PROGRESS NOTE    Tammy Branch  RUE:[id number] DOB: 1921/04/25 DOA: 05/27/2016 PCP: [name]., DO   Brief Narrative: Tammy Branch is a 83 y.o. femalewith known HTN, a-fib on eliquis, hypothyroidism, parkinson's, chronic diastolic CHF, presented to [facility] ED after an episode of fall at home, tripping on the carpet and falling the right side. She was found to have a right hip fracture and underwent right hemi hip arthroplasty on 05/29/2016   Assessment & Plan:   Principal Problem:   Closed right hip fracture, initial encounter [facility]) Active Problems:   Essential hypertension   Paroxysmal atrial fibrillation (HCC)   Elevated transaminase level   Thrombocytopenia (HCC)   Acute posthemorrhagic anemia   Right femoral neck fracture s/p right hemi-hip arthroplasty -continue Eliquis. May need to hold if hemoglobin continues to decline -PT/OT recommendations: SNF  Elevated transaminases History of gallstones without cholecystitis. Asymptomatic. Resolved.  Essential hypertension -continue antihypertensives  Thrombocytopenia Reactive. Improved.  Acute on chronic diastolic heart failure -continue Lasix  Hypokalemia Resolved.  Paroxysmal atrial fibrillation Rate controlled. On Eliquis as an outpatient -hold Eliquis with hemoglobin drop. Discussed with son, HCPOA who is in agreement. Recommend continued discussion as an outpatient. -Carvedilol  Dementia Baseline.  Constipation -suppository -continue Miralax  Post-op anemia s/p PRBC. Stablized.   DVT prophylaxis: SCD Code Status: DNR/DNI Family Communication: None at bedside, discussed with son over the phone Disposition Plan: Discharge to SNF   Consultants:   Orthopedic surgery  Procedures:   Right hemi hip arthroplasty  2 units PRBC  Antimicrobials:   Cefazolin (pre-procedure)    Subjective: Pain with minimal pain. No chest pain or dyspnea. No lightheadedness  Objective: Vitals:   05/31/16 1756 05/31/16 2055 06/01/16 0620 06/01/16 1000  BP: (!) 143/32 (!) 156/41 (!) 146/36 (!) 152/46  Pulse: (!) 59 92 (!) 57   Resp: 18 16 16    Temp: 97.6 F (36.4 C) 98 F (36.7 C) 97.5 F (36.4 C) 99.4 F (37.4 C)  TempSrc: Oral Oral Oral Oral  SpO2: 95% 94% 93% 91%  Weight:   60.8 kg (134 lb 1.6 oz)   Height:        Intake/Output Summary (Last 24 hours) at 06/01/16 1401 Last data filed at 05/31/16 2055  Gross per 24 hour  Intake              764 ml  Output                0 ml  Net              764 ml   Filed Weights   05/29/16 0746 05/30/16 0700 06/01/16 0620  Weight: 57.1 kg (125 lb 14.4 oz) 60 kg (132 lb 3.2 oz) 60.8 kg (134 lb 1.6 oz)    Examination:  General exam: Appears calm and comfortable Respiratory system: Clear to auscultation. Respiratory effort normal. Cardiovascular system: S1 & S2 heard, RRR. 2/6 systolic murmur Gastrointestinal system: Abdomen is nondistended, soft and nontender. Normal bowel sounds heard. Central nervous system: Alert. No focal neurological deficits. Extremities: No edema. No calf tenderness Skin: No cyanosis. No rashes. Psychiatry: Judgement and insight appear normal. Mood & affect appropriate.     Data Reviewed: I have personally reviewed following labs and imaging studies  CBC:  Recent Labs Lab 05/27/16 1628  05/29/16 0524 05/30/16 0532 05/31/16 0640 05/31/16 2135 06/01/16 0239 06/01/16 1131  WBC 8.9  < > 5.2 7.0 6.9  --  6.5 5.4  NEUTROABS 7.6  --   --   --   --   --   --   --  HGB 13.3  < > 12.0 8.8* 6.4* 9.1* 8.6* 8.6*  HCT 40.6  < > 37.0 27.0* 19.2* 27.2* 25.7* 25.7*  MCV 96.2  < > 94.4 95.1 95.0  --  92.4 93.5  PLT 141*  < > 123* 135* 124*  --  133* 139*  < > = values in this interval not displayed. Basic Metabolic Panel:  Recent Labs Lab 05/27/16 1628 05/28/16 0518 05/29/16 0524 05/30/16 0532 05/31/16 0640  NA 132* 139 136 137 135  K 5.6* 3.1* 3.3* 5.3* 4.5  CL 100* 101 99* 104 103  CO2 27 30  29 27 26   GLUCOSE 135* 100* 83 123* 99  BUN 17 14 17 20  25*  CREATININE 0.92 0.88 1.05* 1.14* 1.11*  CALCIUM 8.5* 8.4* 8.1* 7.7* 7.7*   GFR: Estimated Creatinine Clearance: 30.3 mL/min (A) (by C-G formula based on SCr of 1.11 mg/dL (H)). Liver Function Tests:  Recent Labs Lab 05/27/16 1628 05/29/16 0524  AST 108* 40  ALT 60* 33  ALKPHOS 162* 94  BILITOT 1.5* 1.6*  PROT 5.5* 4.7*  ALBUMIN 3.3* 2.5*   No results for input(s): LIPASE, AMYLASE in the last 168 hours. No results for input(s): AMMONIA in the last 168 hours. Coagulation Profile:  Recent Labs Lab 05/27/16 1715  INR 1.38   Cardiac Enzymes: No results for input(s): CKTOTAL, CKMB, CKMBINDEX, TROPONINI in the last 168 hours. BNP (last 3 results) No results for input(s): PROBNP in the last 8760 hours. HbA1C: No results for input(s): HGBA1C in the last 72 hours. CBG: No results for input(s): GLUCAP in the last 168 hours. Lipid Profile: No results for input(s): CHOL, HDL, LDLCALC, TRIG, CHOLHDL, LDLDIRECT in the last 72 hours. Thyroid Function Tests: No results for input(s): TSH, T4TOTAL, FREET4, T3FREE, THYROIDAB in the last 72 hours. Anemia Panel: No results for input(s): VITAMINB12, FOLATE, FERRITIN, TIBC, IRON, RETICCTPCT in the last 72 hours. Sepsis Labs: No results for input(s): PROCALCITON, LATICACIDVEN in the last 168 hours.  Recent Results (from the past 240 hour(s))  Urine culture     Status: None   Collection Time: 05/27/16 11:47 PM  Result Value Ref Range Status   Specimen Description URINE, RANDOM  Final   Special Requests NONE  Final   Culture NO GROWTH  Final   Report Status 05/29/2016 FINAL  Final  MRSA PCR Screening     Status: None   Collection Time: 05/27/16 11:59 PM  Result Value Ref Range Status   MRSA by PCR NEGATIVE NEGATIVE Final    Comment:        The GeneXpert MRSA Assay (FDA approved for NASAL specimens only), is one component of a comprehensive MRSA colonization surveillance  program. It is not intended to diagnose MRSA infection nor to guide or monitor treatment for MRSA infections.          Radiology Studies: Dg Abd Portable 1v  Result Date: 06/01/2016 CLINICAL DATA:  Constipation EXAM: PORTABLE ABDOMEN - 1 VIEW COMPARISON:  None. FINDINGS: There is normal small bowel gas pattern. Moderate stool noted within right colon. Moderate gas noted transverse colon descending colon and sigmoid colon. Moderate stool noted within rectum. The rectum measures about 6 cm in diameter. Mild fecal impaction cannot be excluded. IMPRESSION: Normal small bowel gas pattern. Colonic stool and gas as described above. Moderate stool noted within rectum. The rectum measures 6 cm in diameter suspicious for mild fecal impaction. Electronically Signed   By: Natasha Mead M.D.   On: 06/01/2016 12:52  Scheduled Meds: . amLODipine  2.5 mg Oral Daily  . apixaban  2.5 mg Oral BID  . atorvastatin  40 mg Oral q1800  . carvedilol  6.25 mg Oral BID WC  . donepezil  5 mg Oral QHS  . furosemide  20 mg Oral Daily  . levothyroxine  50 mcg Oral QAC breakfast  . lisinopril  40 mg Oral Daily  . polyethylene glycol  17 g Oral Daily   Continuous Infusions: . dextrose 5 % and 0.9% NaCl 75 mL/hr at 05/29/16 1007  . lactated ringers 10 mL/hr at 05/31/16 2104  . lactated ringers 50 mL/hr at 05/29/16 1648     LOS: 5 days     Jacquelin Hawking [facility] 06/01/2016, 2:01 PM Pager: [phone]  If 7PM-7AM, please contact night-coverage [email or web address] Password [email or web address] 06/01/2016, 2:01 PM

## 2016-06-02 DIAGNOSIS — F322 Major depressive disorder, single episode, severe without psychotic features: Secondary | ICD-10-CM | POA: Diagnosis not present

## 2016-06-02 DIAGNOSIS — I959 Hypotension, unspecified: Secondary | ICD-10-CM | POA: Diagnosis not present

## 2016-06-02 DIAGNOSIS — I4891 Unspecified atrial fibrillation: Secondary | ICD-10-CM | POA: Diagnosis not present

## 2016-06-02 DIAGNOSIS — R2681 Unsteadiness on feet: Secondary | ICD-10-CM | POA: Diagnosis not present

## 2016-06-02 DIAGNOSIS — S79911A Unspecified injury of right hip, initial encounter: Secondary | ICD-10-CM | POA: Diagnosis not present

## 2016-06-02 DIAGNOSIS — J209 Acute bronchitis, unspecified: Secondary | ICD-10-CM | POA: Diagnosis not present

## 2016-06-02 DIAGNOSIS — R001 Bradycardia, unspecified: Secondary | ICD-10-CM | POA: Diagnosis not present

## 2016-06-02 DIAGNOSIS — G8911 Acute pain due to trauma: Secondary | ICD-10-CM | POA: Diagnosis not present

## 2016-06-02 DIAGNOSIS — K4090 Unilateral inguinal hernia, without obstruction or gangrene, not specified as recurrent: Secondary | ICD-10-CM

## 2016-06-02 DIAGNOSIS — R41841 Cognitive communication deficit: Secondary | ICD-10-CM | POA: Diagnosis not present

## 2016-06-02 DIAGNOSIS — F0390 Unspecified dementia without behavioral disturbance: Secondary | ICD-10-CM | POA: Diagnosis not present

## 2016-06-02 DIAGNOSIS — D62 Acute posthemorrhagic anemia: Secondary | ICD-10-CM | POA: Diagnosis not present

## 2016-06-02 DIAGNOSIS — M6281 Muscle weakness (generalized): Secondary | ICD-10-CM | POA: Diagnosis not present

## 2016-06-02 DIAGNOSIS — S72001D Fracture of unspecified part of neck of right femur, subsequent encounter for closed fracture with routine healing: Secondary | ICD-10-CM | POA: Diagnosis not present

## 2016-06-02 DIAGNOSIS — R2689 Other abnormalities of gait and mobility: Secondary | ICD-10-CM | POA: Diagnosis not present

## 2016-06-02 DIAGNOSIS — I509 Heart failure, unspecified: Secondary | ICD-10-CM | POA: Diagnosis not present

## 2016-06-02 DIAGNOSIS — S72001A Fracture of unspecified part of neck of right femur, initial encounter for closed fracture: Secondary | ICD-10-CM | POA: Diagnosis not present

## 2016-06-02 DIAGNOSIS — I10 Essential (primary) hypertension: Secondary | ICD-10-CM | POA: Diagnosis not present

## 2016-06-02 DIAGNOSIS — R109 Unspecified abdominal pain: Secondary | ICD-10-CM | POA: Diagnosis not present

## 2016-06-02 DIAGNOSIS — Z9181 History of falling: Secondary | ICD-10-CM | POA: Diagnosis not present

## 2016-06-02 DIAGNOSIS — R740 Nonspecific elevation of levels of transaminase and lactic acid dehydrogenase [LDH]: Secondary | ICD-10-CM | POA: Diagnosis not present

## 2016-06-02 DIAGNOSIS — H109 Unspecified conjunctivitis: Secondary | ICD-10-CM | POA: Diagnosis not present

## 2016-06-02 DIAGNOSIS — Z419 Encounter for procedure for purposes other than remedying health state, unspecified: Secondary | ICD-10-CM | POA: Diagnosis not present

## 2016-06-02 DIAGNOSIS — R278 Other lack of coordination: Secondary | ICD-10-CM | POA: Diagnosis not present

## 2016-06-02 DIAGNOSIS — I480 Paroxysmal atrial fibrillation: Secondary | ICD-10-CM | POA: Diagnosis not present

## 2016-06-02 DIAGNOSIS — K5903 Drug induced constipation: Secondary | ICD-10-CM | POA: Diagnosis not present

## 2016-06-02 DIAGNOSIS — G20 Parkinson's disease: Secondary | ICD-10-CM | POA: Diagnosis not present

## 2016-06-02 DIAGNOSIS — R5383 Other fatigue: Secondary | ICD-10-CM | POA: Diagnosis not present

## 2016-06-02 DIAGNOSIS — W19XXXD Unspecified fall, subsequent encounter: Secondary | ICD-10-CM | POA: Diagnosis not present

## 2016-06-02 DIAGNOSIS — Z96641 Presence of right artificial hip joint: Secondary | ICD-10-CM | POA: Diagnosis not present

## 2016-06-02 LAB — CBC
HEMATOCRIT: 27.2 % — AB (ref 36.0–46.0)
Hemoglobin: 8.8 g/dL — ABNORMAL LOW (ref 12.0–15.0)
MCH: 30.7 pg (ref 26.0–34.0)
MCHC: 32.4 g/dL (ref 30.0–36.0)
MCV: 94.8 fL (ref 78.0–100.0)
PLATELETS: 171 10*3/uL (ref 150–400)
RBC: 2.87 MIL/uL — ABNORMAL LOW (ref 3.87–5.11)
RDW: 14.4 % (ref 11.5–15.5)
WBC: 5.4 10*3/uL (ref 4.0–10.5)

## 2016-06-02 MED ORDER — POLYETHYLENE GLYCOL 3350 17 G PO PACK: 17.0000 g | PACK | Freq: Every day | ORAL | Status: AC

## 2016-06-02 MED ORDER — MINERAL OIL RE ENEM: 1.0000 | ENEMA | Freq: Once | RECTAL | Status: DC

## 2016-06-02 NOTE — Discharge Summary (Signed)
Physician Discharge Summary  Tammy Branch ZOX:[id number] DOB: 1922/01/02 DOA: 05/27/2016  PCP: [name]., DO  Admit date: 05/27/2016 Discharge date: 06/02/2016  Admitted From: Home Disposition: SNF  Recommendations for Outpatient Follow-up:  1. Follow up with PCP in 3-5 days 2. Follow up with orthopedic surgery 3. Please obtain CBC in one week to recheck hemoglobin  Equipment/Devices: Wheelchair, hospital bed  Discharge Condition: Stable CODE STATUS: DNR/DNI Diet recommendation: Heart Healthy   Brief/Interim Summary:  Admission HPI written by Dorothea Ogle, MD   Chief Complaint: fall and right hip pain   HPI: Tammy Branch is a 83 y.o. female with known HTN, a-fib on eliquis, hypothyroidism, parkinson's, chronic diastolic CHF,  presented to [facility] ED after an episode of fall at home, tripping on the carpet and falling the right side. Now with constant right hip pain, sharp and constant, 7/10 in severity, non radiating, no specific alleviating factors, worse with movement. No fevers, chills, no chest pain, no dyspnea, no specific abd or urinary concerns. No symptoms prior to falls, no seizure like activity, no similar events in the past. Please note most information was provided from family at bedside due to patient's dementia.   ED Course: Pt is hemodynamically stable, VS notable for HR 56, BP 167/54, blood work not completed, CBC only with Plt slightly low 141 K. Imaging studies notable for right hip fracture. Ortho consulted, TRH to admit for further evaluation.     Hospital course:  Right femoral neck fracture Patient underwent right hemi-hip arthroplasty on 05/29/2016. Physical therapy evaluated her and recommended SNF for physical therapy rehab.  Elevated transaminases History of gallstones without cholecystitis. Asymptomatic. Resolved.  Essential hypertension Continued antihypertensives  Thrombocytopenia Reactive. Resolved.  Acute on chronic diastolic  heart failure Continued Lasix  Hypokalemia Asymptomatic. Resolved with supplementation.  Paroxysmal atrial fibrillation Rate controlled. On Eliquis as an outpatient. Eliquis initially held, but resumed on discharge. Continued carvedilol  Dementia Baseline.  Constipation Miralax, suppository and enema given. Continue regimen.  Post-op anemia Expected. Received 2 units of PRBC on 3/22 with adequate post-transfusion hemoglobin. Hemoglobin trended down, and Eliquis stopped, however, by the time Eliquis was held, hemoglobin had already stabilized. Eliquis restarted on discharge. Recheck hemoglobin in 3-5 days.  Left inguinal hernia Chronic. Easily reducible. Outpatient management as needed.  Discharge Diagnoses:  Principal Problem:   Closed right hip fracture, initial encounter [facility]) Active Problems:   Essential hypertension   Paroxysmal atrial fibrillation (HCC)   Elevated transaminase level   Thrombocytopenia (HCC)   Acute posthemorrhagic anemia    Discharge Instructions  Discharge Instructions    Weight bearing as tolerated    Complete by:  As directed      Allergies as of 06/02/2016      Reactions   Citalopram Hydrobromide    REACTION: hallucinations      Medication List    STOP taking these medications   clindamycin 300 MG capsule Commonly known as:  CLEOCIN   furosemide 20 MG tablet Commonly known as:  LASIX   traMADol 50 MG tablet Commonly known as:  ULTRAM     TAKE these medications   amLODipine 2.5 MG tablet Commonly known as:  NORVASC Take 1 tablet (2.5 mg total) by mouth daily.   apixaban 2.5 MG Tabs tablet Commonly known as:  ELIQUIS Take 1 tablet (2.5 mg total) by mouth 2 (two) times daily.   atorvastatin 40 MG tablet Commonly known as:  LIPITOR Take 1 tablet (40 mg total) by  mouth daily at 6 PM.   carvedilol 6.25 MG tablet Commonly known as:  COREG Take 1 tablet (6.25 mg total) by mouth 2 (two) times daily with a meal.    donepezil 5 MG tablet Commonly known as:  ARICEPT Take 1 tablet (5 mg total) by mouth at bedtime.   HYDROcodone-acetaminophen 7.5-325 MG tablet Commonly known as:  NORCO Take 1-2 tablets by mouth every 6 (six) hours as needed for moderate pain.   levothyroxine 50 MCG tablet Commonly known as:  SYNTHROID, LEVOTHROID TAKE 1 TABLET (50 MCG TOTAL) BY MOUTH DAILY BEFORE BREAKFAST.   lisinopril 40 MG tablet Commonly known as:  PRINIVIL,ZESTRIL TAKE 1 TABLET (40 MG TOTAL) BY MOUTH DAILY.   polyethylene glycol packet Commonly known as:  MIRALAX / GLYCOLAX Take 17 g by mouth daily. Start taking on:  06/03/2016       Contact information for follow-up providers    Glee Arvin, MD Follow up in 2 week(s).   Specialty:  Orthopedic Surgery Why:  For suture removal, For wound re-check Contact information: [address] [place] [address] [phone]        Kriste Basque R., DO. Schedule an appointment as soon as possible for a visit in 1 week(s).   Specialty:  Family Medicine Contact information: [address] [place] [address] [phone]            Contact information for after-discharge care    Destination    [facility] Follow up.   Specialty:  Skilled Nursing Facility Contact information: [address] [place] [address] [phone]                 Allergies  Allergen Reactions  . Citalopram Hydrobromide     REACTION: hallucinations    Consultations:  Orthopedic surgery   Procedures/Studies: Dg Chest 1 View  Result Date: 05/27/2016 CLINICAL DATA:  Right hip fracture following a fall. EXAM: CHEST 1 VIEW COMPARISON:  03/09/2016. FINDINGS: Stable enlarged cardiac silhouette. Prominent pulmonary vasculature and interstitial markings with Kerley lines. Small bilateral pleural effusions. Atheromatous aortic calcifications. Thoracic spine degenerative changes. IMPRESSION: 1.  Cardiomegaly and changes of congestive heart failure. 2. Aortic atherosclerosis. Electronically Signed   By: Beckie Salts M.D.   On: 05/27/2016 16:16   Dg Lumbar Spine 2-3 Views  Result Date: 05/27/2016 CLINICAL DATA:  Status post fall with hip fracture. EXAM: LUMBAR SPINE - 2-3 VIEW COMPARISON:  MRI of the spine 12/23/2014 FINDINGS: There is no evidence of acute lumbar spine fracture. There is compression fracture with severe anterior compression deformity of L1 vertebral body, which appears stable from MRI dated 12/23/2014, accounting for cross modality comparison. There is a stable retropulsion into the spinal canal of the L1 vertebral body. There is an exaggerated kyphosis centered at this level. Mild multilevel osteoarthritic changes is seen. There is generalized osteopenia. Calcific atherosclerotic disease of the aorta noted. IMPRESSION: No evidence of new lumbosacral spine fractures. Stable compression fracture with severe anterior wedge deformity and retropulsion of L1 vertebral body. Generalized osteopenia. Electronically Signed   By: Ted Mcalpine M.D.   On: 05/27/2016 18:20   Pelvis Portable  Result Date: 05/29/2016 CLINICAL DATA:  Status post right hip hemiarthroplasty. Initial encounter. EXAM: PORTABLE PELVIS 1-2 VIEWS COMPARISON:  Right hip radiographs performed 05/27/2016 FINDINGS: The patient's right hip hemiarthroplasty is grossly unremarkable in appearance, without evidence of loosening. Overlying postoperative change and soft tissue air are seen. There is no evidence of fracture or  dislocation. The left hip joint is grossly unremarkable in appearance. IMPRESSION: Right hip hemiarthroplasty is grossly unremarkable in appearance, without evidence of loosening. No evidence of fracture. Electronically Signed   By: Roanna Raider M.D.   On: 05/29/2016 21:21   Dg Abd Portable 1v  Result Date: 06/01/2016 CLINICAL DATA:  Constipation EXAM: PORTABLE ABDOMEN - 1 VIEW COMPARISON:  None.  FINDINGS: There is normal small bowel gas pattern. Moderate stool noted within right colon. Moderate gas noted transverse colon descending colon and sigmoid colon. Moderate stool noted within rectum. The rectum measures about 6 cm in diameter. Mild fecal impaction cannot be excluded. IMPRESSION: Normal small bowel gas pattern. Colonic stool and gas as described above. Moderate stool noted within rectum. The rectum measures 6 cm in diameter suspicious for mild fecal impaction. Electronically Signed   By: Natasha Mead M.D.   On: 06/01/2016 12:52   Dg C-arm 61-120 Min  Result Date: 05/29/2016 CLINICAL DATA:  Hemiarthroplasty EXAM: DG C-ARM 61-120 MIN; OPERATIVE RIGHT HIP WITH PELVIS COMPARISON:  05/27/2016 FINDINGS: Total fluoroscopy time was 16 seconds. Two low resolution intraoperative spot films of the right pelvis are submitted. The images demonstrate right hip replacement. Normal hardware alignment. IMPRESSION: Intra operative fluoroscopy provided during right hip replacement. Electronically Signed   By: Jasmine Pang M.D.   On: 05/29/2016 20:38   Dg Hip Operative Unilat W Or W/o Pelvis Right  Result Date: 05/29/2016 CLINICAL DATA:  Hemiarthroplasty EXAM: DG C-ARM 61-120 MIN; OPERATIVE RIGHT HIP WITH PELVIS COMPARISON:  05/27/2016 FINDINGS: Total fluoroscopy time was 16 seconds. Two low resolution intraoperative spot films of the right pelvis are submitted. The images demonstrate right hip replacement. Normal hardware alignment. IMPRESSION: Intra operative fluoroscopy provided during right hip replacement. Electronically Signed   By: Jasmine Pang M.D.   On: 05/29/2016 20:38   Dg Hip Unilat  With Pelvis 2-3 Views Right  Result Date: 05/27/2016 CLINICAL DATA:  Right hip pain following a fall on carpet at home. EXAM: DG HIP (WITH OR WITHOUT PELVIS) 2-3V RIGHT COMPARISON:  10/15/2014. FINDINGS: Right femoral neck fracture with varus angulation and mild proximal displacement of the distal fragment. Diffuse  osteopenia. Mild lower lumbar spine levoconvex scoliosis and degenerative changes. IMPRESSION: Right femoral neck fracture, as described above. Electronically Signed   By: Beckie Salts M.D.   On: 05/27/2016 16:15   US Abdomen Limited Ruq  Result Date: 05/28/2016 CLINICAL DATA:  83 year old female with elevated transaminases. Initial encounter. EXAM: US ABDOMEN LIMITED - RIGHT UPPER QUADRANT COMPARISON:  04/08/2003 CT. FINDINGS: Gallbladder: Gallstones measuring up to 2.7 cm. No obvious gallbladder wall thickening. Patient was not tender over this region during scanning per ultrasound technologist. Evaluation somewhat limited as patient was not able to turn. Common bile duct: Diameter: 6 mm. Liver: Multiple liver cysts measuring up to 4.9 cm. Right-sided pleural effusion for IMPRESSION: Gallstones. No sonographic evidence of cholecystitis (evaluation slightly limited). Multiple liver cysts. Right-sided pleural effusion. Electronically Signed   By: Lacy Duverney M.D.   On: 05/28/2016 19:39       Subjective: Patient does not verbalize much which is her baseline. Does not appear to be in pain.  Discharge Exam: Vitals:   06/01/16 2130 06/02/16 0900  BP: (!) 118/51 (!) 163/46  Pulse: 61 61  Resp:  18  Temp: 98.3 F (36.8 C) 98.1 F (36.7 C)   Vitals:   06/01/16 1000 06/01/16 1415 06/01/16 2130 06/02/16 0900  BP: (!) 152/46 (!) 115/30 (!) 118/51 (!) 163/46  Pulse:  (!) 53 61 61  Resp:  16  18  Temp: 99.4 F (37.4 C) 97.6 F (36.4 C) 98.3 F (36.8 C) 98.1 F (36.7 C)  TempSrc: Oral  Oral Oral  SpO2: 91% 92% 94% 95%  Weight:      Height:        General: Pt is alert, awake, not in acute distress Cardiovascular: RRR, S1/S2 +, no rubs, no gallops Respiratory: CTA bilaterally, no wheezing, no rhonchi Abdominal: Soft, NT, ND, bowel sounds +. Large reducible left inguinal hernia Extremities: no edema, no cyanosis    The results of significant diagnostics from this hospitalization  (including imaging, microbiology, ancillary and laboratory) are listed below for reference.     Microbiology: Recent Results (from the past 240 hour(s))  Urine culture     Status: None   Collection Time: 05/27/16 11:47 PM  Result Value Ref Range Status   Specimen Description URINE, RANDOM  Final   Special Requests NONE  Final   Culture NO GROWTH  Final   Report Status 05/29/2016 FINAL  Final  MRSA PCR Screening     Status: None   Collection Time: 05/27/16 11:59 PM  Result Value Ref Range Status   MRSA by PCR NEGATIVE NEGATIVE Final    Comment:        The GeneXpert MRSA Assay (FDA approved for NASAL specimens only), is one component of a comprehensive MRSA colonization surveillance program. It is not intended to diagnose MRSA infection nor to guide or monitor treatment for MRSA infections.      Labs: BNP (last 3 results)  Recent Labs  03/09/16 0718  BNP 1,348.6*   Basic Metabolic Panel:  Recent Labs Lab 05/27/16 1628 05/28/16 0518 05/29/16 0524 05/30/16 0532 05/31/16 0640  NA 132* 139 136 137 135  K 5.6* 3.1* 3.3* 5.3* 4.5  CL 100* 101 99* 104 103  CO2 27 30 29 27 26   GLUCOSE 135* 100* 83 123* 99  BUN 17 14 17 20  25*  CREATININE 0.92 0.88 1.05* 1.14* 1.11*  CALCIUM 8.5* 8.4* 8.1* 7.7* 7.7*   Liver Function Tests:  Recent Labs Lab 05/27/16 1628 05/29/16 0524  AST 108* 40  ALT 60* 33  ALKPHOS 162* 94  BILITOT 1.5* 1.6*  PROT 5.5* 4.7*  ALBUMIN 3.3* 2.5*   No results for input(s): LIPASE, AMYLASE in the last 168 hours. No results for input(s): AMMONIA in the last 168 hours. CBC:  Recent Labs Lab 05/27/16 1628  05/30/16 0532 05/31/16 0640 05/31/16 2135 06/01/16 0239 06/01/16 1131 06/02/16 1005  WBC 8.9  < > 7.0 6.9  --  6.5 5.4 5.4  NEUTROABS 7.6  --   --   --   --   --   --   --   HGB 13.3  < > 8.8* 6.4* 9.1* 8.6* 8.6* 8.8*  HCT 40.6  < > 27.0* 19.2* 27.2* 25.7* 25.7* 27.2*  MCV 96.2  < > 95.1 95.0  --  92.4 93.5 94.8  PLT 141*  < >  135* 124*  --  133* 139* 171  < > = values in this interval not displayed. Cardiac Enzymes: No results for input(s): CKTOTAL, CKMB, CKMBINDEX, TROPONINI in the last 168 hours. BNP: Invalid input(s): POCBNP CBG: No results for input(s): GLUCAP in the last 168 hours. D-Dimer No results for input(s): DDIMER in the last 72 hours. Hgb A1c No results for input(s): HGBA1C in the last 72 hours. Lipid Profile No results for input(s): CHOL, HDL,  LDLCALC, TRIG, CHOLHDL, LDLDIRECT in the last 72 hours. Thyroid function studies No results for input(s): TSH, T4TOTAL, T3FREE, THYROIDAB in the last 72 hours.  Invalid input(s): FREET3 Anemia work up No results for input(s): VITAMINB12, FOLATE, FERRITIN, TIBC, IRON, RETICCTPCT in the last 72 hours. Urinalysis    Component Value Date/Time   COLORURINE STRAW (A) 05/27/2016 2347   APPEARANCEUR CLEAR 05/27/2016 2347   LABSPEC 1.005 05/27/2016 2347   PHURINE 8.0 05/27/2016 2347   GLUCOSEU NEGATIVE 05/27/2016 2347   HGBUR SMALL (A) 05/27/2016 2347   BILIRUBINUR NEGATIVE 05/27/2016 2347   BILIRUBINUR negative 12/06/2014 1507   KETONESUR NEGATIVE 05/27/2016 2347   PROTEINUR 30 (A) 05/27/2016 2347   UROBILINOGEN 2.0 (H) 12/23/2014 1942   NITRITE NEGATIVE 05/27/2016 2347   LEUKOCYTESUR NEGATIVE 05/27/2016 2347   Sepsis Labs Invalid input(s): PROCALCITONIN,  WBC,  LACTICIDVEN Microbiology Recent Results (from the past 240 hour(s))  Urine culture     Status: None   Collection Time: 05/27/16 11:47 PM  Result Value Ref Range Status   Specimen Description URINE, RANDOM  Final   Special Requests NONE  Final   Culture NO GROWTH  Final   Report Status 05/29/2016 FINAL  Final  MRSA PCR Screening     Status: None   Collection Time: 05/27/16 11:59 PM  Result Value Ref Range Status   MRSA by PCR NEGATIVE NEGATIVE Final    Comment:        The GeneXpert MRSA Assay (FDA approved for NASAL specimens only), is one component of a comprehensive MRSA  colonization surveillance program. It is not intended to diagnose MRSA infection nor to guide or monitor treatment for MRSA infections.      Time coordinating discharge: Over 30 minutes  SIGNED:   Jacquelin Hawking, MD [facility] 06/02/2016, 10:59 AM Pager [phone]  If 7PM-7AM, please contact night-coverage [email or web address] Password [email or web address]

## 2016-06-02 NOTE — Progress Notes (Signed)
Clinical Social Worker facilitated patient discharge including contacting patient family and facility to confirm patient discharge plans.  Clinical information faxed to facility and family agreeable with plan.  CSW arranged ambulance transport via [facility] to [facility] .  RN Molli Hazard to call [phone] report prior to discharge.  Clinical Social Worker will sign off for now as social work intervention is no longer needed. Please consult [place] again if new need arises.  Marrianne Mood, MSW, [employer] [phone]

## 2016-06-03 DIAGNOSIS — S72001A Fracture of unspecified part of neck of right femur, initial encounter for closed fracture: Secondary | ICD-10-CM | POA: Diagnosis not present

## 2016-06-03 DIAGNOSIS — F322 Major depressive disorder, single episode, severe without psychotic features: Secondary | ICD-10-CM | POA: Diagnosis not present

## 2016-06-03 DIAGNOSIS — I509 Heart failure, unspecified: Secondary | ICD-10-CM | POA: Diagnosis not present

## 2016-06-03 DIAGNOSIS — I4891 Unspecified atrial fibrillation: Secondary | ICD-10-CM | POA: Diagnosis not present

## 2016-06-03 DIAGNOSIS — F0390 Unspecified dementia without behavioral disturbance: Secondary | ICD-10-CM | POA: Diagnosis not present

## 2016-06-12 DIAGNOSIS — R5383 Other fatigue: Secondary | ICD-10-CM | POA: Diagnosis not present

## 2016-06-12 DIAGNOSIS — R001 Bradycardia, unspecified: Secondary | ICD-10-CM | POA: Diagnosis not present

## 2016-06-12 DIAGNOSIS — I959 Hypotension, unspecified: Secondary | ICD-10-CM | POA: Diagnosis not present

## 2016-06-18 ENCOUNTER — Ambulatory Visit (INDEPENDENT_AMBULATORY_CARE_PROVIDER_SITE_OTHER): Payer: No Typology Code available for payment source

## 2016-06-18 ENCOUNTER — Ambulatory Visit (INDEPENDENT_AMBULATORY_CARE_PROVIDER_SITE_OTHER): Payer: 59 | Admitting: Orthopaedic Surgery

## 2016-06-18 ENCOUNTER — Encounter (INDEPENDENT_AMBULATORY_CARE_PROVIDER_SITE_OTHER): Payer: Self-pay | Admitting: Orthopaedic Surgery

## 2016-06-18 DIAGNOSIS — S72001A Fracture of unspecified part of neck of right femur, initial encounter for closed fracture: Secondary | ICD-10-CM | POA: Diagnosis not present

## 2016-06-18 NOTE — Progress Notes (Signed)
The patient is 3 weeks status post right hip hemiarthroplasty. She is doing well per the family. She is nonverbal and in a wheelchair. Her incision has healed without any signs of infection. She has painless range of motion of the hip. X-ray show stable alignment of the prosthesis. At this point continue with physical therapy if possible given her dementia. I'll like to see her back one time in about 6 weeks. No x-rays needed unless she is having problems. Anticipate releasing her at that time. May discontinue Lovenox and begin aspirin daily for 4 weeks.

## 2016-06-21 ENCOUNTER — Telehealth: Payer: Self-pay | Admitting: Family Medicine

## 2016-06-21 NOTE — Telephone Encounter (Signed)
Son in law calling to check the status of pt's FMLA paperwork. Dropped off 3/27 and he needs to return to work on Monday. Please advise.  Needs to pick up.

## 2016-06-22 NOTE — Telephone Encounter (Signed)
Tammy Branch is aware.

## 2016-06-22 NOTE — Telephone Encounter (Signed)
Tammy Branch notified to pick up at the front desk.  Copy sent to scan.

## 2016-06-22 NOTE — Telephone Encounter (Signed)
On Dr. Elmyra Ricks desk

## 2016-06-22 NOTE — Telephone Encounter (Signed)
Roe Coombs would like to pick up today to take to work on Monday. Please advise and thank you!

## 2016-06-23 DIAGNOSIS — J209 Acute bronchitis, unspecified: Secondary | ICD-10-CM | POA: Diagnosis not present

## 2016-06-23 DIAGNOSIS — H109 Unspecified conjunctivitis: Secondary | ICD-10-CM | POA: Diagnosis not present

## 2016-07-26 DIAGNOSIS — F0390 Unspecified dementia without behavioral disturbance: Secondary | ICD-10-CM | POA: Diagnosis not present

## 2016-07-26 DIAGNOSIS — J209 Acute bronchitis, unspecified: Secondary | ICD-10-CM | POA: Diagnosis not present

## 2016-07-26 DIAGNOSIS — R001 Bradycardia, unspecified: Secondary | ICD-10-CM | POA: Diagnosis not present

## 2016-08-02 ENCOUNTER — Ambulatory Visit (INDEPENDENT_AMBULATORY_CARE_PROVIDER_SITE_OTHER): Payer: Medicare Other | Admitting: Orthopaedic Surgery

## 2016-08-02 ENCOUNTER — Encounter (INDEPENDENT_AMBULATORY_CARE_PROVIDER_SITE_OTHER): Payer: Self-pay | Admitting: Orthopaedic Surgery

## 2016-08-02 DIAGNOSIS — S72001A Fracture of unspecified part of neck of right femur, initial encounter for closed fracture: Secondary | ICD-10-CM

## 2016-08-02 NOTE — Progress Notes (Signed)
The patient is 65 days status post right hip hemiarthroplasty. [facility]. She is doing physical therapy and doing well. She denies any pain. She has equal leg lengths and painless range of motion of the hip. The surgical scar is fully healed. From my standpoint patient has reached MMI. She is doing well. Follow up with me as needed.

## 2016-11-15 DIAGNOSIS — G20 Parkinson's disease: Secondary | ICD-10-CM | POA: Diagnosis not present

## 2016-11-15 DIAGNOSIS — M6281 Muscle weakness (generalized): Secondary | ICD-10-CM | POA: Diagnosis not present

## 2016-11-15 DIAGNOSIS — M25561 Pain in right knee: Secondary | ICD-10-CM | POA: Diagnosis not present

## 2016-11-15 DIAGNOSIS — F0390 Unspecified dementia without behavioral disturbance: Secondary | ICD-10-CM | POA: Diagnosis not present

## 2016-11-20 DIAGNOSIS — G20 Parkinson's disease: Secondary | ICD-10-CM | POA: Diagnosis not present

## 2016-11-20 DIAGNOSIS — F0390 Unspecified dementia without behavioral disturbance: Secondary | ICD-10-CM | POA: Diagnosis not present

## 2016-11-20 DIAGNOSIS — M25561 Pain in right knee: Secondary | ICD-10-CM | POA: Diagnosis not present

## 2016-11-20 DIAGNOSIS — M6281 Muscle weakness (generalized): Secondary | ICD-10-CM | POA: Diagnosis not present

## 2016-11-21 DIAGNOSIS — M25561 Pain in right knee: Secondary | ICD-10-CM | POA: Diagnosis not present

## 2016-11-21 DIAGNOSIS — F0390 Unspecified dementia without behavioral disturbance: Secondary | ICD-10-CM | POA: Diagnosis not present

## 2016-11-21 DIAGNOSIS — G20 Parkinson's disease: Secondary | ICD-10-CM | POA: Diagnosis not present

## 2016-11-21 DIAGNOSIS — M6281 Muscle weakness (generalized): Secondary | ICD-10-CM | POA: Diagnosis not present

## 2016-11-25 DIAGNOSIS — I509 Heart failure, unspecified: Secondary | ICD-10-CM | POA: Diagnosis not present

## 2016-11-25 DIAGNOSIS — F322 Major depressive disorder, single episode, severe without psychotic features: Secondary | ICD-10-CM | POA: Diagnosis not present

## 2016-11-25 DIAGNOSIS — I10 Essential (primary) hypertension: Secondary | ICD-10-CM | POA: Diagnosis not present

## 2016-11-25 DIAGNOSIS — F0390 Unspecified dementia without behavioral disturbance: Secondary | ICD-10-CM | POA: Diagnosis not present

## 2016-11-25 DIAGNOSIS — I4891 Unspecified atrial fibrillation: Secondary | ICD-10-CM | POA: Diagnosis not present

## 2016-11-26 DIAGNOSIS — M6281 Muscle weakness (generalized): Secondary | ICD-10-CM | POA: Diagnosis not present

## 2016-11-26 DIAGNOSIS — E539 Vitamin B deficiency, unspecified: Secondary | ICD-10-CM | POA: Diagnosis not present

## 2016-11-26 DIAGNOSIS — M25561 Pain in right knee: Secondary | ICD-10-CM | POA: Diagnosis not present

## 2016-11-26 DIAGNOSIS — Z79899 Other long term (current) drug therapy: Secondary | ICD-10-CM | POA: Diagnosis not present

## 2016-11-26 DIAGNOSIS — F0390 Unspecified dementia without behavioral disturbance: Secondary | ICD-10-CM | POA: Diagnosis not present

## 2016-11-26 DIAGNOSIS — E039 Hypothyroidism, unspecified: Secondary | ICD-10-CM | POA: Diagnosis not present

## 2016-11-26 DIAGNOSIS — G20 Parkinson's disease: Secondary | ICD-10-CM | POA: Diagnosis not present

## 2016-11-27 DIAGNOSIS — M25561 Pain in right knee: Secondary | ICD-10-CM | POA: Diagnosis not present

## 2016-11-27 DIAGNOSIS — F0390 Unspecified dementia without behavioral disturbance: Secondary | ICD-10-CM | POA: Diagnosis not present

## 2016-11-27 DIAGNOSIS — G20 Parkinson's disease: Secondary | ICD-10-CM | POA: Diagnosis not present

## 2016-11-27 DIAGNOSIS — M6281 Muscle weakness (generalized): Secondary | ICD-10-CM | POA: Diagnosis not present

## 2016-11-28 DIAGNOSIS — F0390 Unspecified dementia without behavioral disturbance: Secondary | ICD-10-CM | POA: Diagnosis not present

## 2016-11-28 DIAGNOSIS — M25561 Pain in right knee: Secondary | ICD-10-CM | POA: Diagnosis not present

## 2016-11-28 DIAGNOSIS — M6281 Muscle weakness (generalized): Secondary | ICD-10-CM | POA: Diagnosis not present

## 2016-11-28 DIAGNOSIS — G20 Parkinson's disease: Secondary | ICD-10-CM | POA: Diagnosis not present

## 2016-11-29 DIAGNOSIS — F0390 Unspecified dementia without behavioral disturbance: Secondary | ICD-10-CM | POA: Diagnosis not present

## 2016-11-29 DIAGNOSIS — G20 Parkinson's disease: Secondary | ICD-10-CM | POA: Diagnosis not present

## 2016-11-29 DIAGNOSIS — M25561 Pain in right knee: Secondary | ICD-10-CM | POA: Diagnosis not present

## 2016-11-29 DIAGNOSIS — M6281 Muscle weakness (generalized): Secondary | ICD-10-CM | POA: Diagnosis not present

## 2016-11-30 DIAGNOSIS — G20 Parkinson's disease: Secondary | ICD-10-CM | POA: Diagnosis not present

## 2016-11-30 DIAGNOSIS — M25561 Pain in right knee: Secondary | ICD-10-CM | POA: Diagnosis not present

## 2016-11-30 DIAGNOSIS — F0390 Unspecified dementia without behavioral disturbance: Secondary | ICD-10-CM | POA: Diagnosis not present

## 2016-11-30 DIAGNOSIS — M6281 Muscle weakness (generalized): Secondary | ICD-10-CM | POA: Diagnosis not present

## 2016-12-04 DIAGNOSIS — F0390 Unspecified dementia without behavioral disturbance: Secondary | ICD-10-CM | POA: Diagnosis not present

## 2016-12-04 DIAGNOSIS — G20 Parkinson's disease: Secondary | ICD-10-CM | POA: Diagnosis not present

## 2016-12-04 DIAGNOSIS — M25561 Pain in right knee: Secondary | ICD-10-CM | POA: Diagnosis not present

## 2016-12-04 DIAGNOSIS — M6281 Muscle weakness (generalized): Secondary | ICD-10-CM | POA: Diagnosis not present

## 2016-12-06 DIAGNOSIS — M25561 Pain in right knee: Secondary | ICD-10-CM | POA: Diagnosis not present

## 2016-12-06 DIAGNOSIS — M6281 Muscle weakness (generalized): Secondary | ICD-10-CM | POA: Diagnosis not present

## 2016-12-06 DIAGNOSIS — G20 Parkinson's disease: Secondary | ICD-10-CM | POA: Diagnosis not present

## 2016-12-06 DIAGNOSIS — F0390 Unspecified dementia without behavioral disturbance: Secondary | ICD-10-CM | POA: Diagnosis not present

## 2016-12-07 DIAGNOSIS — M6281 Muscle weakness (generalized): Secondary | ICD-10-CM | POA: Diagnosis not present

## 2016-12-07 DIAGNOSIS — G20 Parkinson's disease: Secondary | ICD-10-CM | POA: Diagnosis not present

## 2016-12-07 DIAGNOSIS — F0390 Unspecified dementia without behavioral disturbance: Secondary | ICD-10-CM | POA: Diagnosis not present

## 2016-12-07 DIAGNOSIS — M25561 Pain in right knee: Secondary | ICD-10-CM | POA: Diagnosis not present

## 2016-12-11 DIAGNOSIS — M25561 Pain in right knee: Secondary | ICD-10-CM | POA: Diagnosis not present

## 2016-12-11 DIAGNOSIS — M6281 Muscle weakness (generalized): Secondary | ICD-10-CM | POA: Diagnosis not present

## 2016-12-11 DIAGNOSIS — G20 Parkinson's disease: Secondary | ICD-10-CM | POA: Diagnosis not present

## 2016-12-11 DIAGNOSIS — F0390 Unspecified dementia without behavioral disturbance: Secondary | ICD-10-CM | POA: Diagnosis not present

## 2017-02-22 DIAGNOSIS — D649 Anemia, unspecified: Secondary | ICD-10-CM | POA: Diagnosis not present

## 2017-02-22 DIAGNOSIS — F322 Major depressive disorder, single episode, severe without psychotic features: Secondary | ICD-10-CM | POA: Diagnosis not present

## 2017-02-22 DIAGNOSIS — I4891 Unspecified atrial fibrillation: Secondary | ICD-10-CM | POA: Diagnosis not present

## 2017-02-22 DIAGNOSIS — F0390 Unspecified dementia without behavioral disturbance: Secondary | ICD-10-CM | POA: Diagnosis not present

## 2017-04-10 DIAGNOSIS — I739 Peripheral vascular disease, unspecified: Secondary | ICD-10-CM | POA: Diagnosis not present

## 2017-04-10 DIAGNOSIS — B351 Tinea unguium: Secondary | ICD-10-CM | POA: Diagnosis not present

## 2017-04-17 DIAGNOSIS — D649 Anemia, unspecified: Secondary | ICD-10-CM | POA: Diagnosis not present

## 2017-04-17 DIAGNOSIS — N390 Urinary tract infection, site not specified: Secondary | ICD-10-CM | POA: Diagnosis not present

## 2017-04-17 DIAGNOSIS — Z79899 Other long term (current) drug therapy: Secondary | ICD-10-CM | POA: Diagnosis not present

## 2017-04-18 DIAGNOSIS — N390 Urinary tract infection, site not specified: Secondary | ICD-10-CM | POA: Diagnosis not present

## 2017-04-18 DIAGNOSIS — R319 Hematuria, unspecified: Secondary | ICD-10-CM | POA: Diagnosis not present

## 2017-04-18 DIAGNOSIS — R451 Restlessness and agitation: Secondary | ICD-10-CM | POA: Diagnosis not present

## 2017-04-18 DIAGNOSIS — Z79899 Other long term (current) drug therapy: Secondary | ICD-10-CM | POA: Diagnosis not present

## 2017-04-18 DIAGNOSIS — R8290 Unspecified abnormal findings in urine: Secondary | ICD-10-CM | POA: Diagnosis not present

## 2017-04-21 DIAGNOSIS — R451 Restlessness and agitation: Secondary | ICD-10-CM | POA: Diagnosis not present

## 2017-04-21 DIAGNOSIS — F0390 Unspecified dementia without behavioral disturbance: Secondary | ICD-10-CM | POA: Diagnosis not present

## 2017-04-21 DIAGNOSIS — F22 Delusional disorders: Secondary | ICD-10-CM | POA: Diagnosis not present

## 2017-05-02 DIAGNOSIS — F0390 Unspecified dementia without behavioral disturbance: Secondary | ICD-10-CM | POA: Diagnosis not present

## 2017-05-02 DIAGNOSIS — G20 Parkinson's disease: Secondary | ICD-10-CM | POA: Diagnosis not present

## 2017-05-02 DIAGNOSIS — R1312 Dysphagia, oropharyngeal phase: Secondary | ICD-10-CM | POA: Diagnosis not present

## 2017-05-03 DIAGNOSIS — G20 Parkinson's disease: Secondary | ICD-10-CM | POA: Diagnosis not present

## 2017-05-03 DIAGNOSIS — F0390 Unspecified dementia without behavioral disturbance: Secondary | ICD-10-CM | POA: Diagnosis not present

## 2017-05-03 DIAGNOSIS — R1312 Dysphagia, oropharyngeal phase: Secondary | ICD-10-CM | POA: Diagnosis not present

## 2017-05-04 DIAGNOSIS — R451 Restlessness and agitation: Secondary | ICD-10-CM | POA: Diagnosis not present

## 2017-05-04 DIAGNOSIS — F0390 Unspecified dementia without behavioral disturbance: Secondary | ICD-10-CM | POA: Diagnosis not present

## 2017-05-06 DIAGNOSIS — R1312 Dysphagia, oropharyngeal phase: Secondary | ICD-10-CM | POA: Diagnosis not present

## 2017-05-06 DIAGNOSIS — G20 Parkinson's disease: Secondary | ICD-10-CM | POA: Diagnosis not present

## 2017-05-06 DIAGNOSIS — F0390 Unspecified dementia without behavioral disturbance: Secondary | ICD-10-CM | POA: Diagnosis not present

## 2017-05-08 DIAGNOSIS — R1312 Dysphagia, oropharyngeal phase: Secondary | ICD-10-CM | POA: Diagnosis not present

## 2017-05-08 DIAGNOSIS — F0390 Unspecified dementia without behavioral disturbance: Secondary | ICD-10-CM | POA: Diagnosis not present

## 2017-05-08 DIAGNOSIS — G20 Parkinson's disease: Secondary | ICD-10-CM | POA: Diagnosis not present

## 2017-05-10 DIAGNOSIS — R1312 Dysphagia, oropharyngeal phase: Secondary | ICD-10-CM | POA: Diagnosis not present

## 2017-05-10 DIAGNOSIS — F0390 Unspecified dementia without behavioral disturbance: Secondary | ICD-10-CM | POA: Diagnosis not present

## 2017-05-15 DIAGNOSIS — F0390 Unspecified dementia without behavioral disturbance: Secondary | ICD-10-CM | POA: Diagnosis not present

## 2017-05-15 DIAGNOSIS — R1312 Dysphagia, oropharyngeal phase: Secondary | ICD-10-CM | POA: Diagnosis not present

## 2017-05-16 DIAGNOSIS — R1312 Dysphagia, oropharyngeal phase: Secondary | ICD-10-CM | POA: Diagnosis not present

## 2017-05-16 DIAGNOSIS — F0390 Unspecified dementia without behavioral disturbance: Secondary | ICD-10-CM | POA: Diagnosis not present

## 2017-05-17 DIAGNOSIS — F0390 Unspecified dementia without behavioral disturbance: Secondary | ICD-10-CM | POA: Diagnosis not present

## 2017-05-17 DIAGNOSIS — R1312 Dysphagia, oropharyngeal phase: Secondary | ICD-10-CM | POA: Diagnosis not present

## 2017-05-20 DIAGNOSIS — F0390 Unspecified dementia without behavioral disturbance: Secondary | ICD-10-CM | POA: Diagnosis not present

## 2017-05-20 DIAGNOSIS — R1312 Dysphagia, oropharyngeal phase: Secondary | ICD-10-CM | POA: Diagnosis not present

## 2017-05-21 DIAGNOSIS — R1312 Dysphagia, oropharyngeal phase: Secondary | ICD-10-CM | POA: Diagnosis not present

## 2017-05-21 DIAGNOSIS — F0390 Unspecified dementia without behavioral disturbance: Secondary | ICD-10-CM | POA: Diagnosis not present

## 2017-05-22 DIAGNOSIS — R1312 Dysphagia, oropharyngeal phase: Secondary | ICD-10-CM | POA: Diagnosis not present

## 2017-05-22 DIAGNOSIS — F0390 Unspecified dementia without behavioral disturbance: Secondary | ICD-10-CM | POA: Diagnosis not present

## 2017-06-19 DIAGNOSIS — I739 Peripheral vascular disease, unspecified: Secondary | ICD-10-CM | POA: Diagnosis not present

## 2017-06-19 DIAGNOSIS — B351 Tinea unguium: Secondary | ICD-10-CM | POA: Diagnosis not present

## 2017-06-23 DIAGNOSIS — R946 Abnormal results of thyroid function studies: Secondary | ICD-10-CM | POA: Diagnosis not present

## 2017-06-23 DIAGNOSIS — I4891 Unspecified atrial fibrillation: Secondary | ICD-10-CM | POA: Diagnosis not present

## 2017-06-23 DIAGNOSIS — F322 Major depressive disorder, single episode, severe without psychotic features: Secondary | ICD-10-CM | POA: Diagnosis not present

## 2017-06-23 DIAGNOSIS — F0390 Unspecified dementia without behavioral disturbance: Secondary | ICD-10-CM | POA: Diagnosis not present

## 2017-06-23 DIAGNOSIS — I509 Heart failure, unspecified: Secondary | ICD-10-CM | POA: Diagnosis not present

## 2017-06-24 DIAGNOSIS — Z79899 Other long term (current) drug therapy: Secondary | ICD-10-CM | POA: Diagnosis not present

## 2017-06-24 DIAGNOSIS — D649 Anemia, unspecified: Secondary | ICD-10-CM | POA: Diagnosis not present

## 2017-06-24 DIAGNOSIS — E039 Hypothyroidism, unspecified: Secondary | ICD-10-CM | POA: Diagnosis not present

## 2017-06-25 DIAGNOSIS — E039 Hypothyroidism, unspecified: Secondary | ICD-10-CM | POA: Diagnosis not present

## 2017-07-08 DIAGNOSIS — I10 Essential (primary) hypertension: Secondary | ICD-10-CM | POA: Diagnosis not present

## 2017-07-08 DIAGNOSIS — G20 Parkinson's disease: Secondary | ICD-10-CM | POA: Diagnosis not present

## 2017-07-08 DIAGNOSIS — Z79899 Other long term (current) drug therapy: Secondary | ICD-10-CM | POA: Diagnosis not present

## 2017-07-08 DIAGNOSIS — I480 Paroxysmal atrial fibrillation: Secondary | ICD-10-CM | POA: Diagnosis not present

## 2017-07-08 DIAGNOSIS — E039 Hypothyroidism, unspecified: Secondary | ICD-10-CM | POA: Diagnosis not present

## 2017-07-14 DIAGNOSIS — F322 Major depressive disorder, single episode, severe without psychotic features: Secondary | ICD-10-CM | POA: Diagnosis not present

## 2017-07-14 DIAGNOSIS — F0390 Unspecified dementia without behavioral disturbance: Secondary | ICD-10-CM | POA: Diagnosis not present

## 2017-07-14 DIAGNOSIS — H01009 Unspecified blepharitis unspecified eye, unspecified eyelid: Secondary | ICD-10-CM | POA: Diagnosis not present

## 2017-09-18 DIAGNOSIS — I739 Peripheral vascular disease, unspecified: Secondary | ICD-10-CM | POA: Diagnosis not present

## 2017-09-18 DIAGNOSIS — B351 Tinea unguium: Secondary | ICD-10-CM | POA: Diagnosis not present

## 2017-10-27 DIAGNOSIS — E785 Hyperlipidemia, unspecified: Secondary | ICD-10-CM | POA: Diagnosis not present

## 2017-10-27 DIAGNOSIS — I509 Heart failure, unspecified: Secondary | ICD-10-CM | POA: Diagnosis not present

## 2017-10-27 DIAGNOSIS — F0390 Unspecified dementia without behavioral disturbance: Secondary | ICD-10-CM | POA: Diagnosis not present

## 2017-10-27 DIAGNOSIS — F322 Major depressive disorder, single episode, severe without psychotic features: Secondary | ICD-10-CM | POA: Diagnosis not present

## 2017-10-27 DIAGNOSIS — I10 Essential (primary) hypertension: Secondary | ICD-10-CM | POA: Diagnosis not present

## 2017-10-28 DIAGNOSIS — E039 Hypothyroidism, unspecified: Secondary | ICD-10-CM | POA: Diagnosis not present

## 2017-10-28 DIAGNOSIS — Z79899 Other long term (current) drug therapy: Secondary | ICD-10-CM | POA: Diagnosis not present

## 2017-10-28 DIAGNOSIS — E785 Hyperlipidemia, unspecified: Secondary | ICD-10-CM | POA: Diagnosis not present

## 2017-11-14 DIAGNOSIS — H04123 Dry eye syndrome of bilateral lacrimal glands: Secondary | ICD-10-CM | POA: Diagnosis not present

## 2017-11-14 DIAGNOSIS — H2513 Age-related nuclear cataract, bilateral: Secondary | ICD-10-CM | POA: Diagnosis not present

## 2017-11-15 IMAGING — CR DG CHEST 2V
2 series · 2 of 2 positions shown · non-contrast
Comparison: 11/29/2012.

CLINICAL DATA: Chest pain and BILATERAL arm pain.

EXAM:
CHEST  2 VIEW

[x chest ap]
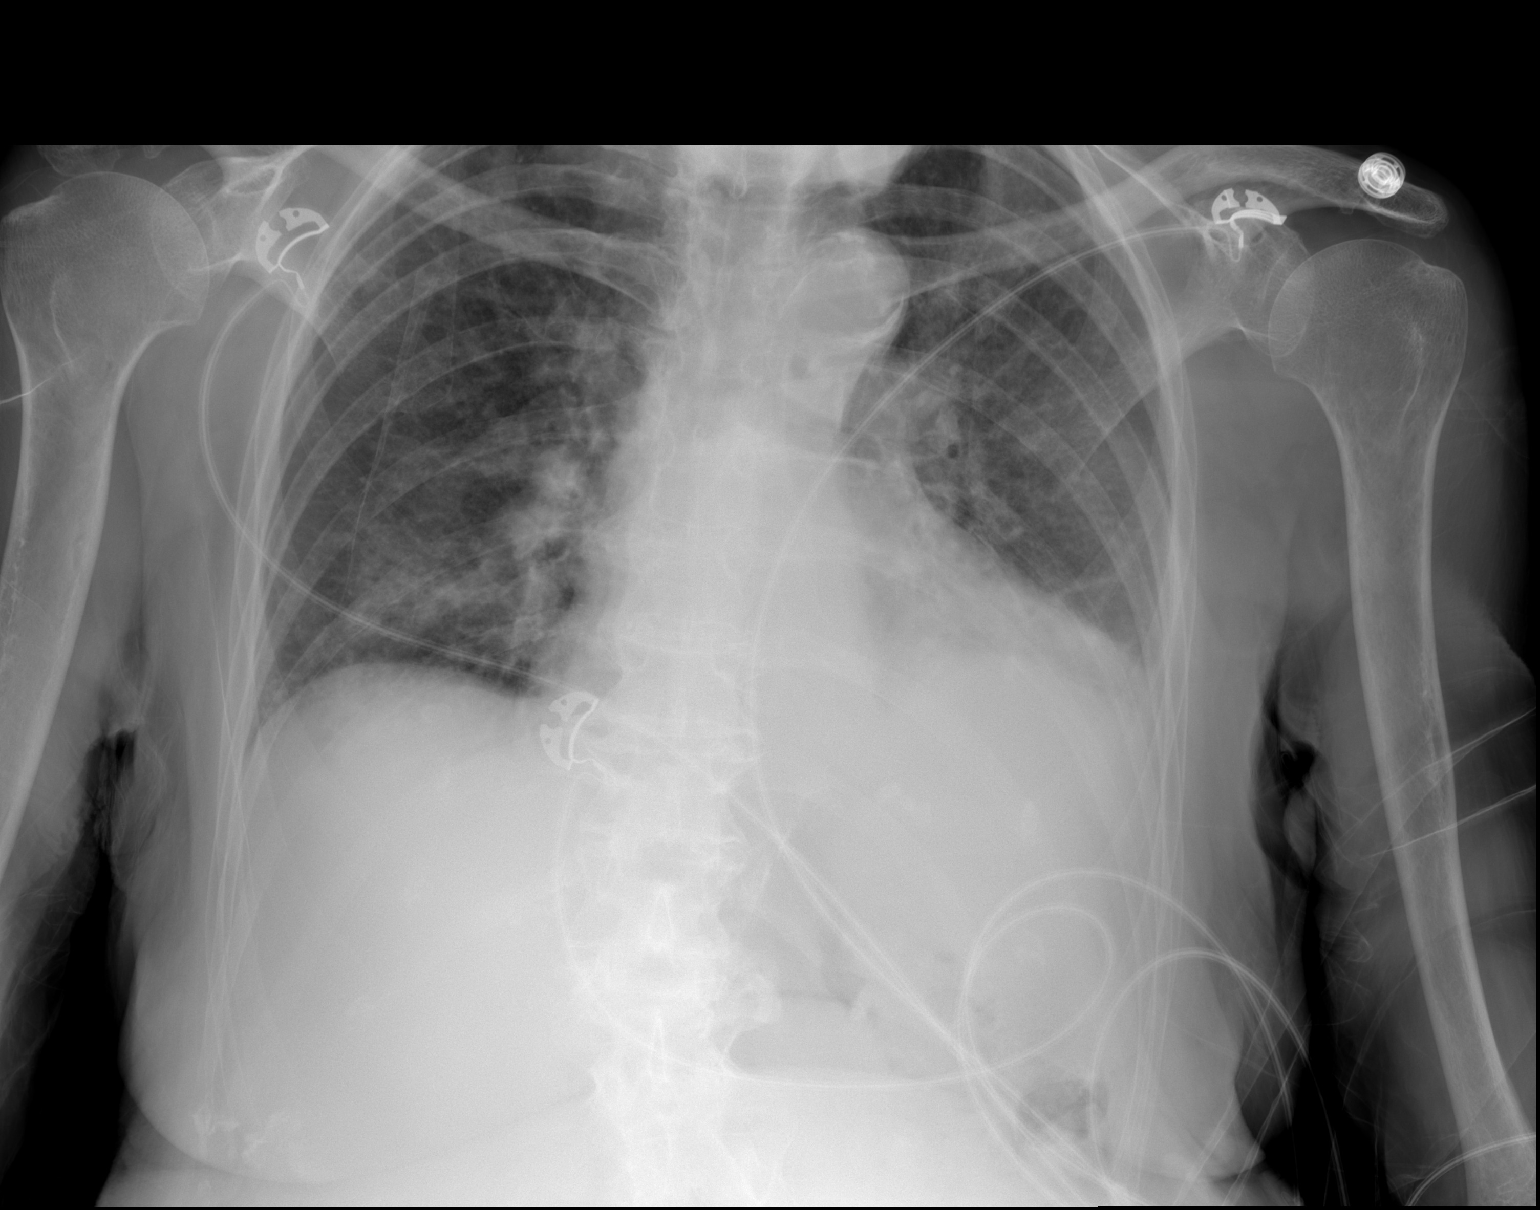

[w chest lat]
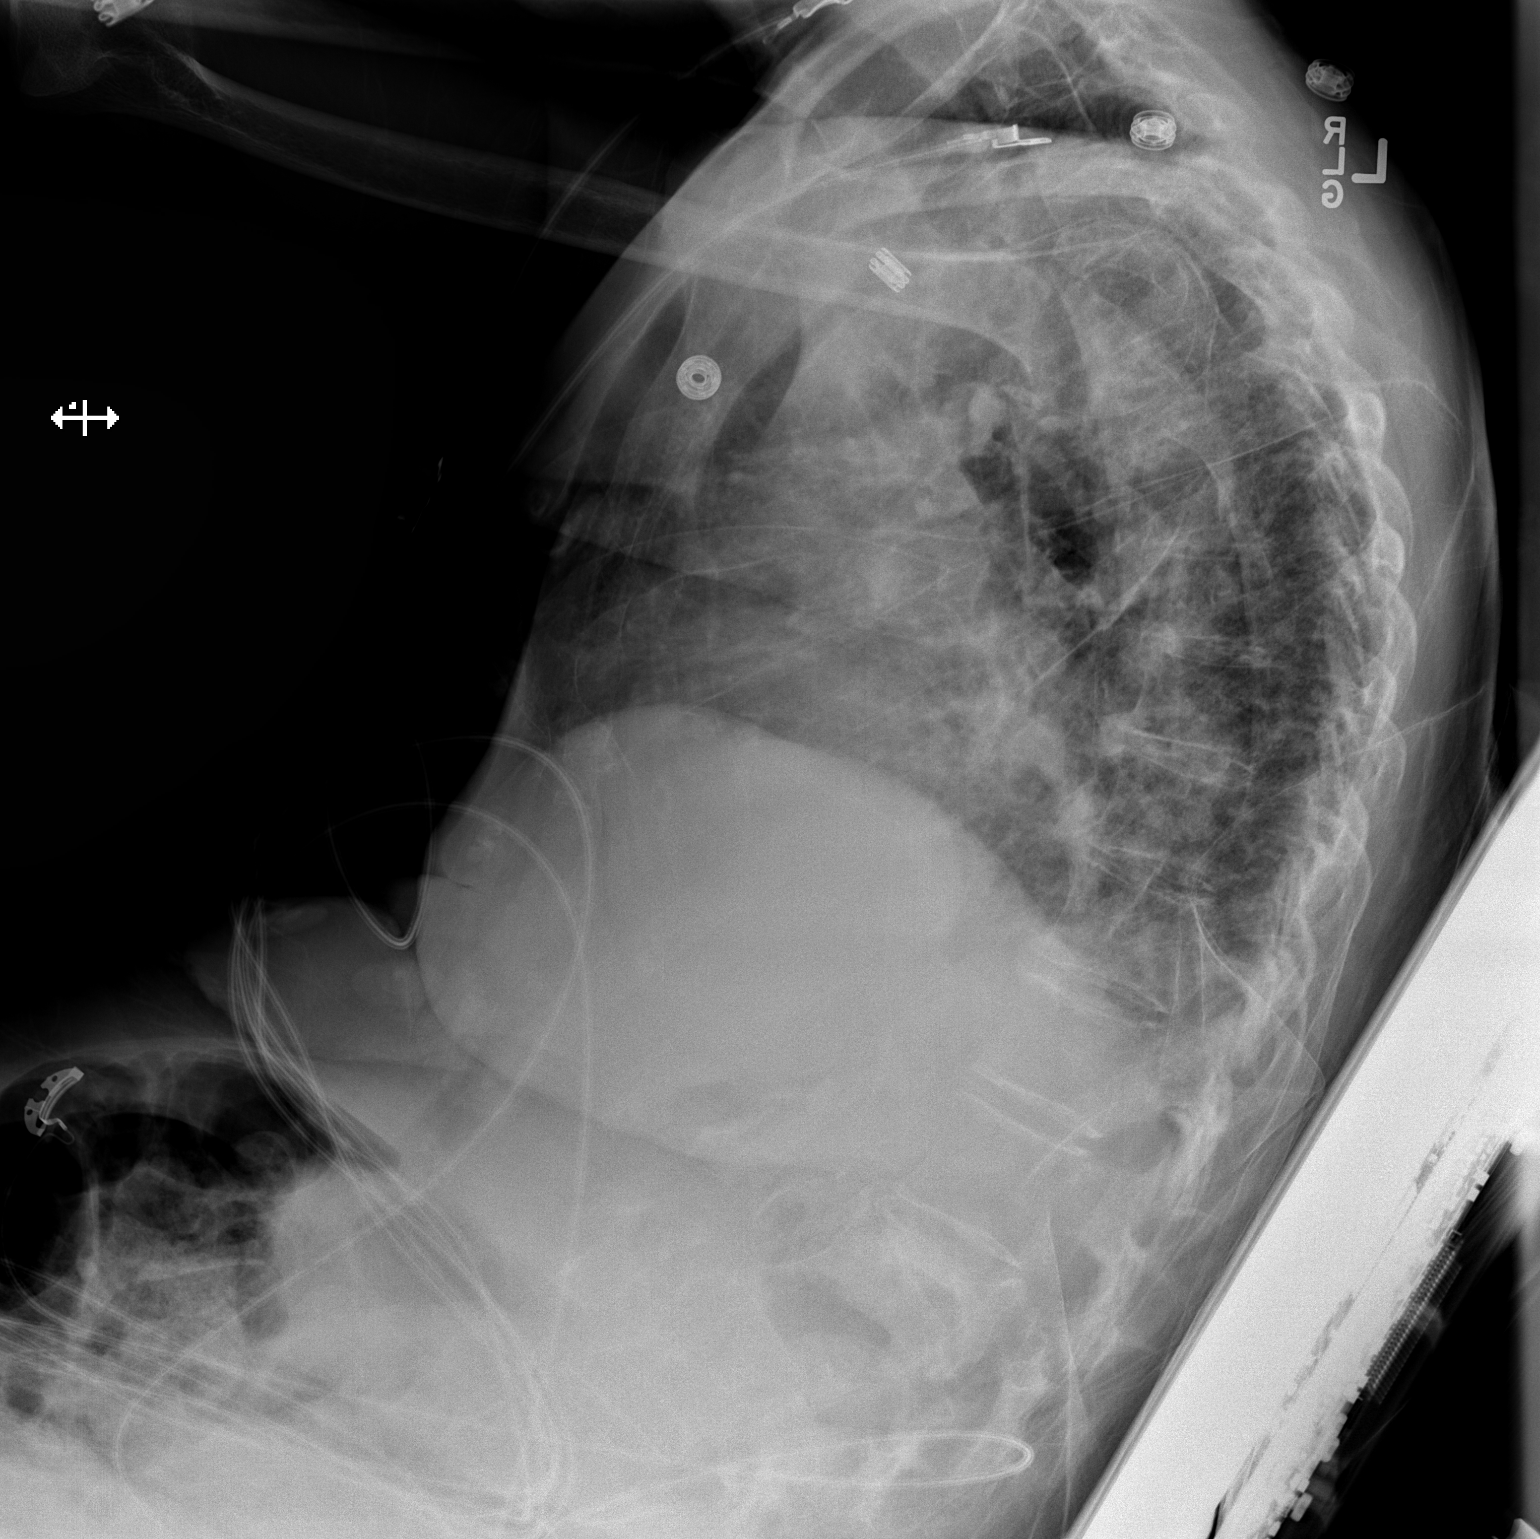

[2 of 2 positions shown; findings below may reference images not displayed]

FINDINGS: The heart is enlarged. Calcified tortuous aorta. BILATERAL pulmonary
opacities favored to represent early pulmonary edema. Elevated LEFT
hemidiaphragm, likely subsegmental atelectasis LEFT base. Definite
lobar consolidation not established. Small LEFT effusion. No
pneumothorax. No osseous findings.
IMPRESSION: Cardiomegaly, question early pulmonary edema. Worsening aeration
compared with priors.

Retrocardiac density favored to represent elevated LEFT
hemidiaphragm due to LEFT lower lobe subsegmental volume loss.

## 2017-11-21 DIAGNOSIS — I739 Peripheral vascular disease, unspecified: Secondary | ICD-10-CM | POA: Diagnosis not present

## 2017-11-21 DIAGNOSIS — B351 Tinea unguium: Secondary | ICD-10-CM | POA: Diagnosis not present

## 2017-12-24 DIAGNOSIS — D649 Anemia, unspecified: Secondary | ICD-10-CM | POA: Diagnosis not present

## 2017-12-24 DIAGNOSIS — I10 Essential (primary) hypertension: Secondary | ICD-10-CM | POA: Diagnosis not present

## 2017-12-24 DIAGNOSIS — R05 Cough: Secondary | ICD-10-CM | POA: Diagnosis not present

## 2017-12-29 DIAGNOSIS — R451 Restlessness and agitation: Secondary | ICD-10-CM | POA: Diagnosis not present

## 2017-12-29 DIAGNOSIS — E860 Dehydration: Secondary | ICD-10-CM | POA: Diagnosis not present

## 2017-12-29 DIAGNOSIS — J189 Pneumonia, unspecified organism: Secondary | ICD-10-CM | POA: Diagnosis not present

## 2017-12-30 DIAGNOSIS — D649 Anemia, unspecified: Secondary | ICD-10-CM | POA: Diagnosis not present

## 2017-12-30 DIAGNOSIS — Z471 Aftercare following joint replacement surgery: Secondary | ICD-10-CM | POA: Diagnosis not present

## 2017-12-30 DIAGNOSIS — F0390 Unspecified dementia without behavioral disturbance: Secondary | ICD-10-CM | POA: Diagnosis not present

## 2017-12-30 DIAGNOSIS — E878 Other disorders of electrolyte and fluid balance, not elsewhere classified: Secondary | ICD-10-CM | POA: Diagnosis not present

## 2017-12-30 DIAGNOSIS — R1312 Dysphagia, oropharyngeal phase: Secondary | ICD-10-CM | POA: Diagnosis not present

## 2017-12-30 DIAGNOSIS — G20 Parkinson's disease: Secondary | ICD-10-CM | POA: Diagnosis not present

## 2017-12-31 DIAGNOSIS — G20 Parkinson's disease: Secondary | ICD-10-CM | POA: Diagnosis not present

## 2017-12-31 DIAGNOSIS — R1312 Dysphagia, oropharyngeal phase: Secondary | ICD-10-CM | POA: Diagnosis not present

## 2017-12-31 DIAGNOSIS — F0390 Unspecified dementia without behavioral disturbance: Secondary | ICD-10-CM | POA: Diagnosis not present

## 2017-12-31 DIAGNOSIS — Z471 Aftercare following joint replacement surgery: Secondary | ICD-10-CM | POA: Diagnosis not present

## 2018-01-01 DIAGNOSIS — E878 Other disorders of electrolyte and fluid balance, not elsewhere classified: Secondary | ICD-10-CM | POA: Diagnosis not present

## 2018-01-01 DIAGNOSIS — D649 Anemia, unspecified: Secondary | ICD-10-CM | POA: Diagnosis not present

## 2018-01-01 DIAGNOSIS — I10 Essential (primary) hypertension: Secondary | ICD-10-CM | POA: Diagnosis not present

## 2018-01-02 DIAGNOSIS — Z471 Aftercare following joint replacement surgery: Secondary | ICD-10-CM | POA: Diagnosis not present

## 2018-01-02 DIAGNOSIS — R1312 Dysphagia, oropharyngeal phase: Secondary | ICD-10-CM | POA: Diagnosis not present

## 2018-01-02 DIAGNOSIS — G20 Parkinson's disease: Secondary | ICD-10-CM | POA: Diagnosis not present

## 2018-01-02 DIAGNOSIS — J189 Pneumonia, unspecified organism: Secondary | ICD-10-CM | POA: Diagnosis not present

## 2018-01-02 DIAGNOSIS — F0390 Unspecified dementia without behavioral disturbance: Secondary | ICD-10-CM | POA: Diagnosis not present

## 2018-01-10 DEATH — deceased

## 2018-02-02 IMAGING — DX DG CHEST 1V
1 series · 1 of 1 positions shown · non-contrast
Comparison: 03/09/2016.

CLINICAL DATA: Right hip fracture following a fall.

EXAM:
CHEST 1 VIEW

[chest ap]
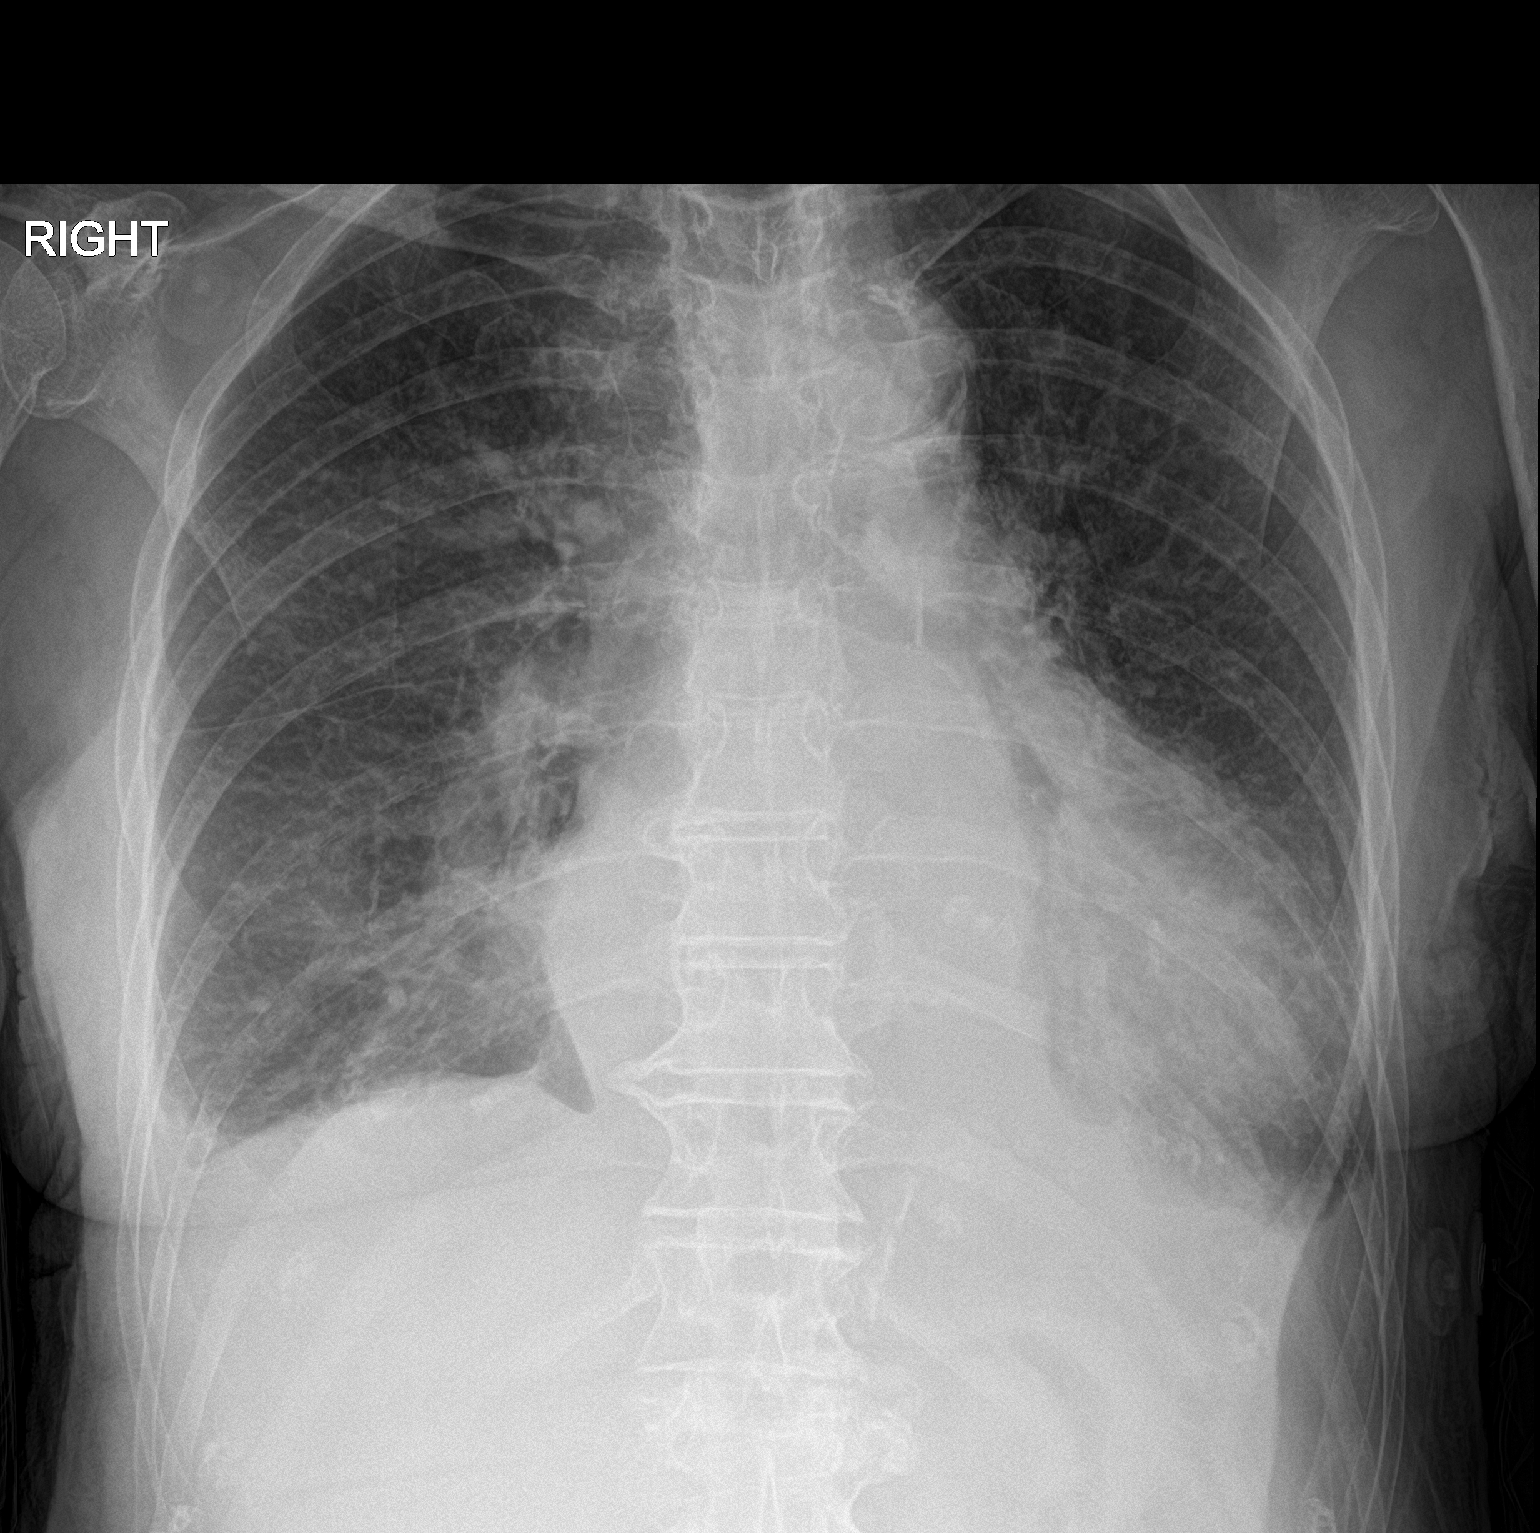

[1 of 1 positions shown; findings below may reference images not displayed]

FINDINGS: Stable enlarged cardiac silhouette. Prominent pulmonary vasculature
and interstitial markings with Kerley lines. Small bilateral pleural
effusions. Atheromatous aortic calcifications. Thoracic spine
degenerative changes.
IMPRESSION: 1. Cardiomegaly and changes of congestive heart failure.
2. Aortic atherosclerosis.

## 2018-02-04 IMAGING — DX DG PORTABLE PELVIS
1 series · 1 of 1 positions shown · non-contrast
Comparison: Right hip radiographs performed 05/27/2016

CLINICAL DATA: Status post right hip hemiarthroplasty. Initial
encounter.

EXAM:
PORTABLE PELVIS 1-2 VIEWS

[pelvis ap]
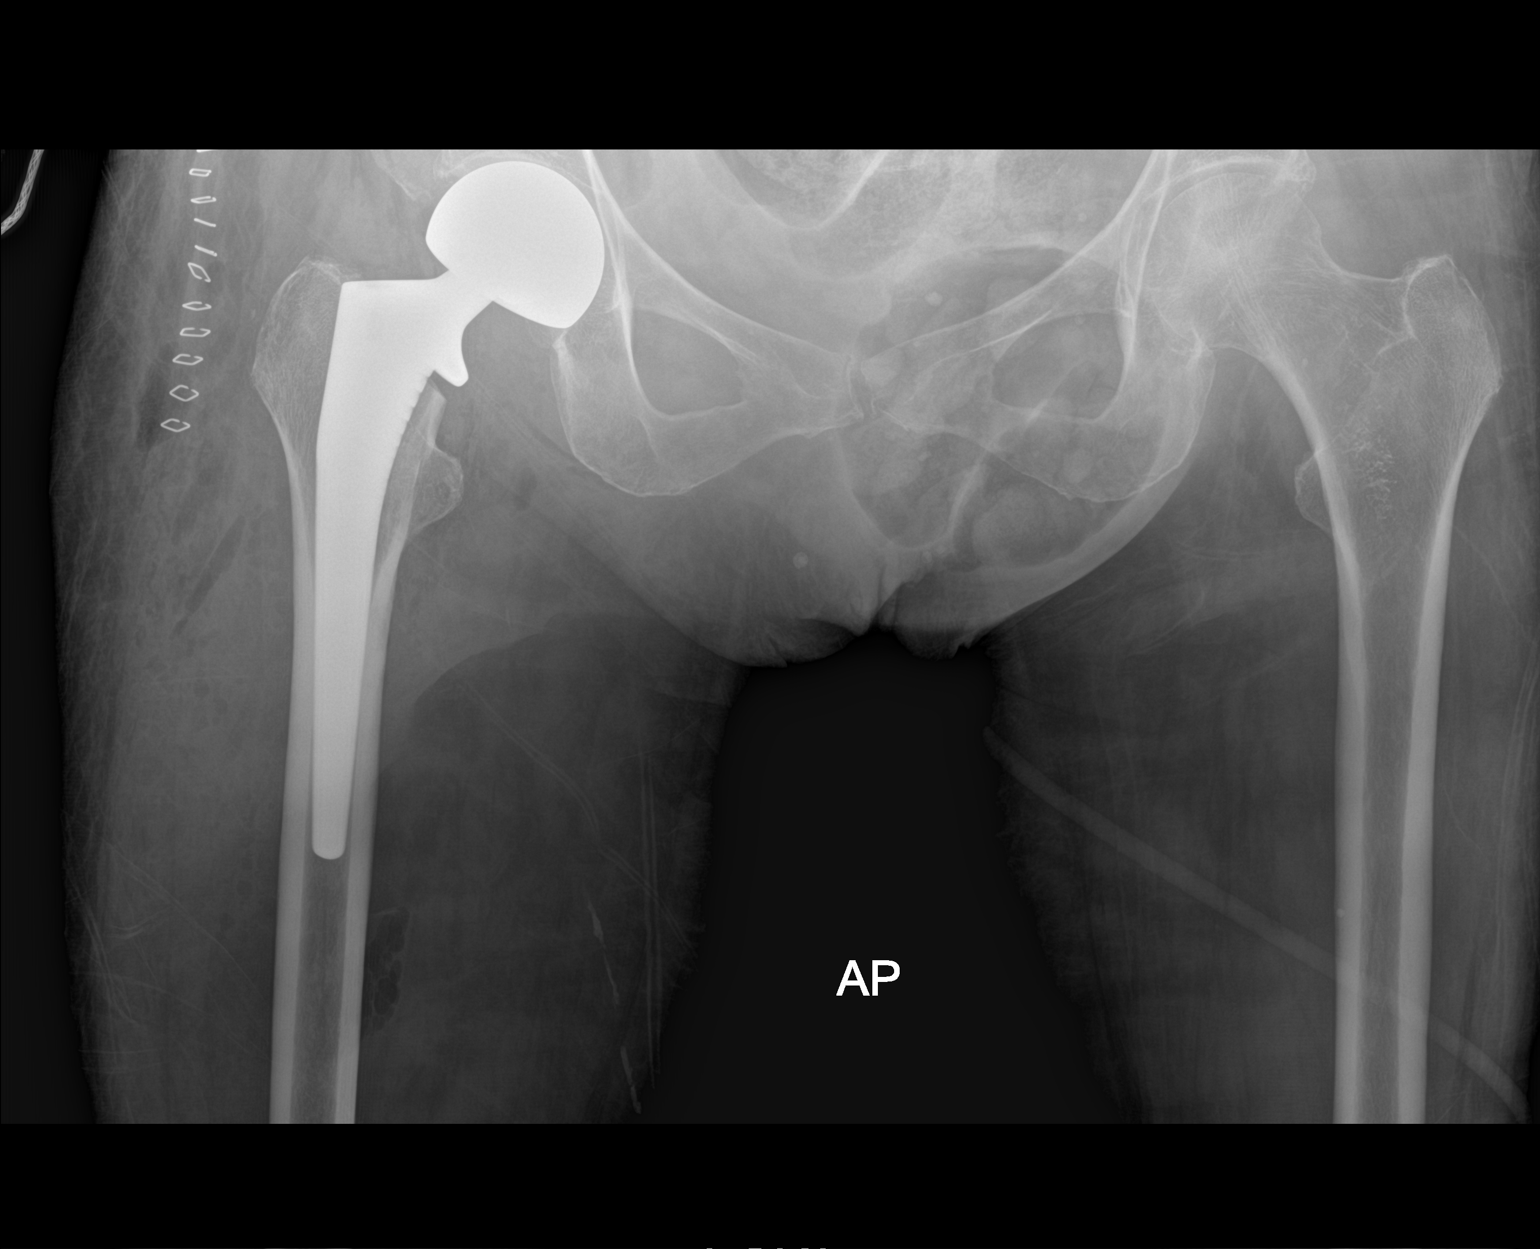

[1 of 1 positions shown; findings below may reference images not displayed]

FINDINGS: The patient's right hip hemiarthroplasty is grossly unremarkable in
appearance, without evidence of loosening. Overlying postoperative
change and soft tissue air are seen.

There is no evidence of fracture or dislocation. The left hip joint
is grossly unremarkable in appearance.
IMPRESSION: Right hip hemiarthroplasty is grossly unremarkable in appearance,
without evidence of loosening. No evidence of fracture.

## 2018-12-23 IMAGING — US US ABDOMEN LIMITED
1 series · 14 of 25 positions shown · non-contrast
Comparison: 04/08/2003 CT.

CLINICAL DATA: [AGE] female with elevated transaminases.
Initial encounter.

EXAM:
US ABDOMEN LIMITED - RIGHT UPPER QUADRANT

[Series 1: us abdomen limited · 0.23mm/px · 14 of 44 slices shown]
[im 1/44]
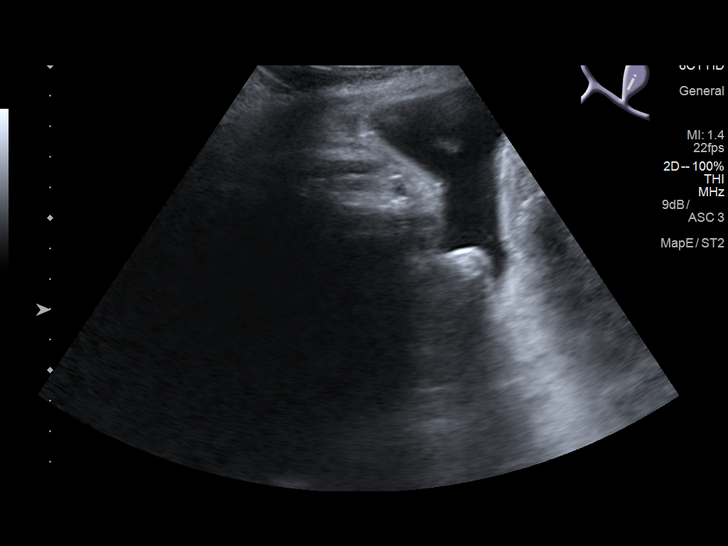
[im 4/44]
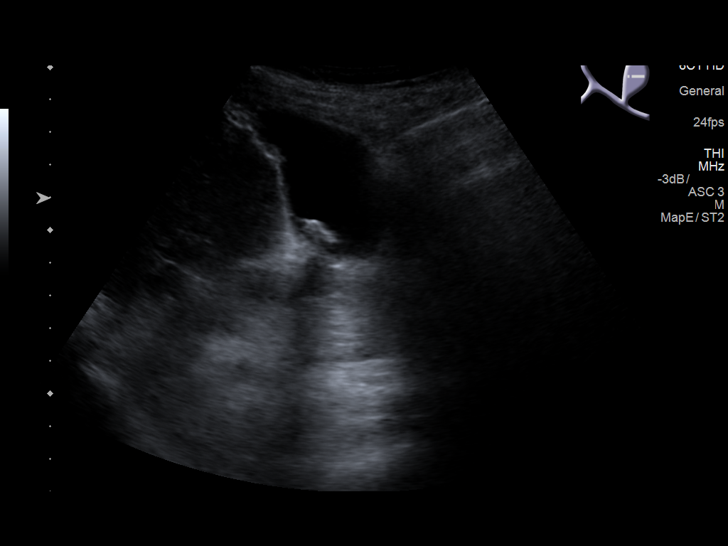
[im 8/44]
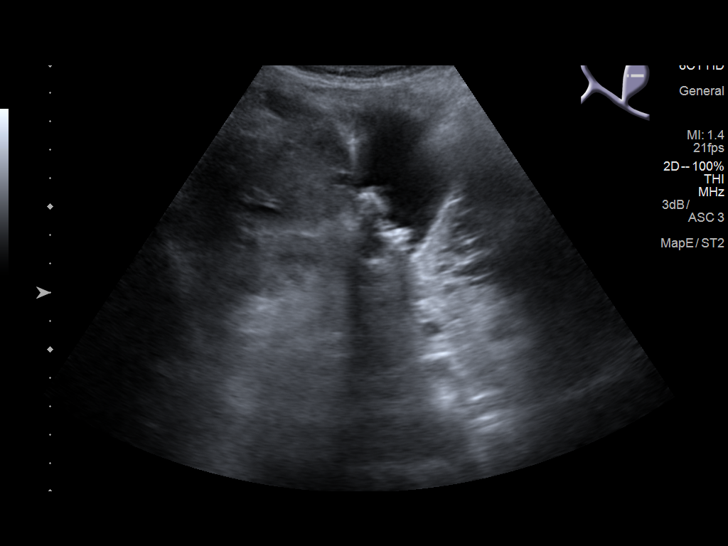
[im 11/44]
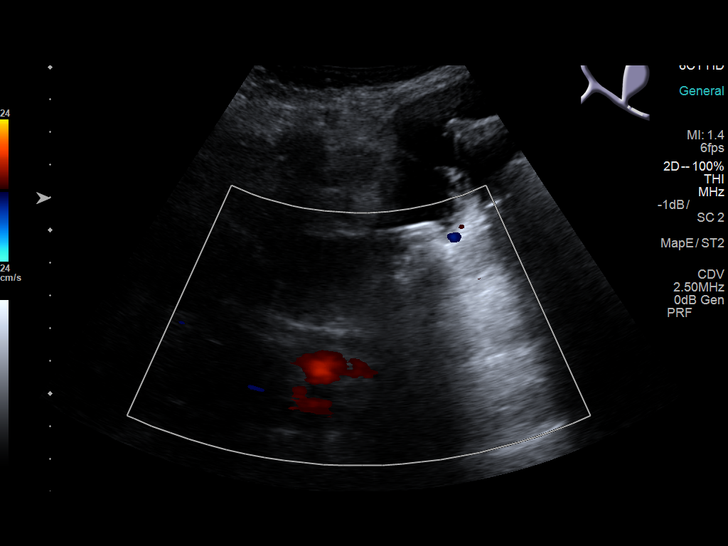
[im 15/44]
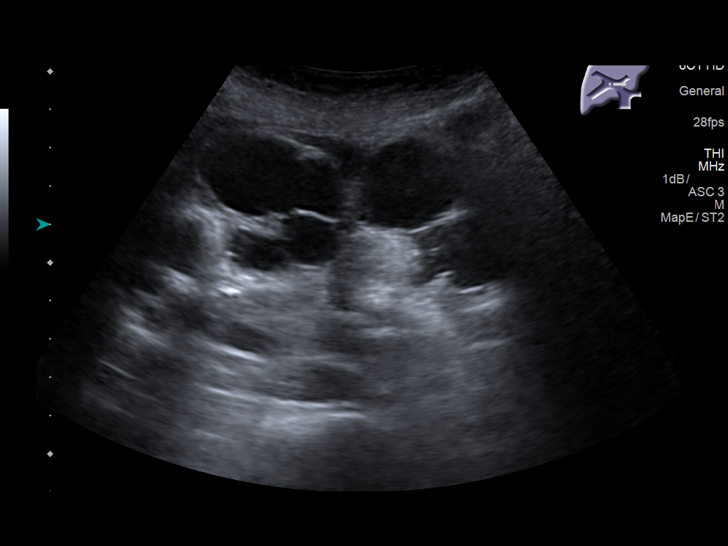
[im 17/44]
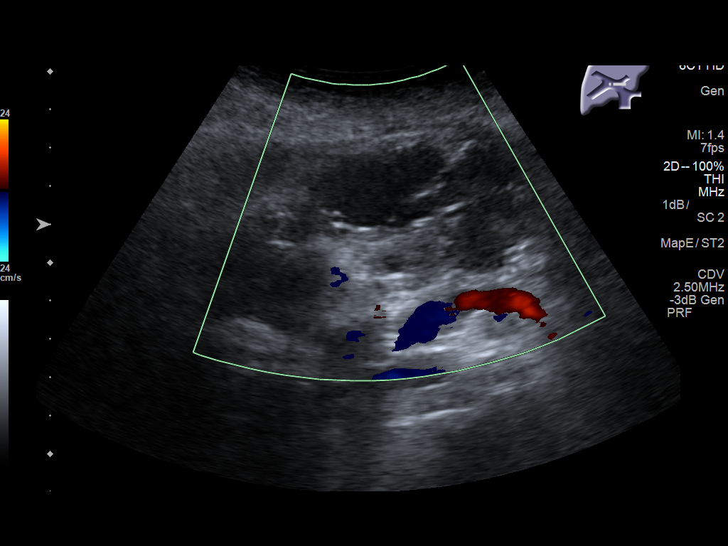
[im 20/44]
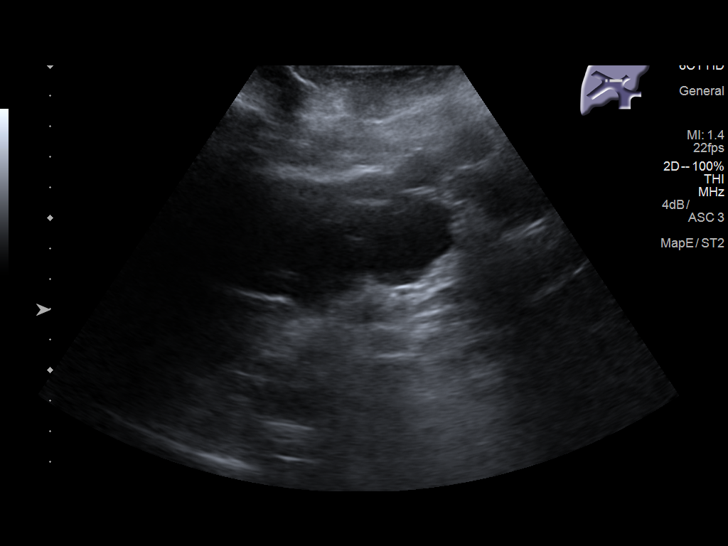
[im 24/44]
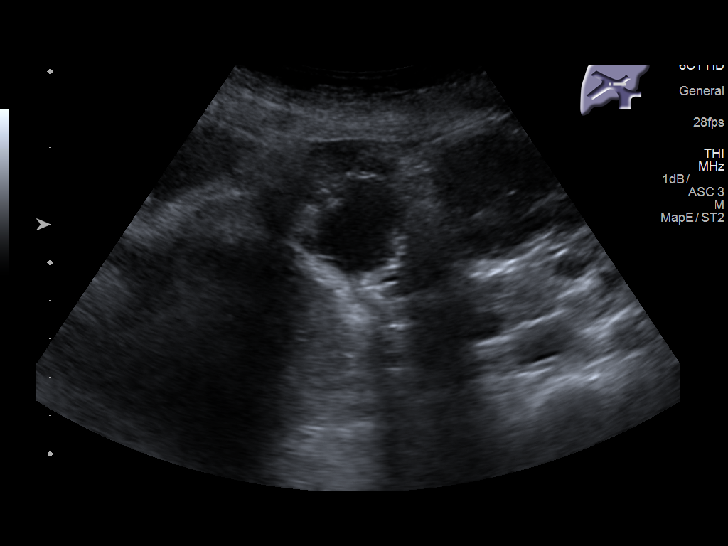
[im 27/44]
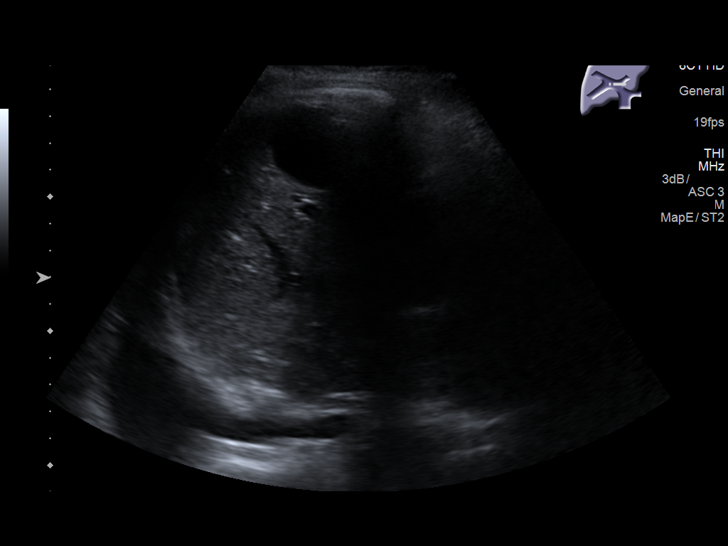
[im 29/44]
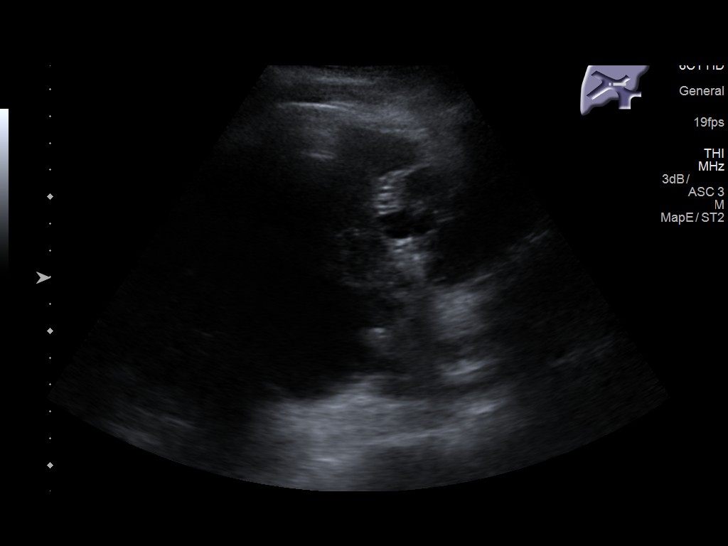
[im 33/44]
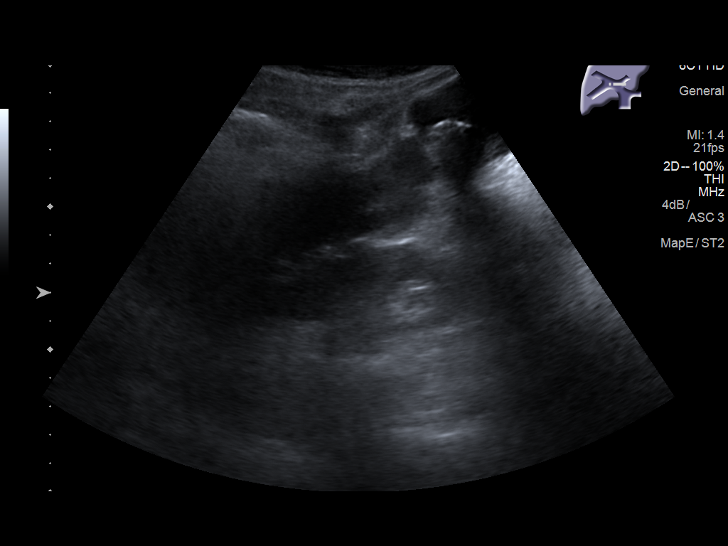
[im 36/44]
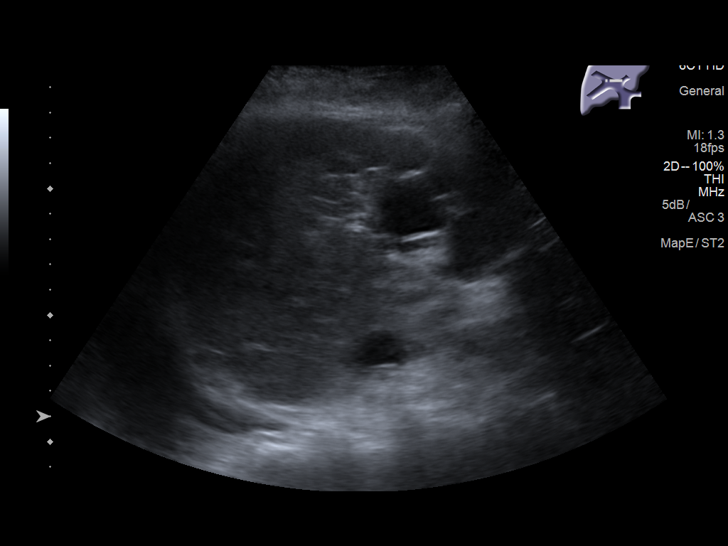
[im 40/44]
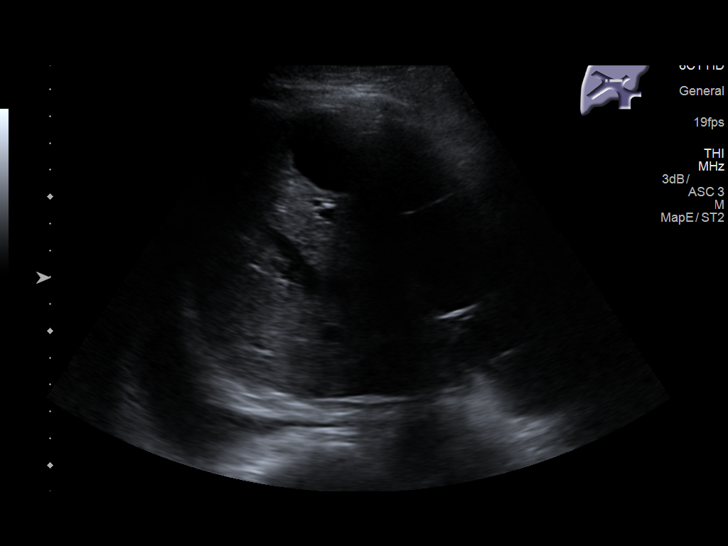
[im 44/44]
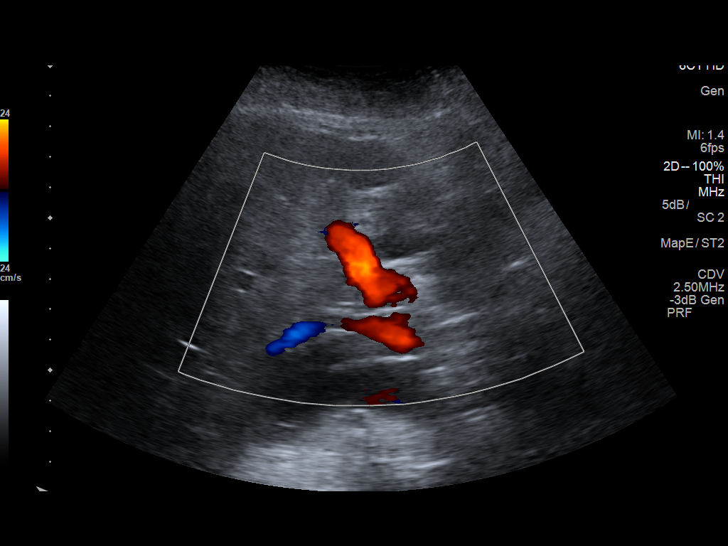

[14 of 25 positions shown; findings below may reference images not displayed]

FINDINGS: Gallbladder:

Gallstones measuring up to 2.7 cm. No obvious gallbladder wall
thickening. Patient was not tender over this region during scanning
per ultrasound technologist. Evaluation somewhat limited as patient
was not able to turn.

Common bile duct:

Diameter: 6 mm.

Liver:

Multiple liver cysts measuring up to 4.9 cm.

Right-sided pleural effusion for
IMPRESSION: Gallstones. No sonographic evidence of cholecystitis (evaluation
slightly limited).

Multiple liver cysts.

Right-sided pleural effusion.
